# Patient Record
Sex: Male | Born: 1945 | Race: White | Hispanic: No | Smoking: Never smoker
Health system: Southern US, Community
[De-identification: ages and names within clinical notes are randomized; demographics above are authoritative.]

## PROBLEM LIST (undated history)

## (undated) DIAGNOSIS — J9621 Acute and chronic respiratory failure with hypoxia: Secondary | ICD-10-CM

## (undated) DIAGNOSIS — G909 Disorder of the autonomic nervous system, unspecified: Secondary | ICD-10-CM

## (undated) DIAGNOSIS — A419 Sepsis, unspecified organism: Secondary | ICD-10-CM

## (undated) DIAGNOSIS — G610 Guillain-Barre syndrome: Secondary | ICD-10-CM

## (undated) DIAGNOSIS — I480 Paroxysmal atrial fibrillation: Secondary | ICD-10-CM

## (undated) DIAGNOSIS — C61 Malignant neoplasm of prostate: Secondary | ICD-10-CM

## (undated) DIAGNOSIS — R6520 Severe sepsis without septic shock: Secondary | ICD-10-CM

## (undated) DIAGNOSIS — I509 Heart failure, unspecified: Secondary | ICD-10-CM

## (undated) DIAGNOSIS — I5022 Chronic systolic (congestive) heart failure: Secondary | ICD-10-CM

---

## 2020-07-17 ENCOUNTER — Other Ambulatory Visit (HOSPITAL_COMMUNITY): Payer: No Typology Code available for payment source

## 2020-07-17 ENCOUNTER — Inpatient Hospital Stay
Admission: RE | Admit: 2020-07-17 | Discharge: 2020-07-23 | Discharge status: Against Medical Advice | Payer: No Typology Code available for payment source | Attending: Internal Medicine | Admitting: Internal Medicine

## 2020-07-17 DIAGNOSIS — A419 Sepsis, unspecified organism: Secondary | ICD-10-CM | POA: Diagnosis present

## 2020-07-17 DIAGNOSIS — G909 Disorder of the autonomic nervous system, unspecified: Secondary | ICD-10-CM | POA: Diagnosis present

## 2020-07-17 DIAGNOSIS — G610 Guillain-Barre syndrome: Secondary | ICD-10-CM | POA: Diagnosis present

## 2020-07-17 DIAGNOSIS — Z931 Gastrostomy status: Secondary | ICD-10-CM

## 2020-07-17 DIAGNOSIS — R6520 Severe sepsis without septic shock: Secondary | ICD-10-CM | POA: Diagnosis present

## 2020-07-17 DIAGNOSIS — J90 Pleural effusion, not elsewhere classified: Secondary | ICD-10-CM

## 2020-07-17 DIAGNOSIS — J9690 Respiratory failure, unspecified, unspecified whether with hypoxia or hypercapnia: Secondary | ICD-10-CM

## 2020-07-17 DIAGNOSIS — J9621 Acute and chronic respiratory failure with hypoxia: Secondary | ICD-10-CM | POA: Diagnosis present

## 2020-07-17 DIAGNOSIS — I480 Paroxysmal atrial fibrillation: Secondary | ICD-10-CM | POA: Diagnosis present

## 2020-07-17 DIAGNOSIS — R6889 Other general symptoms and signs: Secondary | ICD-10-CM

## 2020-07-17 DIAGNOSIS — J9601 Acute respiratory failure with hypoxia: Secondary | ICD-10-CM | POA: Diagnosis present

## 2020-07-17 HISTORY — DX: Sepsis, unspecified organism: R65.20

## 2020-07-17 HISTORY — DX: Disorder of the autonomic nervous system, unspecified: G90.9

## 2020-07-17 HISTORY — DX: Guillain-Barre syndrome: G61.0

## 2020-07-17 HISTORY — DX: Paroxysmal atrial fibrillation: I48.0

## 2020-07-17 HISTORY — DX: Acute and chronic respiratory failure with hypoxia: J96.21

## 2020-07-17 HISTORY — DX: Sepsis, unspecified organism: A41.9

## 2020-07-18 DIAGNOSIS — G909 Disorder of the autonomic nervous system, unspecified: Secondary | ICD-10-CM | POA: Diagnosis not present

## 2020-07-18 DIAGNOSIS — I480 Paroxysmal atrial fibrillation: Secondary | ICD-10-CM | POA: Diagnosis not present

## 2020-07-18 DIAGNOSIS — J9621 Acute and chronic respiratory failure with hypoxia: Secondary | ICD-10-CM | POA: Diagnosis not present

## 2020-07-18 DIAGNOSIS — G610 Guillain-Barre syndrome: Secondary | ICD-10-CM | POA: Diagnosis not present

## 2020-07-18 LAB — CBC WITH DIFFERENTIAL/PLATELET
Abs Immature Granulocytes: 0.04 10*3/uL (ref 0.00–0.07)
Basophils Absolute: 0 10*3/uL (ref 0.0–0.1)
Basophils Relative: 1 %
Eosinophils Absolute: 0.3 10*3/uL (ref 0.0–0.5)
Eosinophils Relative: 5 %
HCT: 23.7 % — ABNORMAL LOW (ref 39.0–52.0)
Hemoglobin: 7.4 g/dL — ABNORMAL LOW (ref 13.0–17.0)
Immature Granulocytes: 1 %
Lymphocytes Relative: 25 %
Lymphs Abs: 1.4 10*3/uL (ref 0.7–4.0)
MCH: 29.1 pg (ref 26.0–34.0)
MCHC: 31.2 g/dL (ref 30.0–36.0)
MCV: 93.3 fL (ref 80.0–100.0)
Monocytes Absolute: 0.4 10*3/uL (ref 0.1–1.0)
Monocytes Relative: 7 %
Neutro Abs: 3.4 10*3/uL (ref 1.7–7.7)
Neutrophils Relative %: 61 %
Platelets: 189 10*3/uL (ref 150–400)
RBC: 2.54 MIL/uL — ABNORMAL LOW (ref 4.22–5.81)
RDW: 16.1 % — ABNORMAL HIGH (ref 11.5–15.5)
WBC: 5.5 10*3/uL (ref 4.0–10.5)
nRBC: 0 % (ref 0.0–0.2)

## 2020-07-18 LAB — TSH: TSH: 5.335 u[IU]/mL — ABNORMAL HIGH (ref 0.350–4.500)

## 2020-07-18 LAB — COMPREHENSIVE METABOLIC PANEL
ALT: 48 U/L — ABNORMAL HIGH (ref 0–44)
AST: 57 U/L — ABNORMAL HIGH (ref 15–41)
Albumin: 2.1 g/dL — ABNORMAL LOW (ref 3.5–5.0)
Alkaline Phosphatase: 54 U/L (ref 38–126)
Anion gap: 9 (ref 5–15)
BUN: 30 mg/dL — ABNORMAL HIGH (ref 8–23)
CO2: 24 mmol/L (ref 22–32)
Calcium: 7.9 mg/dL — ABNORMAL LOW (ref 8.9–10.3)
Chloride: 111 mmol/L (ref 98–111)
Creatinine, Ser: 0.8 mg/dL (ref 0.61–1.24)
GFR, Estimated: >60 mL/min (ref >60)
Glucose, Bld: 89 mg/dL (ref 70–99)
Potassium: 3.6 mmol/L (ref 3.5–5.1)
Sodium: 144 mmol/L (ref 135–145)
Total Bilirubin: 0.8 mg/dL (ref 0.3–1.2)
Total Protein: 5.1 g/dL — ABNORMAL LOW (ref 6.5–8.1)

## 2020-07-18 LAB — PROTIME-INR
INR: 1.2 (ref 0.8–1.2)
Prothrombin Time: 14.8 seconds (ref 11.4–15.2)

## 2020-07-18 LAB — HEMOGLOBIN A1C
Hgb A1c MFr Bld: 5.3 % (ref 4.8–5.6)
Mean Plasma Glucose: 105.41 mg/dL

## 2020-07-18 LAB — VANCOMYCIN, TROUGH
Vancomycin Tr: 29 ug/mL (ref 15–20)
Vancomycin Tr: 22 ug/mL (ref 15–20)

## 2020-07-18 LAB — PHOSPHORUS: Phosphorus: 2.9 mg/dL (ref 2.5–4.6)

## 2020-07-18 LAB — MAGNESIUM: Magnesium: 2.2 mg/dL (ref 1.7–2.4)

## 2020-07-18 NOTE — Consult Note (Signed)
Pulmonary Critical Care Medicine [facility] [place]  PULMONARY SERVICE  Date of Service: 07/18/2020  PULMONARY CRITICAL CARE CONSULT   Joseph Mayo  INO:[id number]  DOB: [birth year]   DOA: 07/17/2020  Referring Physician: Carron Curie, MD  HPI: Joseph Mayo is a 74 y.o. male seen for follow up of Acute on Chronic Respiratory Failure.  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  Patient at that time had been tested positive for COVID-19 on October 1 and had received MAB for treatment.  Patient's condition worsened with development of quadriparesis loss of DTRs and ended up having to be intubated.  Patient was transferred to [facility] neuro ICU on October 27.  The evaluation was done there by neurology.  Patient was given IVIG and also treated with Plex x7 treatments.  Patient did have some slight improvement noted based on the notes.  Had an echocardiogram done which the only report that I can find shows normal ejection fraction of 60 to 65%.  Patient did however have new onset of paroxysmal atrial fibrillation for which the patient was started on Coreg.  PEG tube was placed because of ongoing dysphagia.  The patient also became septic with MSSA and Serratia initially treated with vancomycin and Zosyn also was felt to have aspiration.  Subsequently it appears patient received meropenem as well as vancomycin follow-up.  Transferred to our facility for further weaning.  On initial evaluation he is off the ventilator on T collar has been on 35% FiO2.  He has a Bivona trach in place and looking through the charts from [place] it appears that the tracheostomy was done required division of the thyroid isthmus.  Currently he is doing fine the Bivona is sticking out quite a bit may need to change to a regular  Shiley we will discuss with the team during rounds and with ENT.  Review of Systems:  ROS performed and is unremarkable other than noted above.  Past Medical History:  Diagnosis Date  . A-fib (*) 06/25/2020  . Acute respiratory failure with hypoxia (*) 06/25/2020  . AKI (acute kidney injury) (*) 06/25/2020  . Bradycardia  . Elevated blood pressure reading without diagnosis of hypertension  . Essential hypertension 06/25/2020  . Hematuria  . Thrombocytopenia (*)   History reviewed. No pertinent surgical history.  No Known Allergies  Socioeconomic History  . Marital status: Married  Spouse name: Not on file  . Number of children: Not on file  . Years of education: Not on file  . Highest education level: Not on file  Occupational History  . Not on file  Tobacco Use  . Smoking status: Never Smoker  . Smokeless tobacco: Never Used  Vaping Use  . Vaping Use: Never used  Substance and Sexual Activity  . Alcohol use: Never  . Drug use: Never  . Sexual activity: Not on file  Other Topics Concern  . Not on file  Social History Narrative  . Not on file     Family History: Non-Contributory to the present illness    Medications: Reviewed on Rounds  Physical Exam:  Vitals: Temperature is 97.8 pulse 72 respiratory 14 blood pressure is 142/78 saturations 97%  Ventilator Settings off the ventilator on T collar with an FiO2 of 28%  . General: Comfortable at this time . Eyes: Grossly normal lids, irises & conjunctiva . ENT:  grossly tongue is normal . Neck: no obvious mass . Cardiovascular: S1-S2 normal no gallop or rub . Respiratory: No rhonchi no rales noted . Abdomen: Soft nontender . Skin: no rash seen on limited exam . Musculoskeletal: not rigid . Psychiatric:unable to assess . Neurologic: no seizure no involuntary movements         Labs on Admission:  Basic Metabolic Panel: Recent Labs  Lab 07/18/20 0555  MG 2.2  PHOS 2.9    No results for input(s): PHART,  PCO2ART, PO2ART, HCO3, O2SAT in the last 168 hours.  Liver Function Tests: No results for input(s): AST, ALT, ALKPHOS, BILITOT, PROT, ALBUMIN in the last 168 hours. No results for input(s): LIPASE, AMYLASE in the last 168 hours. No results for input(s): AMMONIA in the last 168 hours.  CBC: No results for input(s): WBC, NEUTROABS, HGB, HCT, MCV, PLT in the last 168 hours.  Cardiac Enzymes: No results for input(s): CKTOTAL, CKMB, CKMBINDEX, TROPONINI in the last 168 hours.  BNP (last 3 results) No results for input(s): BNP in the last 8760 hours.  ProBNP (last 3 results) No results for input(s): PROBNP in the last 8760 hours.   Radiological Exams on Admission: DG CHEST PORT 1 VIEW  Result Date: 07/17/2020 CLINICAL DATA:  74 year old male with respiratory failure. EXAM: PORTABLE CHEST 1 VIEW COMPARISON:  None. FINDINGS: Tracheostomy approximately 7 cm above the carina. There is cardiomegaly with vascular congestion and edema. Small bilateral pleural effusions with bibasilar atelectasis or infiltrate. No pneumothorax. No acute osseous pathology. IMPRESSION: 1. Cardiomegaly with CHF and small bilateral pleural effusions. 2. Tracheostomy above the carina. Electronically Signed   By: Elgie Collard M.D.   On: 07/17/2020 18:54   DG Abd Portable 1V  Result Date: 07/17/2020 CLINICAL DATA:  Percutaneous gastrostomy tube placement. EXAM: PORTABLE ABDOMEN - 1 VIEW COMPARISON:  None. FINDINGS: A percutaneous gastrostomy tube is seen with its distal end and insufflator bulb overlying the body of the stomach. The bowel gas pattern is normal. Radiopaque contrast is seen within the gastric lumen and proximal duodenum following injection into the previously noted gastrostomy tube. There is no evidence of contrast extravasation. No radio-opaque calculi or other significant radiographic abnormality are seen. IMPRESSION: Appropriate percutaneous gastrostomy tube positioning, as described above.  Electronically Signed   By: Aram Candela M.D.   On: 07/17/2020 21:37    Assessment/Plan Active Problems:   Acute on chronic respiratory failure with hypoxia (HCC)   Guillain Barr syndrome (HCC)   Paroxysmal atrial fibrillation (HCC)   Autonomic dysfunction   Severe sepsis (HCC)   1. Acute on chronic respiratory failure hypoxia patient remains off the ventilator on T collar currently on 28% FiO2 copious secretions are noted. 2. Atrial fibrillation with RVR rate is controlled we will continue to monitor. 3. 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  Completed 7 plasma exchanges 4. Autonomic dysfunction slow improvement we will continue to monitor patient's pressures closely 5. Severe sepsis recently treated for Serratia UTI and aspiration pneumonia we will continue to monitor  I have personally seen and evaluated the patient, evaluated laboratory and imaging results, formulated the assessment and plan and placed orders. The Patient requires high complexity decision making with multiple systems involvement.  Case was discussed on Rounds with the Respiratory Therapy Director and the Respiratory staff Time Spent  Yevonne Pax, MD [facility] Pulmonary Critical Care Medicine Sleep Medicine

## 2020-07-19 LAB — URINALYSIS, ROUTINE W REFLEX MICROSCOPIC
Bilirubin Urine: NEGATIVE
Glucose, UA: NEGATIVE mg/dL
Ketones, ur: NEGATIVE mg/dL
Leukocytes,Ua: NEGATIVE
Nitrite: NEGATIVE
Protein, ur: 100 mg/dL — AB
Specific Gravity, Urine: 1.016 (ref 1.005–1.030)
pH: 5 (ref 5.0–8.0)

## 2020-07-19 LAB — RENAL FUNCTION PANEL
Albumin: 2.1 g/dL — ABNORMAL LOW (ref 3.5–5.0)
Anion gap: 10 (ref 5–15)
BUN: 29 mg/dL — ABNORMAL HIGH (ref 8–23)
CO2: 26 mmol/L (ref 22–32)
Calcium: 8.1 mg/dL — ABNORMAL LOW (ref 8.9–10.3)
Chloride: 109 mmol/L (ref 98–111)
Creatinine, Ser: 0.74 mg/dL (ref 0.61–1.24)
GFR, Estimated: >60 mL/min (ref >60)
Glucose, Bld: 123 mg/dL — ABNORMAL HIGH (ref 70–99)
Phosphorus: 2.8 mg/dL (ref 2.5–4.6)
Potassium: 3.5 mmol/L (ref 3.5–5.1)
Sodium: 145 mmol/L (ref 135–145)

## 2020-07-19 LAB — VANCOMYCIN, TROUGH: Vancomycin Tr: 16 ug/mL (ref 15–20)

## 2020-07-19 LAB — CULTURE, RESPIRATORY W GRAM STAIN: Culture: NORMAL

## 2020-07-19 LAB — CBC
HCT: 24 % — ABNORMAL LOW (ref 39.0–52.0)
Hemoglobin: 7.5 g/dL — ABNORMAL LOW (ref 13.0–17.0)
MCH: 29.3 pg (ref 26.0–34.0)
MCHC: 31.3 g/dL (ref 30.0–36.0)
MCV: 93.8 fL (ref 80.0–100.0)
Platelets: 206 10*3/uL (ref 150–400)
RBC: 2.56 MIL/uL — ABNORMAL LOW (ref 4.22–5.81)
RDW: 15.9 % — ABNORMAL HIGH (ref 11.5–15.5)
WBC: 5.6 10*3/uL (ref 4.0–10.5)
nRBC: 0 % (ref 0.0–0.2)

## 2020-07-19 LAB — BLOOD GAS, ARTERIAL
Acid-Base Excess: 3.7 mmol/L — ABNORMAL HIGH (ref 0.0–2.0)
Bicarbonate: 27.1 mmol/L (ref 20.0–28.0)
FIO2: 60
O2 Saturation: 99.8 %
Patient temperature: 37.6
pCO2 arterial: 37.5 mmHg (ref 32.0–48.0)
pH, Arterial: 7.475 — ABNORMAL HIGH (ref 7.350–7.450)
pO2, Arterial: 207 mmHg — ABNORMAL HIGH (ref 83.0–108.0)

## 2020-07-19 LAB — MAGNESIUM: Magnesium: 2.1 mg/dL (ref 1.7–2.4)

## 2020-07-20 ENCOUNTER — Encounter: Payer: Self-pay | Admitting: Diagnostic Radiology

## 2020-07-20 DIAGNOSIS — J9601 Acute respiratory failure with hypoxia: Secondary | ICD-10-CM | POA: Diagnosis present

## 2020-07-20 DIAGNOSIS — R6520 Severe sepsis without septic shock: Secondary | ICD-10-CM

## 2020-07-20 DIAGNOSIS — G909 Disorder of the autonomic nervous system, unspecified: Secondary | ICD-10-CM | POA: Diagnosis present

## 2020-07-20 DIAGNOSIS — I480 Paroxysmal atrial fibrillation: Secondary | ICD-10-CM | POA: Diagnosis not present

## 2020-07-20 DIAGNOSIS — J9621 Acute and chronic respiratory failure with hypoxia: Secondary | ICD-10-CM | POA: Diagnosis present

## 2020-07-20 DIAGNOSIS — G610 Guillain-Barre syndrome: Secondary | ICD-10-CM | POA: Diagnosis present

## 2020-07-20 DIAGNOSIS — A419 Sepsis, unspecified organism: Secondary | ICD-10-CM

## 2020-07-20 LAB — BASIC METABOLIC PANEL
Anion gap: 7 (ref 5–15)
BUN: 26 mg/dL — ABNORMAL HIGH (ref 8–23)
CO2: 28 mmol/L (ref 22–32)
Calcium: 8.2 mg/dL — ABNORMAL LOW (ref 8.9–10.3)
Chloride: 111 mmol/L (ref 98–111)
Creatinine, Ser: 0.78 mg/dL (ref 0.61–1.24)
GFR, Estimated: >60 mL/min (ref >60)
Glucose, Bld: 118 mg/dL — ABNORMAL HIGH (ref 70–99)
Potassium: 4 mmol/L (ref 3.5–5.1)
Sodium: 146 mmol/L — ABNORMAL HIGH (ref 135–145)

## 2020-07-20 LAB — URINE CULTURE: Culture: NO GROWTH

## 2020-07-20 LAB — PHOSPHORUS: Phosphorus: 3.1 mg/dL (ref 2.5–4.6)

## 2020-07-20 LAB — CBC
HCT: 25 % — ABNORMAL LOW (ref 39.0–52.0)
Hemoglobin: 7.7 g/dL — ABNORMAL LOW (ref 13.0–17.0)
MCH: 28.9 pg (ref 26.0–34.0)
MCHC: 30.8 g/dL (ref 30.0–36.0)
MCV: 94 fL (ref 80.0–100.0)
Platelets: 211 10*3/uL (ref 150–400)
RBC: 2.66 MIL/uL — ABNORMAL LOW (ref 4.22–5.81)
RDW: 16 % — ABNORMAL HIGH (ref 11.5–15.5)
WBC: 6.3 10*3/uL (ref 4.0–10.5)
nRBC: 0 % (ref 0.0–0.2)

## 2020-07-20 LAB — MAGNESIUM: Magnesium: 2 mg/dL (ref 1.7–2.4)

## 2020-07-20 NOTE — Progress Notes (Signed)
Pulmonary Critical Care Medicine [facility] [place]   PULMONARY CRITICAL CARE SERVICE  PROGRESS NOTE  Date of Service: 07/20/2020  Joseph Mayo  [id number]:[id number]  DOB: 07-27-1946   DOA: 07/17/2020  Referring Physician: Carron Curie, MD  HPI: Joseph Mayo is a 74 y.o. male seen for follow up of Acute on Chronic Respiratory Failure.  Patient is off the ventilator right now on T-piece has been on 55% FiO2 secretions are still quite copious  Medications: Reviewed on Rounds  Physical Exam:  Vitals: Temperature 98.4 pulse 92 respiratory rate is 16 blood pressure is 136/79 saturations 94%  Ventilator Settings on T-piece with an FiO2 of 55%  . General: Comfortable at this time . Eyes: Grossly normal lids, irises & conjunctiva . ENT: grossly tongue is normal . Neck: no obvious mass . Cardiovascular: S1 S2 normal no gallop . Respiratory: Scattered rhonchi coarse breath sounds . Abdomen: soft . Skin: no rash seen on limited exam . Musculoskeletal: not rigid . Psychiatric:unable to assess . Neurologic: no seizure no involuntary movements         Lab Data:   Basic Metabolic Panel: Recent Labs  Lab 07/18/20 0555 07/18/20 0905 07/19/20 0310 07/20/20 0010  NA  --  144 145 146*  K  --  3.6 3.5 4.0  CL  --  111 109 111  CO2  --  24 26 28   GLUCOSE  --  89 123* 118*  BUN  --  30* 29* 26*  CREATININE  --  0.80 0.74 0.78  CALCIUM  --  7.9* 8.1* 8.2*  MG 2.2  --  2.1 2.0  PHOS 2.9  --  2.8 3.1    ABG: Recent Labs  Lab 07/19/20 0325  PHART 7.475*  PCO2ART 37.5  PO2ART 207*  HCO3 27.1  O2SAT 99.8    Liver Function Tests: Recent Labs  Lab 07/18/20 0905 07/19/20 0310  AST 57*  --   ALT 48*  --   ALKPHOS 54  --   BILITOT 0.8  --   PROT 5.1*  --   ALBUMIN 2.1* 2.1*   No results for input(s): LIPASE, AMYLASE in the last 168 hours. No results for input(s): AMMONIA in the last 168 hours.  CBC: Recent Labs  Lab 07/18/20 0905 07/19/20 0310  07/20/20 0010  WBC 5.5 5.6 6.3  NEUTROABS 3.4  --   --   HGB 7.4* 7.5* 7.7*  HCT 23.7* 24.0* 25.0*  MCV 93.3 93.8 94.0  PLT 189 206 211    Cardiac Enzymes: No results for input(s): CKTOTAL, CKMB, CKMBINDEX, TROPONINI in the last 168 hours.  BNP (last 3 results) No results for input(s): BNP in the last 8760 hours.  ProBNP (last 3 results) No results for input(s): PROBNP in the last 8760 hours.  Radiological Exams: No results found.  Assessment/Plan Active Problems:   Acute on chronic respiratory failure with hypoxia (HCC)   Guillain Barr syndrome (HCC)   Paroxysmal atrial fibrillation (HCC)   Autonomic dysfunction   Severe sepsis (HCC)   1. Acute on chronic respiratory failure with hypoxia at this time patient is on T-piece has been doing relatively well plan is going to be to continue to wean the oxygen down as tolerated. 2. Atrial fibrillation rate is controlled at this time we will continue to monitor 3. Guillain-Barr syndrome supportive care therapy as tolerated. 4. Autonomic dysfunction has been more stable this is secondary to the Guillain-Barr syndrome with slow improvement 5. Severe sepsis treated resolved  I have personally seen and evaluated the patient, evaluated laboratory and imaging results, formulated the assessment and plan and placed orders. The Patient requires high complexity decision making with multiple systems involvement.  Rounds were done with the Respiratory Therapy Director and Staff therapists and discussed with nursing staff also.  Allyne Gee, MD [facility] Pulmonary Critical Care Medicine Sleep Medicine

## 2020-07-21 ENCOUNTER — Other Ambulatory Visit (HOSPITAL_COMMUNITY): Payer: No Typology Code available for payment source

## 2020-07-21 DIAGNOSIS — J9621 Acute and chronic respiratory failure with hypoxia: Secondary | ICD-10-CM | POA: Diagnosis not present

## 2020-07-21 DIAGNOSIS — G610 Guillain-Barre syndrome: Secondary | ICD-10-CM | POA: Diagnosis not present

## 2020-07-21 DIAGNOSIS — G909 Disorder of the autonomic nervous system, unspecified: Secondary | ICD-10-CM | POA: Diagnosis not present

## 2020-07-21 DIAGNOSIS — I480 Paroxysmal atrial fibrillation: Secondary | ICD-10-CM | POA: Diagnosis not present

## 2020-07-21 LAB — SARS CORONAVIRUS 2 (TAT 6-24 HRS): SARS Coronavirus 2: NEGATIVE

## 2020-07-21 LAB — VITAMIN B12: Vitamin B-12: 335 pg/mL (ref 180–914)

## 2020-07-21 LAB — ECHOCARDIOGRAM COMPLETE
Area-P 1/2: 3.21 cm2
S' Lateral: 3.51 cm

## 2020-07-21 LAB — SODIUM: Sodium: 137 mmol/L (ref 135–145)

## 2020-07-21 LAB — BRAIN NATRIURETIC PEPTIDE: B Natriuretic Peptide: 310.8 pg/mL — ABNORMAL HIGH (ref 0.0–100.0)

## 2020-07-21 NOTE — Progress Notes (Signed)
Pulmonary Critical Care Medicine [facility] [place]   PULMONARY CRITICAL CARE SERVICE  PROGRESS NOTE  Date of Service: 07/21/2020  Joseph Mayo  LFY:[id number]  DOB: 22-Sep-1945   DOA: 07/17/2020  Referring Physician: Carron Curie, MD  HPI: Joseph Mayo is a 74 y.o. male seen for follow up of Acute on Chronic Respiratory Failure.  Comfortable right now on T collar has been on 35% FiO2 good saturations are noted.  Heart rate has been under control and FiO2 has been decreased from 55 to 35%  Medications: Reviewed on Rounds  Physical Exam:  Vitals: Temperature is 98.2 pulse 76 respiratory rate is 26 blood pressure is 152/83 saturations 100%  Ventilator Settings off the ventilator on T collar  . General: Comfortable at this time . Eyes: Grossly normal lids, irises & conjunctiva . ENT: grossly tongue is normal . Neck: no obvious mass . Cardiovascular: S1 S2 normal no gallop . Respiratory: No rhonchi coarse breath sounds . Abdomen: soft . Skin: no rash seen on limited exam . Musculoskeletal: not rigid . Psychiatric:unable to assess . Neurologic: no seizure no involuntary movements         Lab Data:   Basic Metabolic Panel: Recent Labs  Lab 07/18/20 0555 07/18/20 0905 07/19/20 0310 07/20/20 0010 07/21/20 0543  NA  --  144 145 146* 137  K  --  3.6 3.5 4.0  --   CL  --  111 109 111  --   CO2  --  24 26 28   --   GLUCOSE  --  89 123* 118*  --   BUN  --  30* 29* 26*  --   CREATININE  --  0.80 0.74 0.78  --   CALCIUM  --  7.9* 8.1* 8.2*  --   MG 2.2  --  2.1 2.0  --   PHOS 2.9  --  2.8 3.1  --     ABG: Recent Labs  Lab 07/19/20 0325  PHART 7.475*  PCO2ART 37.5  PO2ART 207*  HCO3 27.1  O2SAT 99.8    Liver Function Tests: Recent Labs  Lab 07/18/20 0905 07/19/20 0310  AST 57*  --   ALT 48*  --   ALKPHOS 54  --   BILITOT 0.8  --   PROT 5.1*  --   ALBUMIN 2.1* 2.1*   No results for input(s): LIPASE, AMYLASE in the last 168  hours. No results for input(s): AMMONIA in the last 168 hours.  CBC: Recent Labs  Lab 07/18/20 0905 07/19/20 0310 07/20/20 0010  WBC 5.5 5.6 6.3  NEUTROABS 3.4  --   --   HGB 7.4* 7.5* 7.7*  HCT 23.7* 24.0* 25.0*  MCV 93.3 93.8 94.0  PLT 189 206 211    Cardiac Enzymes: No results for input(s): CKTOTAL, CKMB, CKMBINDEX, TROPONINI in the last 168 hours.  BNP (last 3 results) Recent Labs    07/21/20 0543  BNP 310.8*    ProBNP (last 3 results) No results for input(s): PROBNP in the last 8760 hours.  Radiological Exams: DG CHEST PORT 1 VIEW  Result Date: 07/21/2020 CLINICAL DATA:  Pleural effusion.  Increased oxygen demand EXAM: PORTABLE CHEST 1 VIEW COMPARISON:  Four days ago FINDINGS: Tracheostomy tube in place. Symmetric hazy opacification of the bilateral lower chest. No visible air leak. Normal heart size. IMPRESSION: Unchanged hazy opacification of the bilateral lower chest, usually atelectasis and pleural effusions. Electronically Signed   By: 07/23/2020 M.D.   On:  07/21/2020 06:37    Assessment/Plan Active Problems:   Acute on chronic respiratory failure with hypoxia (HCC)   Guillain Barr syndrome (HCC)   Paroxysmal atrial fibrillation (HCC)   Autonomic dysfunction   Severe sepsis (HCC)   1. Acute on chronic respiratory failure hypoxia plan is to continue with the weaning process patient is doing quite well FiO2 has been decreased to 35%.  The patient tracheostomy will be changed we will monitor closely. 2. Guillain-Barr syndrome supportive care therapy as tolerated 3. Paroxysmal atrial fibrillation rate is controlled we will continue to monitor. 4. Autonomic dysfunction patient's hemodynamics are better controlled 5. Severe sepsis resolved   I have personally seen and evaluated the patient, evaluated laboratory and imaging results, formulated the assessment and plan and placed orders. The Patient requires high complexity decision making with multiple  systems involvement.  Rounds were done with the Respiratory Therapy Director and Staff therapists and discussed with nursing staff also.  Yevonne Pax, MD [facility] Pulmonary Critical Care Medicine Sleep Medicine

## 2020-07-21 NOTE — Progress Notes (Signed)
  Echocardiogram 2D Echocardiogram has been performed.  Joseph Mayo G Devonne Lalani 07/21/2020, 2:51 PM

## 2020-07-22 ENCOUNTER — Encounter (HOSPITAL_BASED_OUTPATIENT_CLINIC_OR_DEPARTMENT_OTHER): Payer: No Typology Code available for payment source

## 2020-07-22 DIAGNOSIS — M7989 Other specified soft tissue disorders: Secondary | ICD-10-CM

## 2020-07-22 DIAGNOSIS — G610 Guillain-Barre syndrome: Secondary | ICD-10-CM | POA: Diagnosis not present

## 2020-07-22 DIAGNOSIS — I480 Paroxysmal atrial fibrillation: Secondary | ICD-10-CM | POA: Diagnosis not present

## 2020-07-22 DIAGNOSIS — J9621 Acute and chronic respiratory failure with hypoxia: Secondary | ICD-10-CM | POA: Diagnosis not present

## 2020-07-22 DIAGNOSIS — G909 Disorder of the autonomic nervous system, unspecified: Secondary | ICD-10-CM | POA: Diagnosis not present

## 2020-07-22 DIAGNOSIS — J9620 Acute and chronic respiratory failure, unspecified whether with hypoxia or hypercapnia: Secondary | ICD-10-CM

## 2020-07-22 LAB — CBC
HCT: 28.5 % — ABNORMAL LOW (ref 39.0–52.0)
Hemoglobin: 8.8 g/dL — ABNORMAL LOW (ref 13.0–17.0)
MCH: 28.9 pg (ref 26.0–34.0)
MCHC: 30.9 g/dL (ref 30.0–36.0)
MCV: 93.4 fL (ref 80.0–100.0)
Platelets: 236 10*3/uL (ref 150–400)
RBC: 3.05 MIL/uL — ABNORMAL LOW (ref 4.22–5.81)
RDW: 15.7 % — ABNORMAL HIGH (ref 11.5–15.5)
WBC: 7.4 10*3/uL (ref 4.0–10.5)
nRBC: 0 % (ref 0.0–0.2)

## 2020-07-22 LAB — BASIC METABOLIC PANEL
Anion gap: 9 (ref 5–15)
BUN: 22 mg/dL (ref 8–23)
CO2: 27 mmol/L (ref 22–32)
Calcium: 8.5 mg/dL — ABNORMAL LOW (ref 8.9–10.3)
Chloride: 104 mmol/L (ref 98–111)
Creatinine, Ser: 0.64 mg/dL (ref 0.61–1.24)
GFR, Estimated: >60 mL/min (ref >60)
Glucose, Bld: 113 mg/dL — ABNORMAL HIGH (ref 70–99)
Potassium: 4.1 mmol/L (ref 3.5–5.1)
Sodium: 140 mmol/L (ref 135–145)

## 2020-07-22 LAB — MAGNESIUM: Magnesium: 2 mg/dL (ref 1.7–2.4)

## 2020-07-22 NOTE — Progress Notes (Signed)
IR received request for thoracentesis. The patient's daughter Mc Bloodworth was contacted to obtain consent. Rhoda stated she does NOT give consent and she is requesting a second opinion about the necessity of this procedure. The ordering provider was notified of this request.  No IR procedure planned and the order will be deleted.  Please call IR with any questions/concerns.  Alwyn Ren, [place] [phone] 07/22/2020, 1:01 PM

## 2020-07-22 NOTE — Progress Notes (Signed)
Pulmonary Critical Care Medicine [facility] [place]   PULMONARY CRITICAL CARE SERVICE  PROGRESS NOTE  Date of Service: 07/22/2020  Joseph Mayo  WYO:[id number]  DOB: Aug 19, 1946   DOA: 07/17/2020  Referring Physician: Carron Curie, MD  HPI: Joseph Mayo is a 74 y.o. male seen for follow up of Acute on Chronic Respiratory Failure.  Patient remains on long T collar has been on 30% FiO2 FiO2 has been gradually decreased  Medications: Reviewed on Rounds  Physical Exam:  Vitals: Temperature 98.2 pulse 73 respiratory rate 22 blood pressure is 172/90 saturations 99%  Ventilator Settings off the ventilator on T collar with an FiO2 of 30%  . General: Comfortable at this time . Eyes: Grossly normal lids, irises & conjunctiva . ENT: grossly tongue is normal . Neck: no obvious mass . Cardiovascular: S1 S2 normal no gallop . Respiratory: No rhonchi very coarse breath sounds . Abdomen: soft . Skin: no rash seen on limited exam . Musculoskeletal: not rigid . Psychiatric:unable to assess . Neurologic: no seizure no involuntary movements         Lab Data:   Basic Metabolic Panel: Recent Labs  Lab 07/18/20 0555 07/18/20 0905 07/19/20 0310 07/20/20 0010 07/21/20 0543 07/22/20 0359  NA  --  144 145 146* 137 140  K  --  3.6 3.5 4.0  --  4.1  CL  --  111 109 111  --  104  CO2  --  24 26 28   --  27  GLUCOSE  --  89 123* 118*  --  113*  BUN  --  30* 29* 26*  --  22  CREATININE  --  0.80 0.74 0.78  --  0.64  CALCIUM  --  7.9* 8.1* 8.2*  --  8.5*  MG 2.2  --  2.1 2.0  --  2.0  PHOS 2.9  --  2.8 3.1  --   --     ABG: Recent Labs  Lab 07/19/20 0325  PHART 7.475*  PCO2ART 37.5  PO2ART 207*  HCO3 27.1  O2SAT 99.8    Liver Function Tests: Recent Labs  Lab 07/18/20 0905 07/19/20 0310  AST 57*  --   ALT 48*  --   ALKPHOS 54  --   BILITOT 0.8  --   PROT 5.1*  --   ALBUMIN 2.1* 2.1*   No results for input(s): LIPASE, AMYLASE in the last 168  hours. No results for input(s): AMMONIA in the last 168 hours.  CBC: Recent Labs  Lab 07/18/20 0905 07/19/20 0310 07/20/20 0010 07/22/20 0359  WBC 5.5 5.6 6.3 7.4  NEUTROABS 3.4  --   --   --   HGB 7.4* 7.5* 7.7* 8.8*  HCT 23.7* 24.0* 25.0* 28.5*  MCV 93.3 93.8 94.0 93.4  PLT 189 206 211 236    Cardiac Enzymes: No results for input(s): CKTOTAL, CKMB, CKMBINDEX, TROPONINI in the last 168 hours.  BNP (last 3 results) Recent Labs    07/21/20 0543  BNP 310.8*    ProBNP (last 3 results) No results for input(s): PROBNP in the last 8760 hours.  Radiological Exams: DG CHEST PORT 1 VIEW  Result Date: 07/21/2020 CLINICAL DATA:  Pleural effusion.  Increased oxygen demand EXAM: PORTABLE CHEST 1 VIEW COMPARISON:  Four days ago FINDINGS: Tracheostomy tube in place. Symmetric hazy opacification of the bilateral lower chest. No visible air leak. Normal heart size. IMPRESSION: Unchanged hazy opacification of the bilateral lower chest, usually atelectasis and pleural  effusions. Electronically Signed   By: Marnee Spring M.D.   On: 07/21/2020 06:37   ECHOCARDIOGRAM COMPLETE  Result Date: 07/21/2020    ECHOCARDIOGRAM REPORT   Patient Name:   Joseph Mayo Date of Exam: 07/21/2020 Medical Rec #:  [id number]          Height: Accession #:    [id number]         Weight: Date of Birth:  04-28-1946           BSA: Patient Age:    74 years           BP:           143/98 mmHg Patient Gender: M                  HR:           73 bpm. 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                  Arrythmias:Atrial Fibrillation. Sepsis. Guillain Barre                  Syndrome.  Sonographer:     Elmarie Shiley Dance Referring Phys:  [id number] PRIYA VARGHESE Diagnosing Phys: Orpah Cobb MD  Sonographer Comments: Echo performed with  patient supine and on artificial respirator and no parasternal window. IMPRESSIONS  1. Left ventricular ejection fraction, by estimation, is 45 to 50%. The left ventricle has mildly decreased function. The left ventricle demonstrates regional wall motion abnormalities (see scoring diagram/findings for description). Left ventricular diastolic parameters are consistent with Grade I diastolic dysfunction (impaired relaxation).  2. Right ventricular systolic function is normal. The right ventricular size is normal.  3. Left atrial size was moderately dilated.  4. The mitral valve is degenerative. Mild mitral valve regurgitation.  5. The aortic valve is tricuspid. Aortic valve regurgitation is not visualized. Mild aortic valve sclerosis is present, with no evidence of aortic valve stenosis.  6. The inferior vena cava is normal in size with <50% respiratory variability, suggesting right atrial pressure of 8 mmHg. FINDINGS  Left Ventricle: Left ventricular ejection fraction, by estimation, is 45 to 50%. The left ventricle has mildly decreased function. The left ventricle demonstrates regional wall motion abnormalities. Mild hypokinesis of the left ventricular, mid-apical anterior wall. The left ventricular internal cavity size was small. There is no left ventricular hypertrophy. Left ventricular diastolic parameters are consistent with Grade I diastolic dysfunction (impaired relaxation). Right Ventricle: The right ventricular size is normal. No increase in right ventricular wall thickness. Right ventricular systolic function is normal. Left Atrium: Left atrial size was moderately dilated. Right Atrium: Right atrial size was normal in size. Pericardium: There is no evidence of pericardial effusion. Mitral Valve: The mitral valve is degenerative in appearance. Mild mitral valve regurgitation. Tricuspid Valve: The tricuspid valve is normal in structure. Tricuspid valve regurgitation is trivial. Aortic Valve: The aortic valve  is tricuspid. Aortic valve regurgitation is not visualized. Mild aortic valve sclerosis is present, with no evidence of aortic valve stenosis. Pulmonic Valve: The pulmonic valve was normal in structure. Pulmonic valve regurgitation is not visualized. Aorta: The aortic root is normal in size and structure. There is minimal (Grade I) atheroma plaque involving the aortic root. Venous: The inferior vena  cava is normal in size with less than 50% respiratory variability, suggesting right atrial pressure of 8 mmHg. IAS/Shunts: The interatrial septum was not assessed. Additional Comments: There is a small pleural effusion in the right lateral region.  LEFT VENTRICLE PLAX 2D LVIDd:         4.64 cm  Diastology LVIDs:         3.51 cm  LV e' medial:    9.03 cm/s LV PW:         0.95 cm  LV E/e' medial:  9.0 LV IVS:        1.08 cm  LV e' lateral:   9.90 cm/s LVOT diam:     1.80 cm  LV E/e' lateral: 8.2 LV SV:         46 LVOT Area:     2.54 cm  RIGHT VENTRICLE             IVC RV Basal diam:  1.80 cm     IVC diam: 1.97 cm RV S prime:     11.40 cm/s TAPSE (M-mode): 2.2 cm LEFT ATRIUM             RIGHT ATRIUM LA diam:        3.50 cm RA Area:     9.61 cm LA Vol (A2C):   59.9 ml RA Volume:   19.50 ml LA Vol (A4C):   69.1 ml LA Biplane Vol: 66.0 ml  AORTIC VALVE LVOT Vmax:   77.60 cm/s LVOT Vmean:  50.500 cm/s LVOT VTI:    0.182 m  AORTA Ao Root diam: 3.70 cm MITRAL VALVE MV Area (PHT): 3.21 cm    SHUNTS MV Decel Time: 236 msec    Systemic VTI:  0.18 m MV E velocity: 81.00 cm/s  Systemic Diam: 1.80 cm MV A velocity: 72.80 cm/s MV E/A ratio:  1.11 Orpah Cobb MD Electronically signed by Orpah Cobb MD Signature Date/Time: 07/21/2020/3:15:59 PM    Final     Assessment/Plan Active Problems:   Acute on chronic respiratory failure with hypoxia (HCC)   Guillain Barr syndrome (HCC)   Paroxysmal atrial fibrillation (HCC)   Autonomic dysfunction   Severe sepsis (HCC)   1. Acute on chronic respiratory failure hypoxia we will  continue with T collar trials patient is on 30% FiO2. 2. Guillain-Barr syndrome patient still with significant weakness continue with therapy as tolerated. 3. Paroxysmal atrial fibrillation rate controlled right now on the echocardiogram that was done did show reduction in ejection fraction below 50% awaiting final report 4. Autonomic dysfunction right now appears to be stable from a blood pressure perspective 5. Severe sepsis this is resolved   I have personally seen and evaluated the patient, evaluated laboratory and imaging results, formulated the assessment and plan and placed orders. The Patient requires high complexity decision making with multiple systems involvement.  Rounds were done with the Respiratory Therapy Director and Staff therapists and discussed with nursing staff also.  Yevonne Pax, MD [facility] Pulmonary Critical Care Medicine Sleep Medicine

## 2020-07-23 ENCOUNTER — Other Ambulatory Visit (HOSPITAL_COMMUNITY): Payer: No Typology Code available for payment source

## 2020-07-23 DIAGNOSIS — G909 Disorder of the autonomic nervous system, unspecified: Secondary | ICD-10-CM | POA: Diagnosis not present

## 2020-07-23 DIAGNOSIS — J9621 Acute and chronic respiratory failure with hypoxia: Secondary | ICD-10-CM | POA: Diagnosis not present

## 2020-07-23 DIAGNOSIS — G610 Guillain-Barre syndrome: Secondary | ICD-10-CM | POA: Diagnosis not present

## 2020-07-23 DIAGNOSIS — I480 Paroxysmal atrial fibrillation: Secondary | ICD-10-CM | POA: Diagnosis not present

## 2020-07-23 NOTE — Progress Notes (Signed)
Pulmonary Critical Care Medicine [facility]   PULMONARY CRITICAL CARE SERVICE  PROGRESS NOTE  Date of Service: 07/23/2020  [name]  ZOX:[id number]  DOB: [birth year]   DOA: 07/17/2020  Referring Physician: Carron Curie, MD  HPI: Joseph Mayo is a 74 y.o. male seen for follow up of Acute on Chronic Respiratory Failure.  This morning patient looks much better had the tracheostomy changed out and is doing better he is on T collar and is on 28% FiO2.  Has been able to verbalize but is going to start using the PMV hopefully today.  Yesterday the patient was supposed to have a thoracentesis done for right-sided pleural effusion which was felt could improve his chances of weaning successfully and also for diagnostic purposes.  Patient's daughter [name] was contacted by IR for consent and the daughter did not give consent to the requesting practitioner so therefore the procedure was not done.  The daughter wanted a second opinion.  The primary care provider who has been taking care of the patient called several facilities about possibly transferring the patient for another opinion however there are basically no beds available in the [place] area and also the receiving facility's stated that the patient is not a candidate for transfer to short-term acute care at this stage.  Medications: Reviewed on Rounds  Physical Exam:  Vitals: Temperature is 98.0 pulse 88 respiratory rate 26 blood pressure is 166/92 saturations are 96%  Ventilator Settings off the ventilator on T collar FiO2 of 28%  . General: Comfortable at this time . Eyes: Grossly normal lids, irises & conjunctiva . ENT: grossly tongue is normal . Neck: no obvious mass . Cardiovascular: S1 S2 normal no gallop . Respiratory: Scattered rhonchi no rales diminished right base . Abdomen: soft . Skin: no rash seen on limited exam . Musculoskeletal: not rigid . Psychiatric:unable to assess . Neurologic: no  seizure no involuntary movements         Lab Data:   Basic Metabolic Panel: Recent Labs  Lab 07/18/20 0555 07/18/20 0905 07/19/20 0310 07/20/20 0010 07/21/20 0543 07/22/20 0359  NA  --  144 145 146* 137 140  K  --  3.6 3.5 4.0  --  4.1  CL  --  111 109 111  --  104  CO2  --  --  27  GLUCOSE  --  89 123* 118*  --  113*  BUN  --  30* 29* 26*  --  22  CREATININE  --  0.80 0.74 0.78  --  0.64  CALCIUM  --  7.9* 8.1* 8.2*  --  8.5*  MG 2.2  --  2.1 2.0  --  2.0  PHOS 2.9  --  2.8 3.1  --   --     ABG: Recent Labs  Lab 07/19/20 0325  PHART 7.475*  PCO2ART 37.5  PO2ART 207*  HCO3 27.1  O2SAT 99.8    Liver Function Tests: Recent Labs  Lab 07/18/20 0905 07/19/20 0310  AST 57*  --   ALT 48*  --   ALKPHOS 54  --   BILITOT 0.8  --   PROT 5.1*  --   ALBUMIN 2.1* 2.1*   No results for input(s): LIPASE, AMYLASE in the last 168 hours. No results for input(s): AMMONIA in the last 168 hours.  CBC: Recent Labs  Lab 07/18/20 0905 07/19/20 0310 07/20/20 0010 07/22/20 0359  WBC 5.5 5.6 6.3 7.4  NEUTROABS  3.4  --   --   --   HGB 7.4* 7.5* 7.7* 8.8*  HCT 23.7* 24.0* 25.0* 28.5*  MCV 93.3 93.8 94.0 93.4  PLT 189 206 211 236    Cardiac Enzymes: No results for input(s): CKTOTAL, CKMB, CKMBINDEX, TROPONINI in the last 168 hours.  BNP (last 3 results) Recent Labs    07/21/20 0543  BNP 310.8*    ProBNP (last 3 results) No results for input(s): PROBNP in the last 8760 hours.  Radiological Exams: ECHOCARDIOGRAM COMPLETE  Result Date: 07/21/2020    ECHOCARDIOGRAM REPORT   Patient Name:   [name] Date of Exam: 07/21/2020 Medical Rec #:  [id number]          Height: Accession #:    [id number]         Weight: Date of Birth:  April 13, 1946           BSA: Patient Age:    74 years           BP:           143/98 mmHg Patient Gender: M                  HR:           73 bpm. 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                  Arrythmias:Atrial Fibrillation. Sepsis. Guillain Barre                  Syndrome.  Sonographer:     Elmarie Shiley Dance Referring Phys:  [id number] PRIYA VARGHESE Diagnosing Phys: Orpah Cobb MD  Sonographer Comments: Echo performed with patient supine and on artificial respirator and no parasternal window. IMPRESSIONS  1. Left ventricular ejection fraction, by estimation, is 45 to 50%. The left ventricle has mildly decreased function. The left ventricle demonstrates regional wall motion abnormalities (see scoring diagram/findings for description). Left ventricular diastolic parameters are consistent with Grade I diastolic dysfunction (impaired relaxation).  2. Right ventricular systolic function is normal. The right ventricular size is normal.  3. Left atrial size was moderately dilated.  4. The mitral valve is degenerative. Mild mitral valve regurgitation.  5. The aortic valve is tricuspid. Aortic valve regurgitation is not visualized. Mild aortic valve sclerosis is present, with no evidence of aortic valve stenosis.  6. The inferior vena cava is normal in size with <50% respiratory variability, suggesting right atrial pressure of 8 mmHg. FINDINGS  Left Ventricle: Left ventricular ejection fraction, by estimation, is 45 to 50%. The left ventricle has mildly decreased function. The left ventricle demonstrates regional wall motion abnormalities. Mild hypokinesis of the left ventricular, mid-apical anterior wall. The left ventricular internal cavity size was small. There is no left ventricular hypertrophy. Left ventricular diastolic parameters are consistent with Grade I diastolic dysfunction (impaired relaxation). Right Ventricle: The right ventricular size is normal. No increase in right ventricular wall thickness. Right ventricular systolic function is  normal. Left Atrium: Left atrial size was moderately dilated. Right Atrium: Right atrial size was normal in size. Pericardium: There is no evidence of pericardial effusion. Mitral Valve: The mitral valve is degenerative in appearance. Mild mitral valve regurgitation. Tricuspid Valve: The tricuspid valve is normal in structure. Tricuspid  valve regurgitation is trivial. Aortic Valve: The aortic valve is tricuspid. Aortic valve regurgitation is not visualized. Mild aortic valve sclerosis is present, with no evidence of aortic valve stenosis. Pulmonic Valve: The pulmonic valve was normal in structure. Pulmonic valve regurgitation is not visualized. Aorta: The aortic root is normal in size and structure. There is minimal (Grade I) atheroma plaque involving the aortic root. Venous: The inferior vena cava is normal in size with less than 50% respiratory variability, suggesting right atrial pressure of 8 mmHg. IAS/Shunts: The interatrial septum was not assessed. Additional Comments: There is a small pleural effusion in the right lateral region.  LEFT VENTRICLE PLAX 2D LVIDd:         4.64 cm  Diastology LVIDs:         3.51 cm  LV e' medial:    9.03 cm/s LV PW:         0.95 cm  LV E/e' medial:  9.0 LV IVS:        1.08 cm  LV e' lateral:   9.90 cm/s LVOT diam:     1.80 cm  LV E/e' lateral: 8.2 LV SV:         46 LVOT Area:     2.54 cm  RIGHT VENTRICLE             IVC RV Basal diam:  1.80 cm     IVC diam: 1.97 cm RV S prime:     11.40 cm/s TAPSE (M-mode): 2.2 cm LEFT ATRIUM             RIGHT ATRIUM LA diam:        3.50 cm RA Area:     9.61 cm LA Vol (A2C):   59.9 ml RA Volume:   19.50 ml LA Vol (A4C):   69.1 ml LA Biplane Vol: 66.0 ml  AORTIC VALVE LVOT Vmax:   77.60 cm/s LVOT Vmean:  50.500 cm/s LVOT VTI:    0.182 m  AORTA Ao Root diam: 3.70 cm MITRAL VALVE MV Area (PHT): 3.21 cm    SHUNTS MV Decel Time: 236 msec    Systemic VTI:  0.18 m MV E velocity: 81.00 cm/s  Systemic Diam: 1.80 cm MV A velocity: 72.80 cm/s MV E/A  ratio:  1.11 Orpah Cobb MD Electronically signed by Orpah Cobb MD Signature Date/Time: 07/21/2020/3:15:59 PM    Final    VAS [place] LOWER EXTREMITY VENOUS (DVT)  Result Date: 07/22/2020  Lower Venous DVT Study Indications: Swelling.  Comparison Study: No prior studies. Performing Technologist: Jean Rosenthal, RDMS  Examination Guidelines: A complete evaluation includes B-mode imaging, spectral Doppler, color Doppler, and power Doppler as needed of all accessible portions of each vessel. Bilateral testing is considered an integral part of a complete examination. Limited examinations for reoccurring indications may be performed as noted. The reflux portion of the exam is performed with the patient in reverse Trendelenburg.  +-----+---------------+---------+-----------+----------+--------------+ RIGHTCompressibilityPhasicitySpontaneityPropertiesThrombus Aging +-----+---------------+---------+-----------+----------+--------------+ CFV  Full           Yes      Yes                                 +-----+---------------+---------+-----------+----------+--------------+   +---------+---------------+---------+-----------+----------+--------------+ LEFT     CompressibilityPhasicitySpontaneityPropertiesThrombus Aging +---------+---------------+---------+-----------+----------+--------------+ CFV      Full           Yes      Yes                                 +---------+---------------+---------+-----------+----------+--------------+  SFJ      Full                                                        +---------+---------------+---------+-----------+----------+--------------+ FV Prox  Full                                                        +---------+---------------+---------+-----------+----------+--------------+ FV Mid   Full                                                        +---------+---------------+---------+-----------+----------+--------------+ FV DistalFull                                                         +---------+---------------+---------+-----------+----------+--------------+ PFV      Full                                                        +---------+---------------+---------+-----------+----------+--------------+ POP      Full           Yes      Yes                                 +---------+---------------+---------+-----------+----------+--------------+ PTV      Full                                                        +---------+---------------+---------+-----------+----------+--------------+ PERO     Full                                                        +---------+---------------+---------+-----------+----------+--------------+     Summary: RIGHT: - No evidence of common femoral vein obstruction.  LEFT: - There is no evidence of deep vein thrombosis in the lower extremity.  - No cystic structure found in the popliteal fossa.  *See table(s) above for measurements and observations. Electronically signed by Coral Else MD on 07/22/2020 at 8:56:13 PM.    Final    VAS [place] UPPER EXTREMITY VENOUS DUPLEX  Result Date: 07/22/2020 UPPER VENOUS STUDY  Indications: Swelling Comparison Study: No prior. Performing Technologist: Jean Rosenthal, RDMS  Examination Guidelines: A complete evaluation includes B-mode imaging, spectral Doppler, color Doppler, and power Doppler as needed of all accessible portions  of each vessel. Bilateral testing is considered an integral part of a complete examination. Limited examinations for reoccurring indications may be performed as noted.  Right Findings: +----------+------------+---------+-----------+----------+-------+ RIGHT     CompressiblePhasicitySpontaneousPropertiesSummary +----------+------------+---------+-----------+----------+-------+ Subclavian               Yes       Yes                      +----------+------------+---------+-----------+----------+-------+  Left  Findings: +----------+------------+---------+-----------+----------+-------+ LEFT      CompressiblePhasicitySpontaneousPropertiesSummary +----------+------------+---------+-----------+----------+-------+ IJV           Full       Yes       Yes                      +----------+------------+---------+-----------+----------+-------+ Subclavian    Full       Yes       Yes                      +----------+------------+---------+-----------+----------+-------+ Axillary      Full       Yes       Yes                      +----------+------------+---------+-----------+----------+-------+ Brachial      Full                                          +----------+------------+---------+-----------+----------+-------+ Radial        Full                                          +----------+------------+---------+-----------+----------+-------+ Ulnar         Full                                          +----------+------------+---------+-----------+----------+-------+ Cephalic    Partial                                         +----------+------------+---------+-----------+----------+-------+ Basilic     Partial                                         +----------+------------+---------+-----------+----------+-------+  Summary:  Right: No evidence of thrombosis in the subclavian.  Left: Findings consistent with age indeterminate superficial vein thrombosis involving the left cephalic vein and left basilic vein.  *See table(s) above for measurements and observations.  Diagnosing physician: Coral ElseVance Brabham MD Electronically signed by Coral ElseVance Brabham MD on 07/22/2020 at 8:52:50 PM.    Final     Assessment/Plan Active Problems:   Acute on chronic respiratory failure with hypoxia (HCC)   Guillain Barr syndrome (HCC)   Paroxysmal atrial fibrillation (HCC)   Autonomic dysfunction   Severe sepsis (HCC)   1. Acute on chronic respiratory failure with hypoxia patient is  doing well after the trach change.  He is able to verbalize around the trach but I explained to the  daughter that it will be better to place the PMV so he can speak normally.  Hopefully he will continue to gain strength and we can continue with the weaning process. 2. Guillain-Barr syndrome slow improvement therapy as tolerated. 3. Paroxysmal atrial fibrillation good control right now with heart rate in the 80s 4. Autonomic dysfunction stable we will continue to monitor 5. Severe sepsis has resolved 6. Pleural effusion as noted above his daughter did not give consent for thoracentesis wanting another opinion and we have not been able to obtain another opinion at another facility spoke with primary care team will get a follow-up chest x-ray ordered   I have personally seen and evaluated the patient, evaluated laboratory and imaging results, formulated the assessment and plan and placed orders. The Patient requires high complexity decision making with multiple systems involvement.  Rounds were done with the Respiratory Therapy Director and Staff therapists and discussed with nursing staff also.  Yevonne Pax, MD [facility] Pulmonary Critical Care Medicine Sleep Medicine

## 2020-11-04 ENCOUNTER — Other Ambulatory Visit: Payer: Self-pay

## 2020-11-04 ENCOUNTER — Encounter: Payer: Self-pay | Admitting: Physical Therapy

## 2020-11-04 ENCOUNTER — Ambulatory Visit: Payer: No Typology Code available for payment source | Attending: Family Medicine | Admitting: Physical Therapy

## 2020-11-04 DIAGNOSIS — R209 Unspecified disturbances of skin sensation: Secondary | ICD-10-CM | POA: Diagnosis present

## 2020-11-04 DIAGNOSIS — R29818 Other symptoms and signs involving the nervous system: Secondary | ICD-10-CM | POA: Diagnosis present

## 2020-11-04 DIAGNOSIS — M6281 Muscle weakness (generalized): Secondary | ICD-10-CM | POA: Insufficient documentation

## 2020-11-04 DIAGNOSIS — R262 Difficulty in walking, not elsewhere classified: Secondary | ICD-10-CM | POA: Insufficient documentation

## 2020-11-04 DIAGNOSIS — R293 Abnormal posture: Secondary | ICD-10-CM | POA: Diagnosis present

## 2020-11-04 NOTE — Therapy (Addendum)
[facility] [address] [place], [place], [address] Phone: [phone]   Fax:  [phone]  Physical Therapy Evaluation  Patient Details  Name: Joseph Mayo MRN: [id number] Date of Birth: 04-Apr-1946 Referring Provider (PT): Joseph Mayo   Encounter Date: 11/04/2020   PT End of Session - 11/04/20 1711    Visit Number 1    Number of Visits 16    Authorization Type VA    Authorization - Number of Visits 15    PT Start Time 1400    PT Stop Time 1448    PT Time Calculation (min) 48 min    Equipment Utilized During Treatment Gait belt    Activity Tolerance Patient tolerated treatment well    Behavior During Therapy [facility] for tasks assessed/performed           Past Medical History:  Diagnosis Date  . Acute on chronic respiratory failure with hypoxia (HCC)   . Autonomic dysfunction   . Guillain Barr syndrome (HCC)   . Paroxysmal atrial fibrillation (HCC)   . Severe sepsis (HCC)     History reviewed. No pertinent surgical history.  There were no vitals filed for this visit.    Subjective Assessment - 11/04/20 1403    Subjective Joseph Mayo is Mayo 75 y.o. male that was diagnosed with [name] on 06/17/20. Required trach/PEG following COVID infection and discharged on 07/15/20 to [facility]. Pt newly diagnosed with atrial fibrillation during that admission.Prior to that pt was fully independent, very active, no daily prescription meds.   Pt admitted from [facility] on 12/2 with acute on chronic resp failure 2/2 B pleural effusions and + PE now therapeutic on Heparin and s/p B thoracentesis. Discharged home december 18th with HH through the [place]. Received OT/PT and ended about Mayo month ago, got it for Mayo couple months. 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 Can now do Mayo slideboard transfer. Pt's daughter reports he stands at the sink with assistance -  about 1-2 minutes at Mayo time (does this occassionally). Follows up with the neurologist at the [place]. Going to see his PCP next Friday.    Patient is accompained by: Family member   daughter Joseph Mayo, wife Joseph Mayo   Pertinent History GBS, Mayo fib, hx of covid    Limitations Standing;Walking;Sitting    How long can you stand comfortably? about 1-2 minutes at the countertop with daughter assisting.    Patient Stated Goals wants to get up and walk.    Currently in Pain? Yes   feels uncomfortable, legs feel heavy, feet send up tremors             [facility] PT Assessment - 11/04/20 1413      Assessment   Medical Diagnosis GBS    Referring Provider (PT) Mayo, Joseph Mayo    Onset Date/Surgical Date 06/17/20    Hand Dominance Right    Prior Therapy acute care, HHPT and OT for Mayo couple months      Precautions   Precautions Fall      Balance Screen   Has the patient fallen in the past 6 months No    Has the patient had Mayo decrease in activity level because of Mayo fear of falling?  No    Is the patient reluctant to leave their home because of Mayo fear of falling?  No      Home Environment   Living  Environment Private residence    Living Arrangements Spouse/significant other;Children   pt's wife and pts daughter (other siblings take turns)   Available Help at Discharge Family    Type of Home House    Home Access Ramped entrance    Home Layout One level    Home Equipment Wheelchair - manual;Hospital bed;Shower seat;Bedside commode    Additional Comments has to be hoyer lifted into shower chair. has Mayo BSC at home, unable to use it at home due to feeling uncomfortable      Prior Function   Level of Independence Independent    Leisure gardening, used to enjoy cycling      Sensation   Light Touch Impaired Detail   reports more tingling in B feet   Proprioception Impaired Detail    Proprioception Impaired Details Impaired LLE   could detect 3/5 ankle DF/PF at L ankle, could detect on R ankle   Additional  Comments tingling in B feet      Coordination   Gross Motor Movements are Fluid and Coordinated No    Heel Shin Test unable to perform due to weakness      Posture/Postural Control   Posture/Postural Control Postural limitations    Postural Limitations Posterior pelvic tilt;Rounded Shoulders      ROM / Strength   AROM / PROM / Strength Strength;AROM      AROM   Overall AROM Comments decr 2/2 to weakness      Strength   Overall Strength Comments pt able to perform seated hip ABD in sitting (unable to hold against resistance), can perform seated ADD against min resistance b    Strength Assessment Site Hip;Knee;Ankle    Right/Left Hip Left;Right    Right Hip Flexion 2/5    Right Hip ABduction --    Right Hip ADduction --    Left Hip Flexion 2/5    Left Hip ABduction --    Left Hip ADduction --    Right/Left Knee Right;Left    Right Knee Flexion 3-/5    Right Knee Extension 2+/5    Left Knee Flexion 3-/5    Left Knee Extension 2/5    Right/Left Ankle Left;Right    Right Ankle Dorsiflexion 1/5    Right Ankle Plantar Flexion 2-/5    Left Ankle Dorsiflexion 1/5    Left Ankle Plantar Flexion 2-/5      Bed Mobility   Bed Mobility Supine to Sit;Sit to Supine    Supine to Sit Minimal Assistance - Patient > 75%   for BLE, used chair to mimic bed rail   Sit to Supine Minimal Assistance - Patient > 75%   for help with BLE onto mat table     Transfers   Transfers Sit to Stand;Stand to Sit;Lateral/Scoot Transfers    Sit to Stand 3: Mod assist   +2   Sit to Stand Details (indicate cue type and reason) at countertop from w/c - pulling up with BUE on sink, therapist assisting pt first with proper BLE position, mod Mayo x2 (PT and pt's daughter also performing), pt with incr forward flexed posture in standing, needing assist from therapist to make sure pt did not bump head on cabinets    Stand to Sit 3: Mod assist    Stand to Sit Details uncontrolled descent    Lateral/Scoot Transfers With  slide board;5: Supervision    Lateral/Scoot Transfer Details (indicate cue type and reason) set up assistance with placing sliding board  Comments pt able to stand at countertop x15 seconds with mOD Mayo      Ambulation/Gait   Gait Comments not safe at this time.      Balance   Balance Assessed Yes      Static Sitting Balance   Static Sitting - Balance Support No upper extremity supported;Feet supported    Static Sitting - Level of Assistance 6: Modified independent (Device/Increase time)      Dynamic Sitting Balance   Dynamic Sitting - Balance Support No upper extremity supported    Dynamic Sitting - Level of Assistance 5: Stand by assistance    Dynamic Sitting balance - Comments seated at edge of mat, reaching outside of BOS x2 reps to R and x2 reps to L (towards target)                      Objective measurements completed on examination: See above findings.               PT Education - 11/04/20 1710    Education Details clinical findings, POC, potential OT eval for UE weakness/ADLs (will think about it at this time), [facility] giving 15 visits at  Mayo time    Person(s) Educated Patient;Spouse;Child(ren)    Methods Explanation    Comprehension Verbalized understanding            PT Short Term Goals - 11/05/20 0940      PT SHORT TERM GOAL #1   Title Pt and pt's caregiver will be independent with initial HEP in order to build upon functional gains made in therapy. ALL STGS DUE 12/03/20    Time 4    Period Weeks    Status New    Target Date 12/03/20      PT SHORT TERM GOAL #2   Title Pt will be able to perform Mayo sliding board transfer with mod I in order to demo improved independence.    Baseline supervision with set up assist of sliding board.    Time 4    Period Weeks    Status New      PT SHORT TERM GOAL #3   Title Pt will tolerate standing in Stedy/ at the sink for Mayo least 2 minutes with min Mayo in order to demo improved standing tolerance.     Baseline 15 seconds at sink with mod Mayo    Time 4    Period Weeks    Status New      PT SHORT TERM GOAL #4   Title Pt will perform sit <> stand at countertop/Stedy with min Mayo x1 in order to demo improved functional BLE strength.    Time 4    Period Weeks    Status New      PT SHORT TERM GOAL #5   Title Pt will perform bed mobility with CGA in order to decr caregiver burden.    Baseline min Mayo for supine <> sit    Time 4    Period Weeks    Status New             PT Long Term Goals - 11/05/20 0944      PT LONG TERM GOAL #1   Title Pt and pt's caregiver will be independent with final HEP in order to build upon functional gains made in therapy. ALL LTGS DUE 12/31/20    Time 8    Period Weeks    Status New    Target Date  12/31/20      PT LONG TERM GOAL #2   Title Pt will be able to perform squat pivot transfer with CGA in order to demo improved transfers for decr caregiver burden.    Time 8    Period Weeks    Status New      PT LONG TERM GOAL #3   Title Pt will stand at [facility] for at least 3 minutes with min Mayo in order to demo improved functional BLE strength and standing tolerance.    Time 8    Period Weeks    Status New      PT LONG TERM GOAL #4   Title Pt will perform sit <> stand with RW with min Mayo in order to demo improved strength for transfers.    Time 8    Period Weeks    Status New      PT LONG TERM GOAL #5   Title Pt will ambulate at least 5' in // bars with min Mayo in order to progress towards functional household mobility.    Baseline unable.    Time 8    Period Weeks    Status New                  Plan - 11/05/20 0948    Clinical Impression Statement Patient is Mayo 75 year old male referred to Neuro OPPT for GBS. Diagnosed with [name] on 06/17/20 after COVID infection. Pt discharged to [facility] on 07/15/20. Pt admitted from [facility] on 12/2 with acute on chronic resp failure. Pt then discharged home on December 18th with HH through the [place]. Received  HH for approx. 2 months with PT/OT. Pt now able to do sliding board transfers with supervision and set up assist, able to stand at sink with mod Mayo x2 and able to stand for 15 seconds. Pt requires min Mayo for BLE advancement with bed mobility. Prior to diagnosis pt was very active and into cycling.  The following deficits were present during the exam:  decreased strength, postural impairments,  impaired sensation, decr functional ability for transfers, decr standing tolerance, decr balance, decr ROM, impaired proprioception. Pt would benefit from skilled PT to address these impairments and functional limitations to maximize functional mobility independence    Personal Factors and Comorbidities Comorbidity 3+;Past/Current Experience   pt very active prior to diagnosis   Comorbidities GBS, Mayo fib, hx of covid.    Examination-Activity Limitations Bathing;Bed Mobility;Caring for [employer];Locomotion Level;Dressing;Hygiene/Grooming;Transfers;Stand;Toileting    Examination-Participation Restrictions Cleaning;Community Activity;Yard Work;Meal Prep;Shop;Laundry;Driving;Occupation   exercise, cycling   Stability/Clinical Decision Making Evolving/Moderate complexity    Clinical Decision Making Moderate    Rehab Potential Good    PT Frequency 2x / week   1-2x Mayo week   PT Duration --   7-8 weeks   PT Treatment/Interventions Manual techniques;Therapeutic exercise;Gait training;Neuromuscular re-education;Aquatic Therapy;Orthotic Fit/Training    PT Next Visit Plan initial HEP for supine strengthening with caregiver support. dynamic sitting balance. standing in standing frame/in Stedy.    Consulted and Agree with Plan of Care Patient;Family member/caregiver    Family Member Consulted pt's wife and daughter.           Patient will benefit from skilled therapeutic intervention in order to improve the following deficits and impairments:  Decreased activity tolerance,Decreased coordination,Decreased  balance,Decreased endurance,Decreased range of motion,Decreased strength,Difficulty walking,Impaired sensation,Postural dysfunction  Visit Diagnosis: Difficulty in walking, not elsewhere classified  Muscle weakness (generalized)  Unspecified disturbances of skin sensation  Other  symptoms and signs involving the nervous system  Abnormal posture     Problem List Patient Active Problem List   Diagnosis Date Noted  . Acute on chronic respiratory failure with hypoxia (HCC)   . Guillain Barr syndrome (HCC)   . Paroxysmal atrial fibrillation (HCC)   . Autonomic dysfunction   . Severe sepsis (HCC)     Joseph Mayo, PT, DPT  11/05/2020, 9:57 AM  [facility] [address] [place], [place], [address] Phone: [phone]   Fax:  [phone]  Name: Joseph Mayo MRN: [id number] Date of Birth: 10/30/1945

## 2020-11-11 ENCOUNTER — Ambulatory Visit: Payer: No Typology Code available for payment source | Admitting: Physical Therapy

## 2020-11-11 ENCOUNTER — Encounter: Payer: Self-pay | Admitting: Physical Therapy

## 2020-11-11 ENCOUNTER — Other Ambulatory Visit: Payer: Self-pay

## 2020-11-11 DIAGNOSIS — R29818 Other symptoms and signs involving the nervous system: Secondary | ICD-10-CM

## 2020-11-11 DIAGNOSIS — R262 Difficulty in walking, not elsewhere classified: Secondary | ICD-10-CM

## 2020-11-11 DIAGNOSIS — M6281 Muscle weakness (generalized): Secondary | ICD-10-CM

## 2020-11-11 DIAGNOSIS — R293 Abnormal posture: Secondary | ICD-10-CM

## 2020-11-11 NOTE — Patient Instructions (Signed)
Access Code: [email or web address] URL: [email or web address] Date: 11/11/2020 Prepared by: Sherlie Ban  Exercises Supine Quadricep Sets - 1-2 x daily - 5 x weekly - 2 sets - 10 reps Supine Bridge - 1-2 x daily - 5 x weekly - 2 sets - 10 reps Supine Hip Adduction Isometric with Ball - 1 x daily - 5 x weekly - 1-2 sets - 10 reps Seated Heel Slide - 1-2 x daily - 5 x weekly - 1 sets - 10 reps Seated Hip Abduction with Resistance - 2 x daily - 5 x weekly - 1 sets - 10 reps

## 2020-11-11 NOTE — Therapy (Signed)
[facility] [address] [place], [place], [address] Phone: [phone]   Fax:  [phone]  Physical Therapy Treatment  Patient Details  Name: Joseph Mayo MRN: [id number] Date of Birth: 03-27-1946 Referring Provider (PT): Jacky Kindle A   Encounter Date: 11/11/2020   PT End of Session - 11/12/20 1006    Visit Number 2    Number of Visits 16    Authorization Type VA    Authorization - Visit Number 1    Authorization - Number of Visits 15    PT Start Time 1532    PT Stop Time 1618    PT Time Calculation (min) 46 min    Equipment Utilized During Treatment Gait belt    Activity Tolerance Patient tolerated treatment well    Behavior During Therapy WFL for tasks assessed/performed           Past Medical History:  Diagnosis Date  . Acute on chronic respiratory failure with hypoxia (HCC)   . Autonomic dysfunction   . Guillain Barr syndrome (HCC)   . Paroxysmal atrial fibrillation (HCC)   . Severe sepsis (HCC)     History reviewed. No pertinent surgical history.  There were no vitals filed for this visit.   Subjective Assessment - 11/11/20 1535    Subjective No changes since he was last here.    Patient is accompained by: Family member   daughter Bjorn Loser, wife Lupita Leash   Pertinent History GBS, a fib, hx of covid    Limitations Standing;Walking;Sitting    How long can you stand comfortably? about 1-2 minutes at the countertop with daughter assisting.    Patient Stated Goals wants to get up and walk.    Currently in Pain? No/denies                           Access Code: [email or web address] URL: [email or web address] Date: 11/11/2020 Prepared by: Sherlie Ban  Initiated HEP for strengthening/ROM. See MedBridge for more details.   Exercises Supine Quadricep Sets - 1-2 x daily - 5 x weekly - 2 sets - 10 reps - verbal/tactile cues for proper technique  Supine Bridge - 1-2 x  daily - 5 x weekly - 2 sets - 10 reps - cues to hold in position for 2 seconds (limited range) Supine Hip Adduction Isometric with Ball - 1 x daily - 5 x weekly - 1-2 sets - 10 reps Seated Heel Slide - 1-2 x daily - 5 x weekly - 1 sets - 10 reps - with use of pillowcase/towel to decr friction, incr difficulty performing with RLE  Seated Hip Abduction with Resistance - 2 x daily - 5 x weekly - 1 sets - 10 reps - with use of yellow tband. Keeping one leg still and bringing one leg out and in.   [facility] Adult PT Treatment/Exercise - 11/12/20 1011      Transfers   Sit to Stand 3: Mod assist    Sit to Stand Details (indicate cue type and reason) to [facility] from elevated mat table x1 rep, then standing for 15 seconds with incr forward flexed posture and difficulty pressing up onto the bars    Stand to Sit 3: Mod assist    Stand to Sit Details from Stedy to elevated mat table.    Comments performed one partial sit > stand from [place] with min A, pt then able to stand for approx. 10-15 seconds, pt with leaning over //  bars when standing      Exercises   Exercises Other Exercises    Other Exercises  supine heel slides on mat table (pt's daughter reports they have been performing at home) - educated on potential use of sliding board under foot to make it slide more easily (they report they are using carfboard), supine bent knee fall outs with yellow tband x10 reps B, SAQs with BLE over bolster x5 reps B with assist from therapist (pt lacking ROM and strength, esp with LLE)                 PT Education - 11/12/20 1006    Education Details initial HEP for strengthening    Person(s) Educated Patient;Spouse;Child(ren)    Methods Explanation;Demonstration;Handout    Comprehension Verbalized understanding;Returned demonstration            PT Short Term Goals - 11/05/20 0940      PT SHORT TERM GOAL #1   Title Pt and pt's caregiver will be independent with initial HEP in order to build upon  functional gains made in therapy. ALL STGS DUE 12/03/20    Time 4    Period Weeks    Status New    Target Date 12/03/20      PT SHORT TERM GOAL #2   Title Pt will be able to perform a sliding board transfer with mod I in order to demo improved independence.    Baseline supervision with set up assist of sliding board.    Time 4    Period Weeks    Status New      PT SHORT TERM GOAL #3   Title Pt will tolerate standing in Stedy/ at the sink for a least 2 minutes with min A in order to demo improved standing tolerance.    Baseline 15 seconds at sink with mod A    Time 4    Period Weeks    Status New      PT SHORT TERM GOAL #4   Title Pt will perform sit <> stand at countertop/Stedy with min A x1 in order to demo improved functional BLE strength.    Time 4    Period Weeks    Status New      PT SHORT TERM GOAL #5   Title Pt will perform bed mobility with CGA in order to decr caregiver burden.    Baseline min A for supine <> sit    Time 4    Period Weeks    Status New             PT Long Term Goals - 11/05/20 0944      PT LONG TERM GOAL #1   Title Pt and pt's caregiver will be independent with final HEP in order to build upon functional gains made in therapy. ALL LTGS DUE 12/31/20    Time 8    Period Weeks    Status New    Target Date 12/31/20      PT LONG TERM GOAL #2   Title Pt will be able to perform squat pivot transfer with CGA in order to demo improved transfers for decr caregiver burden.    Time 8    Period Weeks    Status New      PT LONG TERM GOAL #3   Title Pt will stand at [facility] for at least 3 minutes with min A in order to demo improved functional BLE strength and standing tolerance.  Time 8    Period Weeks    Status New      PT LONG TERM GOAL #4   Title Pt will perform sit <> stand with RW with min A in order to demo improved strength for transfers.    Time 8    Period Weeks    Status New      PT LONG TERM GOAL #5   Title Pt will ambulate at least  5' in // bars with min A in order to progress towards functional household mobility.    Baseline unable.    Time 8    Period Weeks    Status New                 Plan - 11/12/20 1204    Clinical Impression Statement Today's skilled session focused on initiating HEP for BLE strengthening in supine and in sitting position. Pt tolerated session well. Needed AAROM from therapist for SAQs due to weakness (did not add to HEP at this time). Performed standing balance in Stedy at end of session (pt able to stand for approx. 15 seconds and with incr forward flexed posture). Per pt's daughter, reports that pt had been standing at countertop for 2-5 minutes at a time previously. Pt reporting incr calf tightness during standing. Will continue to progress towards LTGs.    Personal Factors and Comorbidities Comorbidity 3+;Past/Current Experience   pt very active prior to diagnosis   Comorbidities GBS, a fib, hx of covid.    Examination-Activity Limitations Bathing;Bed Mobility;Caring for [employer];Locomotion Level;Dressing;Hygiene/Grooming;Transfers;Stand;Toileting    Examination-Participation Restrictions Cleaning;Community Activity;Yard Work;Meal Prep;Shop;Laundry;Driving;Occupation   exercise, cycling   Stability/Clinical Decision Making Evolving/Moderate complexity    Rehab Potential Good    PT Frequency 2x / week   1-2x a week   PT Duration --   7-8 weeks   PT Treatment/Interventions Manual techniques;Therapeutic exercise;Gait training;Neuromuscular re-education;Aquatic Therapy;Orthotic Fit/Training    PT Next Visit Plan strengthening for BLE. add ankle DF stretch. dynamic sitting balance. standing in standing frame/in Stedy.    Consulted and Agree with Plan of Care Patient;Family member/caregiver    Family Member Consulted pt's wife and daughter.           Patient will benefit from skilled therapeutic intervention in order to improve the following deficits and impairments:   Decreased activity tolerance,Decreased coordination,Decreased balance,Decreased endurance,Decreased range of motion,Decreased strength,Difficulty walking,Impaired sensation,Postural dysfunction  Visit Diagnosis: Difficulty in walking, not elsewhere classified  Muscle weakness (generalized)  Other symptoms and signs involving the nervous system  Abnormal posture     Problem List Patient Active Problem List   Diagnosis Date Noted  . Acute on chronic respiratory failure with hypoxia (HCC)   . Guillain Barr syndrome (HCC)   . Paroxysmal atrial fibrillation (HCC)   . Autonomic dysfunction   . Severe sepsis [facility])     Catalina Gravel, DPT  11/12/2020, 12:07 PM  [facility] [address] [place], [place], [address] Phone: [phone]   Fax:  [phone]  Name: CYREE CHUONG MRN: [id number] Date of Birth: 09-20-1945

## 2020-11-12 NOTE — Addendum Note (Signed)
Addended by: Drake Leach on: 11/12/2020 10:09 AM   Modules accepted: Orders

## 2020-11-16 ENCOUNTER — Other Ambulatory Visit: Payer: Self-pay

## 2020-11-16 ENCOUNTER — Encounter: Payer: Self-pay | Admitting: Physical Therapy

## 2020-11-16 ENCOUNTER — Ambulatory Visit: Payer: No Typology Code available for payment source | Admitting: Physical Therapy

## 2020-11-16 DIAGNOSIS — R29818 Other symptoms and signs involving the nervous system: Secondary | ICD-10-CM

## 2020-11-16 DIAGNOSIS — R262 Difficulty in walking, not elsewhere classified: Secondary | ICD-10-CM | POA: Diagnosis not present

## 2020-11-16 DIAGNOSIS — R293 Abnormal posture: Secondary | ICD-10-CM

## 2020-11-16 DIAGNOSIS — M6281 Muscle weakness (generalized): Secondary | ICD-10-CM

## 2020-11-16 NOTE — Patient Instructions (Signed)
Access Code: [email or web address] URL: [email or web address] Date: 11/16/2020 Prepared by: Sherlie Ban  Exercises Supine Quadricep Sets - 1-2 x daily - 5 x weekly - 2 sets - 10 reps Supine Bridge - 1-2 x daily - 5 x weekly - 2 sets - 10 reps Supine Hip Adduction Isometric with Ball - 1 x daily - 5 x weekly - 1-2 sets - 10 reps Seated Heel Slide - 1-2 x daily - 5 x weekly - 1 sets - 10 reps Seated Hip Abduction with Resistance - 2 x daily - 5 x weekly - 1 sets - 10 reps Clamshell - 1 x daily - 5 x weekly - 1-2 sets - 10 reps Seated Calf Stretch with Strap - 1 x daily - 5 x weekly - 3 sets - 20-30 hold

## 2020-11-16 NOTE — Therapy (Signed)
[facility] [address] [place], [place], [address] Phone: [phone]   Fax:  [phone]  Physical Therapy Treatment  Patient Details  Name: Joseph Mayo MRN: [id number] Date of Birth: 1946/02/18 Referring Provider (PT): Jacky Kindle A   Encounter Date: 11/16/2020   PT End of Session - 11/16/20 1500    Visit Number 3    Number of Visits 16    Authorization Type [facility]    Authorization - Visit Number 2    Authorization - Number of Visits 15    PT Start Time 1403    PT Stop Time 1445    PT Time Calculation (min) 42 min    Equipment Utilized During Treatment Gait belt    Activity Tolerance Patient tolerated treatment well    Behavior During Therapy WFL for tasks assessed/performed           Past Medical History:  Diagnosis Date  . Acute on chronic respiratory failure with hypoxia (HCC)   . Autonomic dysfunction   . Guillain Barr syndrome (HCC)   . Paroxysmal atrial fibrillation (HCC)   . Severe sepsis (HCC)     History reviewed. No pertinent surgical history.  There were no vitals filed for this visit.   Subjective Assessment - 11/16/20 1405    Subjective No changes since he was last here. Tried the exercises at home and they went well. Saw the doctor last week and got really dizzy - reports his vitals were fine. Otherwise, MD appointment went well, states that the doctor is pleased with his progress.    Patient is accompained by: Family member   Joseph Mayo, Joseph Mayo   Pertinent History GBS, a fib, hx of covid    Limitations Standing;Walking;Sitting    How long can you stand comfortably? about 1-2 minutes at the countertop with Joseph assisting.    Patient Stated Goals wants to get up and walk.    Currently in Pain? No/denies                             [facility] Adult PT Treatment/Exercise - 11/16/20 0001      Transfers   Transfers Lateral/Scoot Transfers     Lateral/Scoot Transfers 5: Supervision    Lateral/Scoot Transfer Details (indicate cue type and reason) without use of sliding board from w/c <> mat table      Exercises   Exercises Knee/Hip    Other Exercises  x10 reps B ankle pumps in supine      Knee/Hip Exercises: Stretches   Gastroc Stretch Both;2 reps;30 seconds    Gastroc Stretch Limitations seated at edge of mat table, with use of belt, therapist assisting foot in belt      Knee/Hip Exercises: Aerobic   Other Aerobic Pt seated in w/c with anti tipper blocks behind wheels performed at gear 1.2 for 5 minutes for ROM, strengthening, and activity tolerance with BLE and BUE - last 45 seconds with BLE only      Knee/Hip Exercises: Supine   Short Arc [employer] Strengthening;AAROM;Both;10 reps    Short Arc [employer] Limitations with bolster under legs, therapist assisting for full ROM    Bridges Strengthening;AROM;2 sets;5 reps    Bridges Limitations manual and verbal cues for ROM    Other Supine Knee/Hip Exercises knee and hip flexion with therapist providing manual resistance and pt kicking out into knee/hip extension x10 reps each leg  Knee/Hip Exercises: Sidelying   Clams 2 x 10 reps B with cues for proper positioning of legs, first set with no resitance 2nd set with yellow tband                  PT Education - 11/16/20 1500    Education Details calf stretch and clamshells to HEP    Person(s) Educated Patient;Spouse;Child(ren)    Methods Explanation;Demonstration;Handout    Comprehension Verbalized understanding;Returned demonstration            PT Short Term Goals - 11/05/20 0940      PT SHORT TERM GOAL #1   Title Pt and pt's caregiver will be independent with initial HEP in order to build upon functional gains made in therapy. ALL STGS DUE 12/03/20    Time 4    Period Weeks    Status New    Target Date 12/03/20      PT SHORT TERM GOAL #2   Title Pt will be able to perform a sliding board transfer with  mod I in order to demo improved independence.    Baseline supervision with set up assist of sliding board.    Time 4    Period Weeks    Status New      PT SHORT TERM GOAL #3   Title Pt will tolerate standing in Stedy/ at the sink for a least 2 minutes with min A in order to demo improved standing tolerance.    Baseline 15 seconds at sink with mod A    Time 4    Period Weeks    Status New      PT SHORT TERM GOAL #4   Title Pt will perform sit <> stand at countertop/Stedy with min A x1 in order to demo improved functional BLE strength.    Time 4    Period Weeks    Status New      PT SHORT TERM GOAL #5   Title Pt will perform bed mobility with CGA in order to decr caregiver burden.    Baseline min A for supine <> sit    Time 4    Period Weeks    Status New             PT Long Term Goals - 11/05/20 0944      PT LONG TERM GOAL #1   Title Pt and pt's caregiver will be independent with final HEP in order to build upon functional gains made in therapy. ALL LTGS DUE 12/31/20    Time 8    Period Weeks    Status New    Target Date 12/31/20      PT LONG TERM GOAL #2   Title Pt will be able to perform squat pivot transfer with CGA in order to demo improved transfers for decr caregiver burden.    Time 8    Period Weeks    Status New      PT LONG TERM GOAL #3   Title Pt will stand at [facility] for at least 3 minutes with min A in order to demo improved functional BLE strength and standing tolerance.    Time 8    Period Weeks    Status New      PT LONG TERM GOAL #4   Title Pt will perform sit <> stand with RW with min A in order to demo improved strength for transfers.    Time 8    Period Weeks  Status New      PT LONG TERM GOAL #5   Title Pt will ambulate at least 5' in // bars with min A in order to progress towards functional household mobility.    Baseline unable.    Time 8    Period Weeks    Status New                 Plan - 11/16/20 1502    Clinical  Impression Statement Today's skilled session continued to focus on BLE strengthening - on mat table and on SciFit. Pt tolerated session well, pt with incr weakness in distal musculature. Pt able to perform lateral scoot transfer without use of sliding board with supervision. Will continue to progress towards LTGs.    Personal Factors and Comorbidities Comorbidity 3+;Past/Current Experience   pt very active prior to diagnosis   Comorbidities GBS, a fib, hx of covid.    Examination-Activity Limitations Bathing;Bed Mobility;Caring for [employer];Locomotion Level;Dressing;Hygiene/Grooming;Transfers;Stand;Toileting    Examination-Participation Restrictions Cleaning;Community Activity;Yard Work;Meal Prep;Shop;Laundry;Driving;Occupation   exercise, cycling   Stability/Clinical Decision Making Evolving/Moderate complexity    Rehab Potential Good    PT Frequency 2x / week   1-2x a week   PT Duration --   7-8 weeks   PT Treatment/Interventions Manual techniques;Therapeutic exercise;Gait training;Neuromuscular re-education;Aquatic Therapy;Orthotic Fit/Training    PT Next Visit Plan strengthening for BLE - supine/seated. seated SciFit (with pt staying in w/c). dynamic sitting balance. standing in standing frame/in Stedy.    Consulted and Agree with Plan of Care Patient;Family member/caregiver    Family Member Consulted pt's Joseph and Joseph.           Patient will benefit from skilled therapeutic intervention in order to improve the following deficits and impairments:  Decreased activity tolerance,Decreased coordination,Decreased balance,Decreased endurance,Decreased range of motion,Decreased strength,Difficulty walking,Impaired sensation,Postural dysfunction  Visit Diagnosis: Difficulty in walking, not elsewhere classified  Muscle weakness (generalized)  Other symptoms and signs involving the nervous system  Abnormal posture     Problem List Patient Active Problem List   Diagnosis  Date Noted  . Acute on chronic respiratory failure with hypoxia (HCC)   . Guillain Barr syndrome (HCC)   . Paroxysmal atrial fibrillation (HCC)   . Autonomic dysfunction   . Severe sepsis (HCC)     Drake Leach, PT, DPT  11/16/2020, 3:03 PM  [facility] [address] [place], [place], [address] Phone: [phone]   Fax:  [phone]  Name: Joseph Mayo MRN: [id number] Date of Birth: [birth year]

## 2020-11-18 ENCOUNTER — Encounter: Payer: Self-pay | Admitting: Physical Therapy

## 2020-11-18 ENCOUNTER — Ambulatory Visit: Payer: No Typology Code available for payment source | Admitting: Physical Therapy

## 2020-11-18 ENCOUNTER — Other Ambulatory Visit: Payer: Self-pay

## 2020-11-18 DIAGNOSIS — R293 Abnormal posture: Secondary | ICD-10-CM

## 2020-11-18 DIAGNOSIS — R29818 Other symptoms and signs involving the nervous system: Secondary | ICD-10-CM

## 2020-11-18 DIAGNOSIS — R262 Difficulty in walking, not elsewhere classified: Secondary | ICD-10-CM | POA: Diagnosis not present

## 2020-11-18 DIAGNOSIS — M6281 Muscle weakness (generalized): Secondary | ICD-10-CM

## 2020-11-19 NOTE — Therapy (Signed)
[facility] [address] [place], [place], [address] Phone: [phone]   Fax:  [phone]  Physical Therapy Treatment  Patient Details  Name: Joseph Mayo MRN: [id number] Date of Birth: 10/17/1945 Referring Provider (PT): Jacky Kindle A   Encounter Date: 11/18/2020   PT End of Session - 11/18/20 1454    Visit Number 4    Number of Visits 16    Authorization Type [facility]    Authorization - Visit Number 3    Authorization - Number of Visits 15    PT Start Time 1448    PT Stop Time 1530    PT Time Calculation (min) 42 min    Equipment Utilized During Treatment Gait belt    Activity Tolerance Patient tolerated treatment well    Behavior During Therapy [facility] for tasks assessed/performed           Past Medical History:  Diagnosis Date  . Acute on chronic respiratory failure with hypoxia (HCC)   . Autonomic dysfunction   . Guillain Barr syndrome (HCC)   . Paroxysmal atrial fibrillation (HCC)   . Severe sepsis (HCC)     History reviewed. No pertinent surgical history.  There were no vitals filed for this visit.   Subjective Assessment - 11/18/20 1452    Subjective No new complaints. No falls or pain to report. No more episodes of dizziness.    Patient is accompained by: Family member   daughter and spouse   Pertinent History GBS, a fib, hx of covid    Limitations Standing;Walking;Sitting    How long can you stand comfortably? about 1-2 minutes at the countertop with daughter assisting.    Patient Stated Goals wants to get up and walk.    Currently in Pain? No/denies                [facility] Adult PT Treatment/Exercise - 11/18/20 1454      Transfers   Transfers Lateral/Scoot Transfers    Sit to Stand 3: Mod assist;4: Min assist;With upper extremity assist;From bed;Other (comment)   lift pads on Stedy lift assist   Stand to Sit 4: Min assist;3: Mod assist;With upper extremity assist;To bed;Other  (comment)   lift pads of Stedy lift assist   Lateral/Scoot Transfers 5: Supervision    Lateral/Scoot Transfer Details (indicate cue type and reason) no slideboard used for wheelchair <>mat table.    Comments standing in the [facility] lift assist for 3 reps working on tall posture with bil knee/hip extension. pt with forward lean when knees are in extension, then when trunk is brought into tall posture bil knees buckle. pt with heavy UE reliance at this time. min guard to min assist for balance along with facilitation for posture. max standing time ~45 sec's.      Neuro Re-ed    Neuro Re-ed Details  for strengthening/balance: seated on green air disc- alternating UE raises for ~5 reps each side. Then with 2# weighted ball UE raises, then upper trunk rotation left<>right for 10 reps each. then with red band resistance for rows, then shoulder horizontal abduction for 10 reps each. cues with all ex's performed for posture and midline position.      Knee/Hip Exercises: Aerobic   Other Aerobic Pt seated in w/c with anti tipper blocks behind wheels performed at gear 1.5 for 6 minutes for ROM, strengthening, and activity tolerance with BLE and BUE - last minute with BLE only.  PT Short Term Goals - 11/05/20 0940      PT SHORT TERM GOAL #1   Title Pt and pt's caregiver will be independent with initial HEP in order to build upon functional gains made in therapy. ALL STGS DUE 12/03/20    Time 4    Period Weeks    Status New    Target Date 12/03/20      PT SHORT TERM GOAL #2   Title Pt will be able to perform a sliding board transfer with mod I in order to demo improved independence.    Baseline supervision with set up assist of sliding board.    Time 4    Period Weeks    Status New      PT SHORT TERM GOAL #3   Title Pt will tolerate standing in Stedy/ at the sink for a least 2 minutes with min A in order to demo improved standing tolerance.    Baseline 15 seconds at sink  with mod A    Time 4    Period Weeks    Status New      PT SHORT TERM GOAL #4   Title Pt will perform sit <> stand at countertop/Stedy with min A x1 in order to demo improved functional BLE strength.    Time 4    Period Weeks    Status New      PT SHORT TERM GOAL #5   Title Pt will perform bed mobility with CGA in order to decr caregiver burden.    Baseline min A for supine <> sit    Time 4    Period Weeks    Status New             PT Long Term Goals - 11/05/20 0944      PT LONG TERM GOAL #1   Title Pt and pt's caregiver will be independent with final HEP in order to build upon functional gains made in therapy. ALL LTGS DUE 12/31/20    Time 8    Period Weeks    Status New    Target Date 12/31/20      PT LONG TERM GOAL #2   Title Pt will be able to perform squat pivot transfer with CGA in order to demo improved transfers for decr caregiver burden.    Time 8    Period Weeks    Status New      PT LONG TERM GOAL #3   Title Pt will stand at [facility] for at least 3 minutes with min A in order to demo improved functional BLE strength and standing tolerance.    Time 8    Period Weeks    Status New      PT LONG TERM GOAL #4   Title Pt will perform sit <> stand with RW with min A in order to demo improved strength for transfers.    Time 8    Period Weeks    Status New      PT LONG TERM GOAL #5   Title Pt will ambulate at least 5' in // bars with min A in order to progress towards functional household mobility.    Baseline unable.    Time 8    Period Weeks    Status New                 Plan - 11/18/20 1454    Clinical Impression Statement Today's skilled session focused on strengthening and standing posture/tolerance  with rest breaks taken as needed due to fatigue. Pt continues to need heavy UE reliance and min/mod assist with standing. No issues noted with postural ex's, or reported. The pt is progressing and should benefit from continued PT to progress toward unmet  goals.    Personal Factors and Comorbidities Comorbidity 3+;Past/Current Experience   pt very active prior to diagnosis   Comorbidities GBS, a fib, hx of covid.    Examination-Activity Limitations Bathing;Bed Mobility;Caring for [employer];Locomotion Level;Dressing;Hygiene/Grooming;Transfers;Stand;Toileting    Examination-Participation Restrictions Cleaning;Community Activity;Yard Work;Meal Prep;Shop;Laundry;Driving;Occupation   exercise, cycling   Stability/Clinical Decision Making Evolving/Moderate complexity    Rehab Potential Good    PT Frequency 2x / week   1-2x a week   PT Duration --   7-8 weeks   PT Treatment/Interventions Manual techniques;Therapeutic exercise;Gait training;Neuromuscular re-education;Aquatic Therapy;Orthotic Fit/Training    PT Next Visit Plan strengthening for BLE - supine/seated. seated SciFit (with pt staying in w/c). dynamic sitting balance. standing in standing frame/in Stedy.    Consulted and Agree with Plan of Care Patient;Family member/caregiver    Family Member Consulted pt's wife and daughter.           Patient will benefit from skilled therapeutic intervention in order to improve the following deficits and impairments:  Decreased activity tolerance,Decreased coordination,Decreased balance,Decreased endurance,Decreased range of motion,Decreased strength,Difficulty walking,Impaired sensation,Postural dysfunction  Visit Diagnosis: Difficulty in walking, not elsewhere classified  Muscle weakness (generalized)  Other symptoms and signs involving the nervous system  Abnormal posture     Problem List Patient Active Problem List   Diagnosis Date Noted  . Acute on chronic respiratory failure with hypoxia (HCC)   . Guillain Barr syndrome (HCC)   . Paroxysmal atrial fibrillation (HCC)   . Autonomic dysfunction   . Severe sepsis (HCC)     Sallyanne Kuster, PTA, [facility] [address], [address] [place], [place]  [address] [phone] 11/19/20, 3:20 PM   Name: Joseph Mayo MRN: [id number] Date of Birth: 1945-10-21

## 2020-11-23 ENCOUNTER — Other Ambulatory Visit: Payer: Self-pay

## 2020-11-23 ENCOUNTER — Ambulatory Visit: Payer: No Typology Code available for payment source | Attending: Family Medicine | Admitting: Physical Therapy

## 2020-11-23 ENCOUNTER — Encounter: Payer: Self-pay | Admitting: Physical Therapy

## 2020-11-23 DIAGNOSIS — M6281 Muscle weakness (generalized): Secondary | ICD-10-CM | POA: Insufficient documentation

## 2020-11-23 DIAGNOSIS — R29818 Other symptoms and signs involving the nervous system: Secondary | ICD-10-CM | POA: Insufficient documentation

## 2020-11-23 DIAGNOSIS — R293 Abnormal posture: Secondary | ICD-10-CM | POA: Insufficient documentation

## 2020-11-23 DIAGNOSIS — R262 Difficulty in walking, not elsewhere classified: Secondary | ICD-10-CM | POA: Insufficient documentation

## 2020-11-23 NOTE — Therapy (Signed)
[facility] [address] [place], [place], [address] Phone: [phone]   Fax:  [phone]  Physical Therapy Treatment  Patient Details  Name: Joseph Mayo MRN: [id number] Date of Birth: [birth year] Referring Provider (PT): Jacky Kindle A   Encounter Date: 11/23/2020   PT End of Session - 11/23/20 1547    Visit Number 5    Number of Visits 16    Authorization Type [facility]    Authorization - Visit Number 4    Authorization - Number of Visits 15    PT Start Time 1404    PT Stop Time 1446    PT Time Calculation (min) 42 min    Equipment Utilized During Treatment Gait belt    Activity Tolerance Patient tolerated treatment well    Behavior During Therapy WFL for tasks assessed/performed           Past Medical History:  Diagnosis Date  . Acute on chronic respiratory failure with hypoxia (HCC)   . Autonomic dysfunction   . Guillain Barr syndrome (HCC)   . Paroxysmal atrial fibrillation (HCC)   . Severe sepsis (HCC)     History reviewed. No pertinent surgical history.  There were no vitals filed for this visit.   Subjective Assessment - 11/23/20 1407    Subjective No new complaints. Doing good. Exercises going well at home. Stood yesterday at the deck yesterday about 4 times.    Patient is accompained by: Family member   daughter and spouse   Pertinent History GBS, a fib, hx of covid    Limitations Standing;Walking;Sitting    How long can you stand comfortably? about 1-2 minutes at the countertop with daughter assisting.    Patient Stated Goals wants to get up and walk.    Currently in Pain? No/denies                             [facility] Adult PT Treatment/Exercise - 11/23/20 0001      Transfers   Sit to Stand 3: Mod assist;4: Min assist;With upper extremity assist;From bed;Other (comment)    Sit to Stand Details (indicate cue type and reason) from mat table and lift pads on Stedy - cues  for incr forward lean when coming to stand, performed x4 reps througout session    Stand to Sit 4: Min assist;3: Mod assist;With upper extremity assist;To bed;Other (comment)    Stand to Sit Details back to mat table and from lift pads of Stedy, needing assist for controlled descent    Lateral/Scoot Transfers 5: Supervision    Lateral/Scoot Transfer Details (indicate cue type and reason) without use of sliding board from w/c <> mat table    Comments standing in the Stedy: pt heavily reliant on BUE to stand and when in standing, when cued for B knee extension pt with incr forward trunk flexion and unable to stand tall, performed one rep standing for 45 seconds and 2 additional reps for 20 seconds - pt reporting incr discomfort in hamstrings when standing. performed standing in Standing frame with BUE supported on tray table: x2 minutes total, x10 reps glute squeezes, x10 reps B knee extension quad sets with tactile cues for technique      Knee/Hip Exercises: Stretches   Passive Hamstring Stretch Both;2 reps;30 seconds    Passive Hamstring Stretch Limitations with pt supine on mat table      Knee/Hip Exercises: Aerobic   Other Aerobic  Pt seated in w/c with anti tipper blocks behind wheels performed at gear 1.5 for 6 minutes for ROM, strengthening, and activity tolerance with BLE and BUE - last minute with BLE only.                    PT Short Term Goals - 11/05/20 0940      PT SHORT TERM GOAL #1   Title Pt and pt's caregiver will be independent with initial HEP in order to build upon functional gains made in therapy. ALL STGS DUE 12/03/20    Time 4    Period Weeks    Status New    Target Date 12/03/20      PT SHORT TERM GOAL #2   Title Pt will be able to perform a sliding board transfer with mod I in order to demo improved independence.    Baseline supervision with set up assist of sliding board.    Time 4    Period Weeks    Status New      PT SHORT TERM GOAL #3   Title Pt  will tolerate standing in Stedy/ at the sink for a least 2 minutes with min A in order to demo improved standing tolerance.    Baseline 15 seconds at sink with mod A    Time 4    Period Weeks    Status New      PT SHORT TERM GOAL #4   Title Pt will perform sit <> stand at countertop/Stedy with min A x1 in order to demo improved functional BLE strength.    Time 4    Period Weeks    Status New      PT SHORT TERM GOAL #5   Title Pt will perform bed mobility with CGA in order to decr caregiver burden.    Baseline min A for supine <> sit    Time 4    Period Weeks    Status New             PT Long Term Goals - 11/05/20 0944      PT LONG TERM GOAL #1   Title Pt and pt's caregiver will be independent with final HEP in order to build upon functional gains made in therapy. ALL LTGS DUE 12/31/20    Time 8    Period Weeks    Status New    Target Date 12/31/20      PT LONG TERM GOAL #2   Title Pt will be able to perform squat pivot transfer with CGA in order to demo improved transfers for decr caregiver burden.    Time 8    Period Weeks    Status New      PT LONG TERM GOAL #3   Title Pt will stand at [facility] for at least 3 minutes with min A in order to demo improved functional BLE strength and standing tolerance.    Time 8    Period Weeks    Status New      PT LONG TERM GOAL #4   Title Pt will perform sit <> stand with RW with min A in order to demo improved strength for transfers.    Time 8    Period Weeks    Status New      PT LONG TERM GOAL #5   Title Pt will ambulate at least 5' in // bars with min A in order to progress towards functional household mobility.  Baseline unable.    Time 8    Period Weeks    Status New                 Plan - 11/23/20 1552    Clinical Impression Statement today's skilled session focused on strengthening, hamstring ROM, and standing tolerance. When performed with the [facility], pt needing heavy UE reliance and has incr forward flexed  posture when trying to perform B knee extension in standing. Performed the standing frame for the first time today, with pt able to stand for 2 minutes. Will continue to progress towards LTGs.    Personal Factors and Comorbidities Comorbidity 3+;Past/Current Experience   pt very active prior to diagnosis   Comorbidities GBS, a fib, hx of covid.    Examination-Activity Limitations Bathing;Bed Mobility;Caring for [employer];Locomotion Level;Dressing;Hygiene/Grooming;Transfers;Stand;Toileting    Examination-Participation Restrictions Cleaning;Community Activity;Yard Work;Meal Prep;Shop;Laundry;Driving;Occupation   exercise, cycling   Stability/Clinical Decision Making Evolving/Moderate complexity    Rehab Potential Good    PT Frequency 2x / week   1-2x a week   PT Duration --   7-8 weeks   PT Treatment/Interventions Manual techniques;Therapeutic exercise;Gait training;Neuromuscular re-education;Aquatic Therapy;Orthotic Fit/Training    PT Next Visit Plan strengthening for BLE - supine/seated. seated SciFit (with pt staying in w/c). dynamic sitting balance. standing in standing frame/in Stedy.    Consulted and Agree with Plan of Care Patient;Family member/caregiver    Family Member Consulted pt's wife and daughter.           Patient will benefit from skilled therapeutic intervention in order to improve the following deficits and impairments:  Decreased activity tolerance,Decreased coordination,Decreased balance,Decreased endurance,Decreased range of motion,Decreased strength,Difficulty walking,Impaired sensation,Postural dysfunction  Visit Diagnosis: Difficulty in walking, not elsewhere classified  Muscle weakness (generalized)  Other symptoms and signs involving the nervous system  Abnormal posture     Problem List Patient Active Problem List   Diagnosis Date Noted  . Acute on chronic respiratory failure with hypoxia (HCC)   . Guillain Barr syndrome (HCC)   . Paroxysmal  atrial fibrillation (HCC)   . Autonomic dysfunction   . Severe sepsis (HCC)     Drake Leach, PT, DPT  11/23/2020, 3:54 PM  [facility] [address] [place], [place], [address] Phone: [phone]   Fax:  [phone]  Name: Joseph Mayo MRN: [id number] Date of Birth: 11/27/1945

## 2020-11-25 ENCOUNTER — Encounter: Payer: Self-pay | Admitting: Physical Therapy

## 2020-11-25 ENCOUNTER — Ambulatory Visit: Payer: No Typology Code available for payment source | Admitting: Physical Therapy

## 2020-11-25 ENCOUNTER — Other Ambulatory Visit: Payer: Self-pay

## 2020-11-25 DIAGNOSIS — M6281 Muscle weakness (generalized): Secondary | ICD-10-CM

## 2020-11-25 DIAGNOSIS — R262 Difficulty in walking, not elsewhere classified: Secondary | ICD-10-CM | POA: Diagnosis not present

## 2020-11-25 DIAGNOSIS — R29818 Other symptoms and signs involving the nervous system: Secondary | ICD-10-CM

## 2020-11-25 DIAGNOSIS — R293 Abnormal posture: Secondary | ICD-10-CM

## 2020-11-26 NOTE — Therapy (Signed)
[facility] [address] [place], [place], [address] Phone: [phone]   Fax:  [phone]  Physical Therapy Treatment  Patient Details  Name: Joseph Mayo MRN: [id number] Date of Birth: 1946-01-03 Referring Provider (PT): Jacky Kindle A   Encounter Date: 11/25/2020   PT End of Session - 11/25/20 1406    Visit Number 6    Number of Visits 16    Authorization Type VA    Authorization - Visit Number 5    Authorization - Number of Visits 15    PT Start Time 1402    PT Stop Time 1445    PT Time Calculation (min) 43 min    Equipment Utilized During Treatment Gait belt    Activity Tolerance Patient tolerated treatment well    Behavior During Therapy WFL for tasks assessed/performed           Past Medical History:  Diagnosis Date  . Acute on chronic respiratory failure with hypoxia (HCC)   . Autonomic dysfunction   . Guillain Barr syndrome (HCC)   . Paroxysmal atrial fibrillation (HCC)   . Severe sepsis (HCC)     History reviewed. No pertinent surgical history.  There were no vitals filed for this visit.   Subjective Assessment - 11/25/20 1405    Subjective No new complatins. No falls or pain to report.    Patient is accompained by: Family member   daughter and spouse   Pertinent History GBS, a fib, hx of covid    Limitations Standing;Walking;Sitting    How long can you stand comfortably? about 1-2 minutes at the countertop with daughter assisting.    Patient Stated Goals wants to get up and walk.    Currently in Pain? No/denies               [facility] Adult PT Treatment/Exercise - 11/25/20 1407      Transfers   Transfers Lateral/Scoot Transfers    Lateral/Scoot Transfers 5: Supervision    Lateral/Scoot Transfer Details (indicate cue type and reason) wheelchair<>mat table      Knee/Hip Exercises: Aerobic   Other Aerobic Pt seated in w/c with anti tipper blocks behind wheels performed at level  2.0 for 6 minutes for ROM, strengthening, and activity tolerance with BLE and BUE - last minute with BLE only.      Knee/Hip Exercises: Seated   Long Arc Quad AAROM;Strengthening;Both;1 set;10 reps;Limitations    Long [employer] Limitations assistance for slow and controlled movements in full range    Marching AAROM;Strengthening;Both;1 set;10 reps;Limitations    Marching Limitations assist needed for controlled lowering to floor with each rep.      Knee/Hip Exercises: Supine   Short Arc Quad Sets AAROM;Strengthening;Both;1 set;10 reps;Limitations    Short Arc Quad Sets Limitations with bolster under legs assist needed for full range and controlled movements. added this ex to HEP today.    Bridges AROM;Strengthening;Both;1 set;10 reps;Limitations    Bridges Limitations 1st set with arms across chest with a 5 second hold with each rep; 2cd set with yoga block between knees for pt to squeeze with each rep. cues for controlled lowering.    Bridges with [name] AROM;Strengthening;Both;1 set;10 reps;Limitations    Straight Leg Raises AROM;Strengthening;Both;1 set;10 reps;Limitations    Straight Leg Raises Limitations assist to decrease extension lag, to assist with lifitng and for slow/controlled movements               PT Short Term Goals - 11/05/20  0940      PT SHORT TERM GOAL #1   Title Pt and pt's caregiver will be independent with initial HEP in order to build upon functional gains made in therapy. ALL STGS DUE 12/03/20    Time 4    Period Weeks    Status New    Target Date 12/03/20      PT SHORT TERM GOAL #2   Title Pt will be able to perform a sliding board transfer with mod I in order to demo improved independence.    Baseline supervision with set up assist of sliding board.    Time 4    Period Weeks    Status New      PT SHORT TERM GOAL #3   Title Pt will tolerate standing in Stedy/ at the sink for a least 2 minutes with min A in order to demo improved standing  tolerance.    Baseline 15 seconds at sink with mod A    Time 4    Period Weeks    Status New      PT SHORT TERM GOAL #4   Title Pt will perform sit <> stand at countertop/Stedy with min A x1 in order to demo improved functional BLE strength.    Time 4    Period Weeks    Status New      PT SHORT TERM GOAL #5   Title Pt will perform bed mobility with CGA in order to decr caregiver burden.    Baseline min A for supine <> sit    Time 4    Period Weeks    Status New             PT Long Term Goals - 11/05/20 0944      PT LONG TERM GOAL #1   Title Pt and pt's caregiver will be independent with final HEP in order to build upon functional gains made in therapy. ALL LTGS DUE 12/31/20    Time 8    Period Weeks    Status New    Target Date 12/31/20      PT LONG TERM GOAL #2   Title Pt will be able to perform squat pivot transfer with CGA in order to demo improved transfers for decr caregiver burden.    Time 8    Period Weeks    Status New      PT LONG TERM GOAL #3   Title Pt will stand at [facility] for at least 3 minutes with min A in order to demo improved functional BLE strength and standing tolerance.    Time 8    Period Weeks    Status New      PT LONG TERM GOAL #4   Title Pt will perform sit <> stand with RW with min A in order to demo improved strength for transfers.    Time 8    Period Weeks    Status New      PT LONG TERM GOAL #5   Title Pt will ambulate at least 5' in // bars with min A in order to progress towards functional household mobility.    Baseline unable.    Time 8    Period Weeks    Status New                 Plan - 11/25/20 1406    Clinical Impression Statement Today's skilled session continued to focus on strengthening with pt reporting LE fatigue at end  of session. Added supine SAQ's to HEP this session. The pt is progressing toward goals and should benefit from continue PT to progress toward unmet goals.    Personal Factors and Comorbidities  Comorbidity 3+;Past/Current Experience   pt very active prior to diagnosis   Comorbidities GBS, a fib, hx of covid.    Examination-Activity Limitations Bathing;Bed Mobility;Caring for [employer];Locomotion Level;Dressing;Hygiene/Grooming;Transfers;Stand;Toileting    Examination-Participation Restrictions Cleaning;Community Activity;Yard Work;Meal Prep;Shop;Laundry;Driving;Occupation   exercise, cycling   Stability/Clinical Decision Making Evolving/Moderate complexity    Rehab Potential Good    PT Frequency 2x / week   1-2x a week   PT Duration --   7-8 weeks   PT Treatment/Interventions Manual techniques;Therapeutic exercise;Gait training;Neuromuscular re-education;Aquatic Therapy;Orthotic Fit/Training    PT Next Visit Plan strengthening for BLE - supine/seated. seated SciFit (with pt staying in w/c). dynamic sitting balance. standing in standing frame/in Stedy.    Consulted and Agree with Plan of Care Patient;Family member/caregiver    Family Member Consulted pt's wife and daughter.           Patient will benefit from skilled therapeutic intervention in order to improve the following deficits and impairments:  Decreased activity tolerance,Decreased coordination,Decreased balance,Decreased endurance,Decreased range of motion,Decreased strength,Difficulty walking,Impaired sensation,Postural dysfunction  Visit Diagnosis: Difficulty in walking, not elsewhere classified  Muscle weakness (generalized)  Other symptoms and signs involving the nervous system  Abnormal posture     Problem List Patient Active Problem List   Diagnosis Date Noted  . Acute on chronic respiratory failure with hypoxia (HCC)   . Guillain Barr syndrome (HCC)   . Paroxysmal atrial fibrillation (HCC)   . Autonomic dysfunction   . Severe sepsis (HCC)     Sallyanne Kuster, PTA, [facility] [address], [address] [place], [place] [address] [phone] 11/26/20, 3:54 PM   Name:  Joseph Mayo MRN: [id number] Date of Birth: [birth year]

## 2020-11-30 ENCOUNTER — Ambulatory Visit: Payer: No Typology Code available for payment source | Admitting: Physical Therapy

## 2020-11-30 ENCOUNTER — Other Ambulatory Visit: Payer: Self-pay

## 2020-11-30 ENCOUNTER — Encounter: Payer: Self-pay | Admitting: Physical Therapy

## 2020-11-30 DIAGNOSIS — R262 Difficulty in walking, not elsewhere classified: Secondary | ICD-10-CM

## 2020-11-30 DIAGNOSIS — M6281 Muscle weakness (generalized): Secondary | ICD-10-CM

## 2020-11-30 DIAGNOSIS — R293 Abnormal posture: Secondary | ICD-10-CM

## 2020-11-30 DIAGNOSIS — R29818 Other symptoms and signs involving the nervous system: Secondary | ICD-10-CM

## 2020-11-30 NOTE — Therapy (Signed)
[facility] [address] [place], [place], [address] Phone: [phone]   Fax:  [phone]  Physical Therapy Treatment  Patient Details  Name: Joseph Mayo MRN: [id number] Date of Birth: February 08, 1946 Referring Provider (PT): Jacky Kindle A   Encounter Date: 11/30/2020   PT End of Session - 11/30/20 1632    Visit Number 7    Number of Visits 16    Authorization Type [facility]    Authorization - Visit Number 6    Authorization - Number of Visits 15    PT Start Time 1402    PT Stop Time 1445    PT Time Calculation (min) 43 min    Equipment Utilized During Treatment Gait belt    Activity Tolerance Patient tolerated treatment well    Behavior During Therapy [facility] for tasks assessed/performed           Past Medical History:  Diagnosis Date  . Acute on chronic respiratory failure with hypoxia (HCC)   . Autonomic dysfunction   . Guillain Barr syndrome (HCC)   . Paroxysmal atrial fibrillation (HCC)   . Severe sepsis (HCC)     History reviewed. No pertinent surgical history.  There were no vitals filed for this visit.   Subjective Assessment - 11/30/20 1404    Subjective Feels like he is getting stronger.    Patient is accompained by: Family member   daughter and spouse   Pertinent History GBS, a fib, hx of covid    Limitations Standing;Walking;Sitting    How long can you stand comfortably? about 1-2 minutes at the countertop with daughter assisting.    Patient Stated Goals wants to get up and walk.    Currently in Pain? No/denies                             [facility] Adult PT Treatment/Exercise - 11/30/20 1429      Transfers   Lateral/Scoot Transfers 5: Supervision    Lateral/Scoot Transfer Details (indicate cue type and reason) wheelchair <> mat table    Comments standing in standing frame: x10 reps scapular retraction, x5 reps B alternating arm lifts, x10 reps quad sets, standing for a total  of 4 minutes      Knee/Hip Exercises: Stretches   Gastroc Stretch Both;2 reps;30 seconds    Gastroc Stretch Limitations pt supine on mat table      Knee/Hip Exercises: Supine   Straight Leg Raises AROM;Strengthening;Both;Limitations;2 sets;5 reps    Straight Leg Raises Limitations assist for lifting and for slow/controlled movements, cued for quad sets first    Other Supine Knee/Hip Exercises active ankle DF/PF x10 reps with pt in supine, on red physioball: digging heel into ball and knee flexion/hip flexion and knee extension, therapist helping for controlled movements at times x10 reps B    Other Supine Knee/Hip Exercises supine marching: x5 reps B, then with light therapist resistance 2 x 5 reps B                    PT Short Term Goals - 11/05/20 0940      PT SHORT TERM GOAL #1   Title Pt and pt's caregiver will be independent with initial HEP in order to build upon functional gains made in therapy. ALL STGS DUE 12/03/20    Time 4    Period Weeks    Status New    Target Date 12/03/20  PT SHORT TERM GOAL #2   Title Pt will be able to perform a sliding board transfer with mod I in order to demo improved independence.    Baseline supervision with set up assist of sliding board.    Time 4    Period Weeks    Status New      PT SHORT TERM GOAL #3   Title Pt will tolerate standing in Stedy/ at the sink for a least 2 minutes with min A in order to demo improved standing tolerance.    Baseline 15 seconds at sink with mod A    Time 4    Period Weeks    Status New      PT SHORT TERM GOAL #4   Title Pt will perform sit <> stand at countertop/Stedy with min A x1 in order to demo improved functional BLE strength.    Time 4    Period Weeks    Status New      PT SHORT TERM GOAL #5   Title Pt will perform bed mobility with CGA in order to decr caregiver burden.    Baseline min A for supine <> sit    Time 4    Period Weeks    Status New             PT Long Term  Goals - 11/05/20 0944      PT LONG TERM GOAL #1   Title Pt and pt's caregiver will be independent with final HEP in order to build upon functional gains made in therapy. ALL LTGS DUE 12/31/20    Time 8    Period Weeks    Status New    Target Date 12/31/20      PT LONG TERM GOAL #2   Title Pt will be able to perform squat pivot transfer with CGA in order to demo improved transfers for decr caregiver burden.    Time 8    Period Weeks    Status New      PT LONG TERM GOAL #3   Title Pt will stand at [facility] for at least 3 minutes with min A in order to demo improved functional BLE strength and standing tolerance.    Time 8    Period Weeks    Status New      PT LONG TERM GOAL #4   Title Pt will perform sit <> stand with RW with min A in order to demo improved strength for transfers.    Time 8    Period Weeks    Status New      PT LONG TERM GOAL #5   Title Pt will ambulate at least 5' in // bars with min A in order to progress towards functional household mobility.    Baseline unable.    Time 8    Period Weeks    Status New                 Plan - 11/30/20 1641    Clinical Impression Statement Today's skilled session focused on BLE strengthening and standing tolerance in standing frame. pt able to stand for a total of 4 minutes today in standing frame. Pt also able to perform active ankle DF in gravity eliminated position with minimal range. Will continue to progress towards LTGs.    Personal Factors and Comorbidities Comorbidity 3+;Past/Current Experience   pt very active prior to diagnosis   Comorbidities GBS, a fib, hx of covid.    Examination-Activity  Limitations Bathing;Bed Mobility;Caring for [employer];Locomotion Level;Dressing;Hygiene/Grooming;Transfers;Stand;Toileting    Examination-Participation Restrictions Cleaning;Community Activity;Yard Work;Meal Prep;Shop;Laundry;Driving;Occupation   exercise, cycling   Stability/Clinical Decision Making Evolving/Moderate  complexity    Rehab Potential Good    PT Frequency 2x / week   1-2x a week   PT Duration --   7-8 weeks   PT Treatment/Interventions Manual techniques;Therapeutic exercise;Gait training;Neuromuscular re-education;Aquatic Therapy;Orthotic Fit/Training    PT Next Visit Plan check STGs. strengthening for BLE - supine/seated. seated SciFit (with pt staying in w/c). dynamic sitting balance. standing in standing frame/in Stedy.    Consulted and Agree with Plan of Care Patient;Family member/caregiver    Family Member Consulted pt's wife and daughter.           Patient will benefit from skilled therapeutic intervention in order to improve the following deficits and impairments:  Decreased activity tolerance,Decreased coordination,Decreased balance,Decreased endurance,Decreased range of motion,Decreased strength,Difficulty walking,Impaired sensation,Postural dysfunction  Visit Diagnosis: Difficulty in walking, not elsewhere classified  Muscle weakness (generalized)  Other symptoms and signs involving the nervous system  Abnormal posture     Problem List Patient Active Problem List   Diagnosis Date Noted  . Acute on chronic respiratory failure with hypoxia (HCC)   . Guillain Barr syndrome (HCC)   . Paroxysmal atrial fibrillation (HCC)   . Autonomic dysfunction   . Severe sepsis (HCC)     Drake Leach, PT, DPT  11/30/2020, 4:42 PM  [facility] [address] [place], [place], [address] Phone: [phone]   Fax:  [phone]  Name: Joseph Mayo MRN: [id number] Date of Birth: 1945-09-13

## 2020-12-02 ENCOUNTER — Other Ambulatory Visit: Payer: Self-pay

## 2020-12-02 ENCOUNTER — Ambulatory Visit: Payer: No Typology Code available for payment source | Admitting: Physical Therapy

## 2020-12-02 ENCOUNTER — Encounter: Payer: Self-pay | Admitting: Physical Therapy

## 2020-12-02 DIAGNOSIS — R29818 Other symptoms and signs involving the nervous system: Secondary | ICD-10-CM

## 2020-12-02 DIAGNOSIS — R262 Difficulty in walking, not elsewhere classified: Secondary | ICD-10-CM | POA: Diagnosis not present

## 2020-12-02 DIAGNOSIS — M6281 Muscle weakness (generalized): Secondary | ICD-10-CM

## 2020-12-02 DIAGNOSIS — R293 Abnormal posture: Secondary | ICD-10-CM

## 2020-12-02 NOTE — Patient Instructions (Signed)
Access Code: [email or web address] URL: [email or web address] Date: 12/02/2020 Prepared by: Sallyanne Kuster  Exercises Supine Short Arc Quad - 1 x daily - 5 x weekly - 1 sets - 10 reps Supine Bridge - 1-2 x daily - 5 x weekly - 2 sets - 10 reps Seated Calf Stretch with Strap - 1 x daily - 5 x weekly - 3 sets - 20-30 hold Seated Ankle Plantarflexion with Resistance - 1 x daily - 5 x weekly - 1 sets - 10 reps Seated Hamstring Curl with Anchored Resistance - 1 x daily - 5 x weekly - 1 sets - 10 reps Seated Single Leg Hip Abduction with Resistance - 1 x daily - 5 x weekly - 1 sets - 10 reps

## 2020-12-03 NOTE — Therapy (Signed)
[facility] [address] [place], [place], [address] Phone: [phone]   Fax:  [phone]  Physical Therapy Treatment  Patient Details  Name: Joseph Mayo MRN: [id number] Date of Birth: [birth year] Referring Provider (PT): Greer Pickerel A   Encounter Date: 12/02/2020     12/02/20 1452  PT Visits / Re-Eval  Visit Number 8  Number of Visits [place] - Visit Number 7  Authorization - Number of Visits 15  PT Time Calculation  PT Start Time 1450  PT Stop Time 1530  PT Time Calculation (min) 40 min  PT - End of Session  Equipment Utilized During Treatment Gait belt  Activity Tolerance Patient tolerated treatment well  Behavior During Therapy [facility] for tasks assessed/performed    Past Medical History:  Diagnosis Date  . Acute on chronic respiratory failure with hypoxia ([place])   . Autonomic dysfunction   . Guillain Barr syndrome ([place])   . Paroxysmal atrial fibrillation (HCC)   . Severe sepsis ([place])     History reviewed. No pertinent surgical history.  There were no vitals filed for this visit.     12/02/20 1452  Symptoms/Limitations  Subjective No new complaints.  No falls or pain to report.  Patient is accompained by: Family member (daughter and spouse)  Pertinent History GBS, a fib, hx of covid  Limitations Standing;Walking;Sitting  How long can you stand comfortably? about 1-2 minutes at the countertop with daughter assisting.  Patient Stated Goals wants to get up and walk.  Pain Assessment  Currently in Pain? No/denies      12/02/20 1452  Symptoms/Limitations  Subjective No new complaints.  No falls or pain to report.  Patient is accompained by: Family member (daughter and spouse)  Pertinent History GBS, a fib, hx of covid  Limitations Standing;Walking;Sitting  How long can you stand comfortably? about 1-2 minutes at the countertop with  daughter assisting.  Patient Stated Goals wants to get up and walk.  Pain Assessment  Currently in Pain? No/denies        12/02/20 1455  Transfers  Transfers Lateral/Scoot Transfers;Sit to Stand;Stand to Sit  Sit to Stand 4: Min assist;With upper extremity assist;From bed  Sit to Stand Details (indicate cue type and reason) from mat table to stedy lift assist with cues for weight shifting.  Stand to Sit 4: Min assist;With upper extremity assist;To bed  Stand to Sit Details from stedy lift to wheelchair with cues on weight shifitng with UE assist on stedy lift.  Comments standing in stedy x 2 reps- 2 minutes on  1st rep with min guard assist with pt able to lock out knees (did not need knee pads on stedy) and work on improved upright posture. continues with heavy UE reliance. on 2cd reps- pt worked on  stepping in place x 5 alternating steps on each leg. min guard to min assist for balance with this. No knee buckling noted with stepping. cues continued to be needed for posture and weight shifting.  Exercises  Exercises Other Exercises  Other Exercises  updated HEP. refer to [facility] for full details. cues for form/technique with new ex's.    Issued the following to HEP today: Access Code: [email or web address] URL: [email or web address] Date: 12/02/2020 Prepared by: Willow Ora  Exercises Supine Short Arc Quad - 1 x daily - 5 x weekly - 1 sets - 10 reps Supine Bridge - 1-2 x daily - 5 x weekly - 2  sets - 10 reps Seated Calf Stretch with Strap - 1 x daily - 5 x weekly - 3 sets - 20-30 hold Seated Ankle Plantarflexion with Resistance - 1 x daily - 5 x weekly - 1 sets - 10 reps Seated Hamstring Curl with Anchored Resistance - 1 x daily - 5 x weekly - 1 sets - 10 reps Seated Single Leg Hip Abduction with Resistance - 1 x daily - 5 x weekly - 1 sets - 10 reps      12/02/20 1659  PT Education  Education Details progress toward goals and updated HEP  Person(s) Educated  Patient;Spouse;Child(ren)  Methods Explanation;Demonstration;Verbal cues;Handout  Comprehension Verbalized understanding;Returned demonstration;Verbal cues required;Need further instruction        PT Short Term Goals - 12/02/20 1454      PT SHORT TERM GOAL #1   Title Pt and pt's caregiver will be independent with initial HEP in order to build upon functional gains made in therapy. ALL STGS DUE 12/03/20    Time 4    Period Weeks    Status New    Target Date 12/03/20      PT SHORT TERM GOAL #2   Title Pt will be able to perform a sliding board transfer with mod I in order to demo improved independence.    Baseline supervision with set up assist of sliding board.    Time 4    Period Weeks    Status New      PT SHORT TERM GOAL #3   Title Pt will tolerate standing in [place]/ at the sink for a least 2 minutes with min A in order to demo improved standing tolerance.    Baseline 15 seconds at sink with mod A    Time 4    Period Weeks    Status New      PT SHORT TERM GOAL #4   Title Pt will perform sit <> stand at countertop/Stedy with min A x1 in order to demo improved functional BLE strength.    Time 4    Period Weeks    Status New      PT SHORT TERM GOAL #5   Title Pt will perform bed mobility with CGA in order to decr caregiver burden.    Baseline min A for supine <> sit    Time 4    Period Weeks    Status New             PT Long Term Goals - 11/05/20 0944      PT LONG TERM GOAL #1   Title Pt and pt's caregiver will be independent with final HEP in order to build upon functional gains made in therapy. ALL LTGS DUE 12/31/20    Time 8    Period Weeks    Status New    Target Date 12/31/20      PT LONG TERM GOAL #2   Title Pt will be able to perform squat pivot transfer with CGA in order to demo improved transfers for decr caregiver burden.    Time 8    Period Weeks    Status New      PT LONG TERM GOAL #3   Title Pt will stand at [facility] for at least 3 minutes with min  A in order to demo improved functional BLE strength and standing tolerance.    Time 8    Period Weeks    Status New      PT LONG TERM  GOAL #4   Title Pt will perform sit <> stand with RW with min A in order to demo improved strength for transfers.    Time 8    Period Weeks    Status New      PT LONG TERM GOAL #5   Title Pt will ambulate at least 5' in // bars with min A in order to progress towards functional household mobility.    Baseline unable.    Time 8    Period Weeks    Status New             12/02/20 1453  Plan  Clinical Impression Statement Today's skilled session focused on progress toward STGs with all goals met. Pt showed significant improvement with standing in the stedy today, even progressed to pre-gait stepping in place. The pt is progressing toward goals and should benefit from continued PT to progress toward unmet goals.  Personal Factors and Comorbidities Comorbidity 3+;Past/Current Experience (pt very active prior to diagnosis)  Comorbidities GBS, a fib, hx of covid.  Examination-Activity Limitations Bathing;Bed Mobility;Caring for [employer];Locomotion Level;Dressing;Hygiene/Grooming;Transfers;Stand;Toileting  Examination-Participation Restrictions Cleaning;Community Activity;Yard Work;Meal Prep;Shop;Laundry;Driving;Occupation (exercise, cycling)  Pt will benefit from skilled therapeutic intervention in order to improve on the following deficits Decreased activity tolerance;Decreased coordination;Decreased balance;Decreased endurance;Decreased range of motion;Decreased strength;Difficulty walking;Impaired sensation;Postural dysfunction  Stability/Clinical Decision Making Evolving/Moderate complexity  Rehab Potential Good  PT Frequency 2x / week (1-2x a week)  PT Duration  (7-8 weeks)  PT Treatment/Interventions Manual techniques;Therapeutic exercise;Gait training;Neuromuscular re-education;Aquatic Therapy;Orthotic Fit/Training  PT Next Visit Plan  standing/pre-gait in parallel bars with second person assistance. continue with LE strengthening. Scifit- from wheelchair or lateral transfer into scifit seat. continue to work on standing.  Consulted and Agree with Plan of Care Patient;Family member/caregiver  Family Member Consulted pt's wife and daughter.          Patient will benefit from skilled therapeutic intervention in order to improve the following deficits and impairments:  Decreased activity tolerance,Decreased coordination,Decreased balance,Decreased endurance,Decreased range of motion,Decreased strength,Difficulty walking,Impaired sensation,Postural dysfunction  Visit Diagnosis: Difficulty in walking, not elsewhere classified  Muscle weakness (generalized)  Other symptoms and signs involving the nervous system  Abnormal posture     Problem List Patient Active Problem List   Diagnosis Date Noted  . Acute on chronic respiratory failure with hypoxia ([place])   . Guillain Barr syndrome ([place])   . Paroxysmal atrial fibrillation (HCC)   . Autonomic dysfunction   . Severe sepsis ([place])     Willow Ora, PTA, [facility] [address] [place], [place] [address] [phone] 12/03/20, 11:14 PM   Name: Joseph Mayo MRN: [id number] Date of Birth: [birth year]

## 2020-12-07 ENCOUNTER — Ambulatory Visit: Payer: No Typology Code available for payment source | Admitting: Physical Therapy

## 2020-12-09 ENCOUNTER — Ambulatory Visit: Payer: No Typology Code available for payment source | Admitting: Physical Therapy

## 2020-12-09 ENCOUNTER — Other Ambulatory Visit: Payer: Self-pay

## 2020-12-09 ENCOUNTER — Encounter: Payer: Self-pay | Admitting: Physical Therapy

## 2020-12-09 DIAGNOSIS — R262 Difficulty in walking, not elsewhere classified: Secondary | ICD-10-CM

## 2020-12-09 DIAGNOSIS — M6281 Muscle weakness (generalized): Secondary | ICD-10-CM

## 2020-12-09 DIAGNOSIS — R293 Abnormal posture: Secondary | ICD-10-CM

## 2020-12-09 DIAGNOSIS — R29818 Other symptoms and signs involving the nervous system: Secondary | ICD-10-CM

## 2020-12-10 NOTE — Therapy (Signed)
[facility] [address] [place], [place], [address] Phone: [phone]   Fax:  [phone]  Physical Therapy Treatment  Patient Details  Name: ISAIC SYLER MRN: [id number] Date of Birth: 08-Sep-1945 Referring Provider (PT): Jacky Kindle A   Encounter Date: 12/09/2020   PT End of Session - 12/10/20 1607    Visit Number 9    Number of Visits 16    Authorization Type [facility]    Authorization - Visit Number 8    Authorization - Number of Visits 15    PT Start Time 1400    PT Stop Time 1444    PT Time Calculation (min) 44 min    Equipment Utilized During Treatment Gait belt    Activity Tolerance Patient tolerated treatment well    Behavior During Therapy [facility] for tasks assessed/performed           Past Medical History:  Diagnosis Date  . Acute on chronic respiratory failure with hypoxia (HCC)   . Autonomic dysfunction   . Guillain Barr syndrome (HCC)   . Paroxysmal atrial fibrillation (HCC)   . Severe sepsis (HCC)     History reviewed. No pertinent surgical history.  There were no vitals filed for this visit.   Subjective Assessment - 12/09/20 1402    Subjective Got new hearing aids. Saw the neurologist and asked for a referral for OT.    Patient is accompained by: Family member   daughter and spouse   Pertinent History GBS, a fib, hx of covid    Limitations Standing;Walking;Sitting    How long can you stand comfortably? about 1-2 minutes at the countertop with daughter assisting.    Patient Stated Goals wants to get up and walk.    Currently in Pain? No/denies                             [facility] Adult PT Treatment/Exercise - 12/09/20 1404      Transfers   Sit to Stand 4: Min assist;With upper extremity assist;From bed    Sit to Stand Details (indicate cue type and reason) from mat table to [place], and from w/c in // bars x3 reps performed throughout session, cues for nose over toes  and incr forward weight shift    Stand to Sit 4: Min assist;With upper extremity assist;To bed    Stand to Sit Details from [facility] lift back down to mat table and from // bars back to w/c    Comments standing in [place]: x2 reps ~45 seconds-1 minute each, first rep working on tall posture and B knee/hip extension (with pt still heavily reliant on BUE). 2nd rep performed x5 reps alternating steps each leg - no knee buckling noted, cues for proper weight shift and B quad activation. performed standing in // bars for approx. 15 seconds prior to gait with BUE support, no knee buckling noted in standing      Ambulation/Gait   Ambulation/Gait Yes    Ambulation/Gait Assistance 4: Min assist;3: Mod assist    Ambulation/Gait Assistance Details PT in front of pt for balance and knee stability (no knee buckling noted with gait), 2nd PT student on outside of bars to assist with balance/upright posture with close w/c follow. cues for proper weight shift and quad activation during stance and posture, pt able to advance legs without assist from therapist    Ambulation Distance (Feet) 6 Feet   x2  Assistive device Parallel bars    Gait Pattern Step-through pattern;Step-to pattern;Decreased step length - right;Decreased step length - left;Decreased dorsiflexion - right;Decreased dorsiflexion - left;Decreased weight shift to right;Decreased weight shift to left      Knee/Hip Exercises: Aerobic   Other Aerobic Pt seated in w/c with anti tipper blocks behind wheels performed at level 2.0 for 6 minutes for ROM, strengthening, and activity tolerance with BLE and BUE - (bouts of 1 minute each with just BLE only) (P)                  PT Education - 12/10/20 1606    Education Details discussed aquatic therapy with pt and pt's daughter/spouse (pt was asking how to get started for it) - PT explained process and more about aquatic PT and will put in referral to aquatic PT    Person(s) Educated Patient;Spouse;Child(ren)     Methods Explanation    Comprehension Verbalized understanding            PT Short Term Goals - 12/02/20 1454      PT SHORT TERM GOAL #1   Title Pt and pt's caregiver will be independent with initial HEP in order to build upon functional gains made in therapy. ALL STGS DUE 12/03/20    Baseline 12/02/20: met with current HEP, updated HEP this session    Time --    Period --    Status Achieved    Target Date --      PT SHORT TERM GOAL #2   Title Pt will be able to perform a sliding board transfer with mod I in order to demo improved independence.    Baseline 12/02/20: pt performs lateral transfers supervision to Mod I with no board needed    Time --    Period --    Status Achieved      PT SHORT TERM GOAL #3   Title Pt will tolerate standing in Stedy/ at the sink for a least 2 minutes with min A in order to demo improved standing tolerance.    Baseline 12/02/20: met in session today    Time --    Period --    Status Achieved      PT SHORT TERM GOAL #4   Title Pt will perform sit <> stand at countertop/Stedy with min A x1 in order to demo improved functional BLE strength.    Baseline 12/02/20: met in session today    Time --    Period --    Status Achieved      PT SHORT TERM GOAL #5   Title Pt will perform bed mobility with CGA in order to decr caregiver burden.    Baseline 12/02/20: met in session today with HEP review    Time --    Period --    Status Achieved             PT Long Term Goals - 11/05/20 0944      PT LONG TERM GOAL #1   Title Pt and pt's caregiver will be independent with final HEP in order to build upon functional gains made in therapy. ALL LTGS DUE 12/31/20    Time 8    Period Weeks    Status New    Target Date 12/31/20      PT LONG TERM GOAL #2   Title Pt will be able to perform squat pivot transfer with CGA in order to demo improved transfers for decr caregiver burden.  Time 8    Period Weeks    Status New      PT LONG TERM GOAL #3    Title Pt will stand at [facility] for at least 3 minutes with min A in order to demo improved functional BLE strength and standing tolerance.    Time 8    Period Weeks    Status New      PT LONG TERM GOAL #4   Title Pt will perform sit <> stand with RW with min A in order to demo improved strength for transfers.    Time 8    Period Weeks    Status New      PT LONG TERM GOAL #5   Title Pt will ambulate at least 5' in // bars with min A in order to progress towards functional household mobility.    Baseline unable.    Time 8    Period Weeks    Status New                 Plan - 12/10/20 1610    Clinical Impression Statement Trialed gait in the // bars today with pt able to ambulate approx 6-8' with min-mod A +2 therapists and close w/c follow. Pt able to progress his BLE on his own when stepping. Pt is making excellent progress, will continue to progress towards LTGs.    Personal Factors and Comorbidities Comorbidity 3+;Past/Current Experience   pt very active prior to diagnosis   Comorbidities GBS, a fib, hx of covid.    Examination-Activity Limitations Bathing;Bed Mobility;Caring for [employer];Locomotion Level;Dressing;Hygiene/Grooming;Transfers;Stand;Toileting    Examination-Participation Restrictions Cleaning;Community Activity;Yard Work;Meal Prep;Shop;Laundry;Driving;Occupation   exercise, cycling   Stability/Clinical Decision Making Evolving/Moderate complexity    Rehab Potential Good    PT Frequency 2x / week   1-2x a week   PT Duration --   7-8 weeks   PT Treatment/Interventions Manual techniques;Therapeutic exercise;Gait training;Neuromuscular re-education;Aquatic Therapy;Orthotic Fit/Training    PT Next Visit Plan any word from aquatics? standing/pre-gait and gait in parallel bars with second person assistance. continue with LE strengthening. Scifit- from wheelchair or lateral transfer into scifit seat. continue to work on standing.    Consulted and Agree with Plan  of Care Patient;Family member/caregiver    Family Member Consulted pt's wife and daughter.           Patient will benefit from skilled therapeutic intervention in order to improve the following deficits and impairments:  Decreased activity tolerance,Decreased coordination,Decreased balance,Decreased endurance,Decreased range of motion,Decreased strength,Difficulty walking,Impaired sensation,Postural dysfunction  Visit Diagnosis: Difficulty in walking, not elsewhere classified  Muscle weakness (generalized)  Other symptoms and signs involving the nervous system  Abnormal posture     Problem List Patient Active Problem List   Diagnosis Date Noted  . Acute on chronic respiratory failure with hypoxia ([place])   . Guillain Barr syndrome ([place])   . Paroxysmal atrial fibrillation (HCC)   . Autonomic dysfunction   . Severe sepsis [facility])     Lillia Pauls, DPT 12/10/2020, 4:17 PM  [facility] [address] [place], [place], [address] Phone: [phone]   Fax:  [phone]  Name: HOOVER GREWE MRN: [id number] Date of Birth: November 28, 1945

## 2020-12-14 ENCOUNTER — Ambulatory Visit: Payer: No Typology Code available for payment source | Admitting: Physical Therapy

## 2020-12-14 ENCOUNTER — Encounter: Payer: Self-pay | Admitting: Physical Therapy

## 2020-12-14 ENCOUNTER — Other Ambulatory Visit: Payer: Self-pay

## 2020-12-14 DIAGNOSIS — R29818 Other symptoms and signs involving the nervous system: Secondary | ICD-10-CM

## 2020-12-14 DIAGNOSIS — R262 Difficulty in walking, not elsewhere classified: Secondary | ICD-10-CM | POA: Diagnosis not present

## 2020-12-14 DIAGNOSIS — M6281 Muscle weakness (generalized): Secondary | ICD-10-CM

## 2020-12-14 NOTE — Therapy (Signed)
[facility] [address] [place], [place], [address] Phone: [phone]   Fax:  [phone]  Physical Therapy Treatment/10th Visit Progress Note  Patient Details  Name: Joseph Mayo MRN: [id number] Date of Birth: March 13, 1946 Referring Provider (PT): Greer Pickerel A  10th Visit Physical Therapy Progress Note  Dates of Reporting Period: 11/04/20  to 12/14/20    Encounter Date: 12/14/2020   PT End of Session - 12/14/20 1623    Visit Number 10    Number of Visits 16    Authorization Type [facility]    Authorization - Visit Number 9    Authorization - Number of Visits 15    PT Start Time 6962    PT Stop Time 1445    PT Time Calculation (min) 42 min    Equipment Utilized During Treatment Gait belt    Activity Tolerance Patient tolerated treatment well    Behavior During Therapy WFL for tasks assessed/performed           Past Medical History:  Diagnosis Date  . Acute on chronic respiratory failure with hypoxia ([place])   . Autonomic dysfunction   . Guillain Barr syndrome ([place])   . Paroxysmal atrial fibrillation (HCC)   . Severe sepsis ([place])     History reviewed. No pertinent surgical history.  There were no vitals filed for this visit.   Subjective Assessment - 12/14/20 1406    Subjective Was tired after last time. Legs weren't sore after walking. They just get sore when he is home.    Patient is accompained by: Family member   daughter and spouse   Pertinent History GBS, a fib, hx of covid    Limitations Standing;Walking;Sitting    How long can you stand comfortably? about 1-2 minutes at the countertop with daughter assisting.    Patient Stated Goals wants to get up and walk.    Currently in Pain? No/denies                             [facility] Adult PT Treatment/Exercise - 12/14/20 1411      Transfers   Sit to Stand 4: Min assist;With upper extremity assist;From bed    Sit to Stand Details  (indicate cue type and reason) from w/c in // bars with BUE support, cues for nose forward over toes, performed x3 reps throughout session    Stand to Sit 4: Min assist;With upper extremity assist;To bed;3: Mod assist    Stand to Sit Details from // bars back to w/c, x3 reps performed in session, at end of 1st bout of gait, pt lost his balance/LLE gave way and needing mod A to slowly descend back into chair      Ambulation/Gait   Ambulation/Gait Yes    Ambulation/Gait Assistance 3: Mod assist    Ambulation/Gait Assistance Details PT in front of pt for balance/knee stability during stance and PT tech following closely with w/c - therapist providing verbal/tactile cues for weight shifting and knee extension during stance phase, pt able to advance RLE on his own today but needing assist to advance LLE    Ambulation Distance (Feet) 5 Feet   x2   Assistive device Parallel bars    Gait Pattern Step-to pattern;Decreased step length - right;Decreased step length - left;Decreased dorsiflexion - right;Decreased dorsiflexion - left;Decreased weight shift to right;Decreased weight shift to left    Pre-Gait Activities standing in // bars: working on [employer]  standing with cues for erect posture and B knee extension, x5 reps B taking step forward and step back to midline with verbal/tactile cues for weight shift and quad activation. pt with no episodes of knee buckling during pre gait.      Knee/Hip Exercises: Seated   Ball Squeeze x10 reps with use of yoga block      Knee/Hip Exercises: Supine   Bridges AROM;Strengthening;Both;1 set;10 reps;Limitations    Bridges Limitations cues for full ROM    Straight Leg Raises AROM;Strengthening;Both;Limitations;2 sets;5 reps    Straight Leg Raises Limitations assist for lifting and for slow/controlled movements, cued for quad sets first    Other Supine Knee/Hip Exercises supine marching: x10 reps B with light therapist resistance                    PT Short  Term Goals - 12/02/20 1454      PT SHORT TERM GOAL #1   Title Pt and pt's caregiver will be independent with initial HEP in order to build upon functional gains made in therapy. ALL STGS DUE 12/03/20    Baseline 12/02/20: met with current HEP, updated HEP this session    Time --    Period --    Status Achieved    Target Date --      PT SHORT TERM GOAL #2   Title Pt will be able to perform a sliding board transfer with mod I in order to demo improved independence.    Baseline 12/02/20: pt performs lateral transfers supervision to Mod I with no board needed    Time --    Period --    Status Achieved      PT SHORT TERM GOAL #3   Title Pt will tolerate standing in [place]/ at the sink for a least 2 minutes with min A in order to demo improved standing tolerance.    Baseline 12/02/20: met in session today    Time --    Period --    Status Achieved      PT SHORT TERM GOAL #4   Title Pt will perform sit <> stand at countertop/[place] with min A x1 in order to demo improved functional BLE strength.    Baseline 12/02/20: met in session today    Time --    Period --    Status Achieved      PT SHORT TERM GOAL #5   Title Pt will perform bed mobility with CGA in order to decr caregiver burden.    Baseline 12/02/20: met in session today with HEP review    Time --    Period --    Status Achieved             PT Long Term Goals - 11/05/20 0944      PT LONG TERM GOAL #1   Title Pt and pt's caregiver will be independent with final HEP in order to build upon functional gains made in therapy. ALL LTGS DUE 12/31/20    Time 8    Period Weeks    Status New    Target Date 12/31/20      PT LONG TERM GOAL #2   Title Pt will be able to perform squat pivot transfer with CGA in order to demo improved transfers for decr caregiver burden.    Time 8    Period Weeks    Status New      PT LONG TERM GOAL #3   Title Pt will  stand at [facility] for at least 3 minutes with min A in order to demo improved functional  BLE strength and standing tolerance.    Time 8    Period Weeks    Status New      PT LONG TERM GOAL #4   Title Pt will perform sit <> stand with RW with min A in order to demo improved strength for transfers.    Time 8    Period Weeks    Status New      PT LONG TERM GOAL #5   Title Pt will ambulate at least 5' in // bars with min A in order to progress towards functional household mobility.    Baseline unable.    Time 8    Period Weeks    Status New            10    Plan - 12/14/20 1632    Clinical Impression Statement 10th visit progess note: STGs assessed 12/02/20 with pt meeting all 5 STGs. Pt able to tolerate standing in [place] with min A for 2 minutes. Performed gait for the first time at last session with pt able to ambulate approx. 8-10' in the // bars with min/mod A of 2 people and w/c follow. Performed gait in // bars again today with pt needing mod A x1 and close w/c follow, performed in 2 bouts of 5' today due to incr fatigue. Pt needing incr assistance today for foot clearance with LLE during swing. Pt is making excellent progress,will continue to progress towards LTGs.    Personal Factors and Comorbidities Comorbidity 3+;Past/Current Experience   pt very active prior to diagnosis   Comorbidities GBS, a fib, hx of covid.    Examination-Activity Limitations Bathing;Bed Mobility;Caring for [employer];Locomotion Level;Dressing;Hygiene/Grooming;Transfers;Stand;Toileting    Examination-Participation Restrictions Cleaning;Community Activity;Yard Work;Meal Prep;Shop;Laundry;Driving;Occupation   exercise, cycling   Stability/Clinical Decision Making Evolving/Moderate complexity    Rehab Potential Good    PT Frequency 2x / week   1-2x a week   PT Duration --   7-8 weeks   PT Treatment/Interventions Manual techniques;Therapeutic exercise;Gait training;Neuromuscular re-education;Aquatic Therapy;Orthotic Fit/Training    PT Next Visit Plan any word from aquatics from  [place]? standing/pre-gait and gait in parallel bars with second person assistance. continue with LE strengthening. Scifit- from wheelchair or lateral transfer into scifit seat. continue to work on standing.    Consulted and Agree with Plan of Care Patient;Family member/caregiver    Family Member Consulted pt's wife and daughter.           Patient will benefit from skilled therapeutic intervention in order to improve the following deficits and impairments:  Decreased activity tolerance,Decreased coordination,Decreased balance,Decreased endurance,Decreased range of motion,Decreased strength,Difficulty walking,Impaired sensation,Postural dysfunction  Visit Diagnosis: Difficulty in walking, not elsewhere classified  Muscle weakness (generalized)  Other symptoms and signs involving the nervous system     Problem List Patient Active Problem List   Diagnosis Date Noted  . Acute on chronic respiratory failure with hypoxia ([place])   . Guillain Barr syndrome ([place])   . Paroxysmal atrial fibrillation (HCC)   . Autonomic dysfunction   . Severe sepsis ([place])     Arliss Journey, PT, DPT  12/14/2020, 4:33 PM  [facility] [address] [place], [place], [address] Phone: [phone]   Fax:  [phone]  Name: Joseph Mayo MRN: [id number] Date of Birth: 1946/03/07

## 2020-12-16 ENCOUNTER — Encounter: Payer: Self-pay | Admitting: Physical Therapy

## 2020-12-16 ENCOUNTER — Other Ambulatory Visit: Payer: Self-pay

## 2020-12-16 ENCOUNTER — Ambulatory Visit: Payer: No Typology Code available for payment source | Admitting: Physical Therapy

## 2020-12-16 DIAGNOSIS — R293 Abnormal posture: Secondary | ICD-10-CM

## 2020-12-16 DIAGNOSIS — R29818 Other symptoms and signs involving the nervous system: Secondary | ICD-10-CM

## 2020-12-16 DIAGNOSIS — M6281 Muscle weakness (generalized): Secondary | ICD-10-CM

## 2020-12-16 DIAGNOSIS — R262 Difficulty in walking, not elsewhere classified: Secondary | ICD-10-CM | POA: Diagnosis not present

## 2020-12-17 NOTE — Therapy (Signed)
[facility] [address] [place], [place], [address] Phone: [phone]   Fax:  [phone]  Physical Therapy Treatment  Patient Details  Name: Joseph Mayo MRN: [id number] Date of Birth: 07-Nov-1945 Referring Provider (PT): Greer Pickerel A   Encounter Date: 12/16/2020   PT End of Session - 12/16/20 1554    Visit Number 11    Number of Visits 16    Authorization Type VA    Authorization - Visit Number 10    Authorization - Number of Visits 15    PT Start Time 1700    PT Stop Time 1445    PT Time Calculation (min) 43 min    Equipment Utilized During Treatment Gait belt    Activity Tolerance Patient tolerated treatment well    Behavior During Therapy WFL for tasks assessed/performed           Past Medical History:  Diagnosis Date  . Acute on chronic respiratory failure with hypoxia ([place])   . Autonomic dysfunction   . Guillain Barr syndrome ([place])   . Paroxysmal atrial fibrillation (HCC)   . Severe sepsis ([place])     History reviewed. No pertinent surgical history.  There were no vitals filed for this visit.   Subjective Assessment - 12/16/20 1553    Subjective No new complaints. No falls or pain to report.    Patient is accompained by: Family member   daughter and spouse   Pertinent History GBS, a fib, hx of covid    Limitations Standing;Walking;Sitting    How long can you stand comfortably? about 1-2 minutes at the countertop with daughter assisting.    Patient Stated Goals wants to get up and walk.    Currently in Pain? No/denies                   [facility] Adult PT Treatment/Exercise - 12/16/20 1415      Transfers   Transfers Sit to Stand;Stand to Sit    Sit to Stand 4: Min assist;With upper extremity assist;From chair/3-in-1    Stand to Sit 4: Min assist;With upper extremity assist;To chair/3-in-1      Ambulation/Gait   Ambulation/Gait Yes    Ambulation/Gait Assistance 4: Min  assist;3: Mod assist    Ambulation/Gait Assistance Details in parallel bars- PTA seated in front of pt to block knees, assist with step placement as needed and for balance with min assist needed, close wheelchair follow from rehab tech; then with RW- 2 person assist on 1st rep with PTA min to mod assist, second therapist min guard assist, 2cd lap PTA min/mod assist with second therapist stand by assist, wheelchair follow both times. assist needed to advance RW with both gait reps. pt with bil foot drop and right knee buckling as he fatigued with gait.    Ambulation Distance (Feet) 8 Feet   x2 parallel bars; 25 x1, 30 x1 with RW   Assistive device Rolling walker;Parallel bars    Gait Pattern Step-to pattern;Step-through pattern;Decreased stride length;Decreased step length - right;Decreased step length - left;Decreased dorsiflexion - right;Decreased dorsiflexion - left;Right steppage;Left steppage;Narrow base of support   occasional right>left knee buckling needing min assist at times to correct, other times pt self corrected   Ambulation Surface Level;Indoor      Therapeutic Activites    Therapeutic Activities Other Therapeutic Activities    Other Therapeutic Activities in standing in parallel bars: with UE support lateral weight shifting with emphasis on tall posture, then alternating  stepping in place with PTA guarding knees to prevent buckling; Then working on decreased UE support- alternating UE raises for ~ 5 reps each side, then had pt clapping for ~5 reps, ending with bil UE raises for 5 reps. Bil knees blocked with these activities with min to mod assist for balance, cues on posture/abdominal bracing; with UE support mini squats for 10 reps with min guard assist.       Knee/Hip Exercises: Supine   Bridges AROM;Strengthening;Both;1 set;10 reps;Limitations    Bridges Limitations with red pball under legs and cues for full pelvi lifting with each rep.    Other Supine Knee/Hip Exercises HS curls  with red pball for 10 reps with assist to keep feet stable on ball surface.    Other Supine Knee/Hip Exercises with red band resitance- hip falls outs for 10 reps each sdie                PT Short Term Goals - 12/02/20 1454      PT SHORT TERM GOAL #1   Title Pt and pt's caregiver will be independent with initial HEP in order to build upon functional gains made in therapy. ALL STGS DUE 12/03/20    Baseline 12/02/20: met with current HEP, updated HEP this session    Time --    Period --    Status Achieved    Target Date --      PT SHORT TERM GOAL #2   Title Pt will be able to perform a sliding board transfer with mod I in order to demo improved independence.    Baseline 12/02/20: pt performs lateral transfers supervision to Mod I with no board needed    Time --    Period --    Status Achieved      PT SHORT TERM GOAL #3   Title Pt will tolerate standing in [place]/ at the sink for a least 2 minutes with min A in order to demo improved standing tolerance.    Baseline 12/02/20: met in session today    Time --    Period --    Status Achieved      PT SHORT TERM GOAL #4   Title Pt will perform sit <> stand at countertop/[place] with min A x1 in order to demo improved functional BLE strength.    Baseline 12/02/20: met in session today    Time --    Period --    Status Achieved      PT SHORT TERM GOAL #5   Title Pt will perform bed mobility with CGA in order to decr caregiver burden.    Baseline 12/02/20: met in session today with HEP review    Time --    Period --    Status Achieved             PT Long Term Goals - 11/05/20 0944      PT LONG TERM GOAL #1   Title Pt and pt's caregiver will be independent with final HEP in order to build upon functional gains made in therapy. ALL LTGS DUE 12/31/20    Time 8    Period Weeks    Status New    Target Date 12/31/20      PT LONG TERM GOAL #2   Title Pt will be able to perform squat pivot transfer with CGA in order to demo improved  transfers for decr caregiver burden.    Time 8    Period Weeks  Status New      PT LONG TERM GOAL #3   Title Pt will stand at [facility] for at least 3 minutes with min A in order to demo improved functional BLE strength and standing tolerance.    Time 8    Period Weeks    Status New      PT LONG TERM GOAL #4   Title Pt will perform sit <> stand with RW with min A in order to demo improved strength for transfers.    Time 8    Period Weeks    Status New      PT LONG TERM GOAL #5   Title Pt will ambulate at least 5' in // bars with min A in order to progress towards functional household mobility.    Baseline unable.    Time 8    Period Weeks    Status New              Plan - 12/17/20 1935    Clinical Impression Statement Today's skilled session continued to focus on strengthening, standing activities and gait. Was able to progress gait from parallel bars to RW this session. The pt is making steady progress toward goals and should benefit from continued PT to progress toward unmet goals.    Personal Factors and Comorbidities Comorbidity 3+;Past/Current Experience   pt very active prior to diagnosis   Comorbidities GBS, a fib, hx of covid.    Examination-Activity Limitations Bathing;Bed Mobility;Caring for [employer];Locomotion Level;Dressing;Hygiene/Grooming;Transfers;Stand;Toileting    Examination-Participation Restrictions Cleaning;Community Activity;Yard Work;Meal Prep;Shop;Laundry;Driving;Occupation   exercise, cycling   Stability/Clinical Decision Making Evolving/Moderate complexity    Rehab Potential Good    PT Frequency 2x / week   1-2x a week   PT Duration --   7-8 weeks   PT Treatment/Interventions Manual techniques;Therapeutic exercise;Gait training;Neuromuscular re-education;Aquatic Therapy;Orthotic Fit/Training    PT Next Visit Plan referal for aquatics has been submitted- Vinnie Level or Langley Gauss to call pt. try use of anterior [place]ane braces with gait- in parallel  bars working to [employer]- have second person with RW; continue with strengthening, standing balance with decreased UE support.    Consulted and Agree with Plan of Care Patient;Family member/caregiver    Family Member Consulted pt's wife and daughter.             Patient will benefit from skilled therapeutic intervention in order to improve the following deficits and impairments:  Decreased activity tolerance,Decreased coordination,Decreased balance,Decreased endurance,Decreased range of motion,Decreased strength,Difficulty walking,Impaired sensation,Postural dysfunction  Visit Diagnosis: Difficulty in walking, not elsewhere classified  Muscle weakness (generalized)  Other symptoms and signs involving the nervous system  Abnormal posture     Problem List Patient Active Problem List   Diagnosis Date Noted  . Acute on chronic respiratory failure with hypoxia ([place])   . Guillain Barr syndrome ([place])   . Paroxysmal atrial fibrillation (HCC)   . Autonomic dysfunction   . Severe sepsis ([place])    Willow Ora, PTA, [facility] [address], [place], [place] [address] [phone] 12/17/20, 7:35 PM   Name: Joseph Mayo MRN: [id number] Date of Birth: 04-21-1946

## 2020-12-21 ENCOUNTER — Ambulatory Visit: Payer: No Typology Code available for payment source | Attending: Family Medicine | Admitting: Physical Therapy

## 2020-12-21 ENCOUNTER — Other Ambulatory Visit: Payer: Self-pay

## 2020-12-21 ENCOUNTER — Encounter: Payer: Self-pay | Admitting: Physical Therapy

## 2020-12-21 DIAGNOSIS — R29818 Other symptoms and signs involving the nervous system: Secondary | ICD-10-CM | POA: Insufficient documentation

## 2020-12-21 DIAGNOSIS — R262 Difficulty in walking, not elsewhere classified: Secondary | ICD-10-CM | POA: Diagnosis not present

## 2020-12-21 DIAGNOSIS — M6281 Muscle weakness (generalized): Secondary | ICD-10-CM | POA: Insufficient documentation

## 2020-12-21 DIAGNOSIS — R293 Abnormal posture: Secondary | ICD-10-CM | POA: Diagnosis present

## 2020-12-22 NOTE — Therapy (Signed)
[facility] [address] [place], [place], [address] Phone: [phone]   Fax:  [phone]  Physical Therapy Treatment  Patient Details  Name: Joseph Mayo MRN: [id number] Date of Birth: 1945/12/11 Referring Provider (PT): Jacky Kindle A   Encounter Date: 12/21/2020   PT End of Session - 12/22/20 1029    Visit Number 12    Number of Visits 16    Authorization Type [facility]    Authorization - Visit Number 11    Authorization - Number of Visits 15    PT Start Time 1401    PT Stop Time 1445    PT Time Calculation (min) 44 min    Equipment Utilized During Treatment Gait belt    Activity Tolerance Patient tolerated treatment well    Behavior During Therapy WFL for tasks assessed/performed           Past Medical History:  Diagnosis Date  . Acute on chronic respiratory failure with hypoxia (HCC)   . Autonomic dysfunction   . Guillain Barr syndrome (HCC)   . Paroxysmal atrial fibrillation (HCC)   . Severe sepsis (HCC)     History reviewed. No pertinent surgical history.  There were no vitals filed for this visit.   Subjective Assessment - 12/21/20 1404    Subjective Took some steps over the weekend with 3 family members present, a w/c follow and with his RW.Had a tiny bit of soreness in his back after last visit.    Patient is accompained by: Family member   daughter and spouse   Pertinent History GBS, a fib, hx of covid    Limitations Standing;Walking;Sitting    How long can you stand comfortably? about 1-2 minutes at the countertop with daughter assisting.    Patient Stated Goals wants to get up and walk.    Currently in Pain? No/denies                             [facility] Adult PT Treatment/Exercise - 12/21/20 1439      Transfers   Transfers Sit to Stand;Stand to Sit    Sit to Stand 4: Min assist;With upper extremity assist;From chair/3-in-1;3: Mod assist    Sit to Stand Details  (indicate cue type and reason) from w/c in // bars, needing min A to stand and cues/assist for nose over toes for incr forward weight shifting when standing as pt very reliant on BUE to stand x4 reps total. from w/c > RW, needing mod A to assist to stand and therapist helping to steady RW initially in standing x2 reps    Stand to Sit 4: Min assist;With upper extremity assist;To chair/3-in-1    Stand to Sit Details for controlled descent back into w/c      Ambulation/Gait   Ambulation/Gait Yes    Ambulation/Gait Assistance 4: Min assist;3: Mod assist    Ambulation/Gait Assistance Details in // bars - PT seated on stool to help block B knees with close w/c follow from PT [facility], min A for balance and weight shifting, pt able to advance BLEs with use of AFOs - performed x2 reps of gait in bars. performed gait with B AFOs and RW with close w/c follow, and 2nd PT providing CGA throughout during 2 bouts. 1st PT providing min/mod A during gait. pt able to advance RW with gait, pt reporting he felt his L knee might buckle, but no buckling noted  Ambulation Distance (Feet) 40 Feet   x1, 20 x 1, 8'  x 2   Assistive device Rolling walker;Parallel bars    Gait Pattern Step-to pattern;Step-through pattern;Decreased stride length;Decreased step length - right;Decreased step length - left;Decreased dorsiflexion - right;Decreased dorsiflexion - left;Right steppage;Left steppage;Narrow base of support    Ambulation Surface Level;Indoor    Pre-Gait Activities standing in // bars: alternating lifting UEs x5 reps B, lateral weight shifting x10 reps, 2 x 5 reps mini squats, working on tall posture with cues for glute activation and tall posture, PT providing min A and cues for weight shifting/knee and hip extension and therapist blocking B knees    Gait Comments donned B anterior allard AFOs prior to pre gait and gait training in // bars                    PT Short Term Goals - 12/02/20 1454      PT SHORT  TERM GOAL #1   Title Pt and pt's caregiver will be independent with initial HEP in order to build upon functional gains made in therapy. ALL STGS DUE 12/03/20    Baseline 12/02/20: met with current HEP, updated HEP this session    Time --    Period --    Status Achieved    Target Date --      PT SHORT TERM GOAL #2   Title Pt will be able to perform a sliding board transfer with mod I in order to demo improved independence.    Baseline 12/02/20: pt performs lateral transfers supervision to Mod I with no board needed    Time --    Period --    Status Achieved      PT SHORT TERM GOAL #3   Title Pt will tolerate standing in [place]/ at the sink for a least 2 minutes with min A in order to demo improved standing tolerance.    Baseline 12/02/20: met in session today    Time --    Period --    Status Achieved      PT SHORT TERM GOAL #4   Title Pt will perform sit <> stand at countertop/Stedy with min A x1 in order to demo improved functional BLE strength.    Baseline 12/02/20: met in session today    Time --    Period --    Status Achieved      PT SHORT TERM GOAL #5   Title Pt will perform bed mobility with CGA in order to decr caregiver burden.    Baseline 12/02/20: met in session today with HEP review    Time --    Period --    Status Achieved             PT Long Term Goals - 11/05/20 0944      PT LONG TERM GOAL #1   Title Pt and pt's caregiver will be independent with final HEP in order to build upon functional gains made in therapy. ALL LTGS DUE 12/31/20    Time 8    Period Weeks    Status New    Target Date 12/31/20      PT LONG TERM GOAL #2   Title Pt will be able to perform squat pivot transfer with CGA in order to demo improved transfers for decr caregiver burden.    Time 8    Period Weeks    Status New      PT LONG TERM GOAL #  3   Title Pt will stand at [facility] for at least 3 minutes with min A in order to demo improved functional BLE strength and standing tolerance.     Time 8    Period Weeks    Status New      PT LONG TERM GOAL #4   Title Pt will perform sit <> stand with RW with min A in order to demo improved strength for transfers.    Time 8    Period Weeks    Status New      PT LONG TERM GOAL #5   Title Pt will ambulate at least 5' in // bars with min A in order to progress towards functional household mobility.    Baseline unable.    Time 8    Period Weeks    Status New                 Plan - 12/22/20 1039    Clinical Impression Statement Continued to focus on gait training and standing tolerance/activities. Trialed use of B anterior AFOs today during gait in // bars and RW - pt demonstrating improved foot clearance B, with pt able to advance legs without therapist assistance today. Pt is making excellent progress with PT, will continue to progress towards LTGs.    Personal Factors and Comorbidities Comorbidity 3+;Past/Current Experience   pt very active prior to diagnosis   Comorbidities GBS, a fib, hx of covid.    Examination-Activity Limitations Bathing;Bed Mobility;Caring for [employer];Locomotion Level;Dressing;Hygiene/Grooming;Transfers;Stand;Toileting    Examination-Participation Restrictions Cleaning;Community Activity;Yard Work;Meal Prep;Shop;Laundry;Driving;Occupation   exercise, cycling   Stability/Clinical Decision Making Evolving/Moderate complexity    Rehab Potential Good    PT Frequency 2x / week   1-2x a week   PT Duration --   7-8 weeks   PT Treatment/Interventions Manual techniques;Therapeutic exercise;Gait training;Neuromuscular re-education;Aquatic Therapy;Orthotic Fit/Training    PT Next Visit Plan referal for aquatics has been submitted- Vinnie Level or Langley Gauss to call pt. trial use of anterior [place] and ottobock AFOs. - in parallel bars working to [employer]- have second person with RW; continue with strengthening, standing balance with decreased UE support. any update on [place] auth??    Consulted and Agree with Plan of  Care Patient;Family member/caregiver    Family Member Consulted pt's wife and daughter.           Patient will benefit from skilled therapeutic intervention in order to improve the following deficits and impairments:  Decreased activity tolerance,Decreased coordination,Decreased balance,Decreased endurance,Decreased range of motion,Decreased strength,Difficulty walking,Impaired sensation,Postural dysfunction  Visit Diagnosis: Difficulty in walking, not elsewhere classified  Muscle weakness (generalized)  Other symptoms and signs involving the nervous system     Problem List Patient Active Problem List   Diagnosis Date Noted  . Acute on chronic respiratory failure with hypoxia ([place])   . Guillain Barr syndrome ([place])   . Paroxysmal atrial fibrillation (HCC)   . Autonomic dysfunction   . Severe sepsis ([place])     Arliss Journey, PT, DPT  12/22/2020, 10:40 AM  [facility] [address] [place], [place], [address] Phone: [phone]   Fax:  [phone]  Name: Joseph Mayo MRN: [id number] Date of Birth: April 19, 1946

## 2020-12-23 ENCOUNTER — Encounter: Payer: Self-pay | Admitting: Physical Therapy

## 2020-12-23 ENCOUNTER — Ambulatory Visit: Payer: No Typology Code available for payment source | Admitting: Physical Therapy

## 2020-12-23 ENCOUNTER — Other Ambulatory Visit: Payer: Self-pay

## 2020-12-23 DIAGNOSIS — M6281 Muscle weakness (generalized): Secondary | ICD-10-CM

## 2020-12-23 DIAGNOSIS — R29818 Other symptoms and signs involving the nervous system: Secondary | ICD-10-CM

## 2020-12-23 DIAGNOSIS — R262 Difficulty in walking, not elsewhere classified: Secondary | ICD-10-CM | POA: Diagnosis not present

## 2020-12-23 DIAGNOSIS — R293 Abnormal posture: Secondary | ICD-10-CM

## 2020-12-23 NOTE — Therapy (Signed)
[facility] [address] [place], [place], [address] Phone: [phone]   Fax:  [phone]  Physical Therapy Treatment  Patient Details  Name: Joseph Mayo MRN: [id number] Date of Birth: 1946-07-27 Referring Provider (PT): Greer Pickerel A   Encounter Date: 12/23/2020   PT End of Session - 12/23/20 1745    Visit Number 13    Number of Visits 16    Authorization Type VA    Authorization - Visit Number 12    Authorization - Number of Visits 15    PT Start Time 7989    PT Stop Time 1446    PT Time Calculation (min) 43 min    Equipment Utilized During Treatment Gait belt    Activity Tolerance Patient tolerated treatment well    Behavior During Therapy WFL for tasks assessed/performed           Past Medical History:  Diagnosis Date  . Acute on chronic respiratory failure with hypoxia ([place])   . Autonomic dysfunction   . Guillain Barr syndrome ([place])   . Paroxysmal atrial fibrillation (HCC)   . Severe sepsis ([place])     History reviewed. No pertinent surgical history.  There were no vitals filed for this visit.                      [place] Adult PT Treatment/Exercise - 12/23/20 1443      Transfers   Transfers Sit to Stand;Stand to Sit    Sit to Stand 4: Min assist;With upper extremity assist;From chair/3-in-1;3: Mod assist    Sit to Stand Details (indicate cue type and reason) from w/c in // bars, needing min A to stand and cues/assist for nose over toes for incr forward weight shifting x1 rep. from w/c > RW, needing mod A to assist to stand and therapist helping to steady RW initially in standing x2 reps    Stand to Sit 4: Min assist;With upper extremity assist;To chair/3-in-1;4: Min guard    Stand to Sit Details for controlled descent to w/c      Ambulation/Gait   Ambulation/Gait Yes    Ambulation/Gait Assistance 4: Min assist;4: Min guard    Ambulation/Gait Assistance Details in //  bars; with use of B anterior ottobock AFOs x8' with cues for weight shifting and therapist anteriorly to help block knees as needed. performed 1st bout of gait with B anterior ottobock AFOs with close w/c follow and 2nd therapist providing CGA, cued for looking ahead for more upright posture, pt with decr step length and incr difficulty clearing feet at times, performed 2nd bout with same assistance with B anterior allard toe up AFOs with pt dmeonstrating improved step length B and foot clearance - pt also reporting that these felt better during gait, no episodes of knee buckling    Ambulation Distance (Feet) 30 Feet   x1, 50 x 1   Assistive device Rolling walker;Parallel bars    Gait Pattern Step-to pattern;Step-through pattern;Decreased stride length;Decreased step length - right;Decreased step length - left;Decreased dorsiflexion - right;Decreased dorsiflexion - left;Right steppage;Left steppage;Narrow base of support    Ambulation Surface Level;Indoor    Pre-Gait Activities standing in // bars prior to gait: x6 reps B alternating marching with therapist blocking knees, cues for weight shift and wider BOS      Therapeutic Activites    Therapeutic Activities Other Therapeutic Activities    Other Therapeutic Activities discussed scheduling additional visits due to pt having 15  [place] visits, still have not heard back for additional [place] auth, therapist filling out prescription for DME and either faxing it to [place] or pt taking it with him to a [place] appt and pt needing a face to face visit with info needed on why he needs an AFO or trying to schedule a virtual visit                  PT Education - 12/23/20 1744    Education Details see TA, scheduling remainder of appts for 1st initial approved visits for TA.    Person(s) Educated Patient;Spouse;Child(ren)    Methods Explanation    Comprehension Verbalized understanding            PT Short Term Goals - 12/02/20 1454      PT SHORT TERM GOAL #1    Title Pt and pt's caregiver will be independent with initial HEP in order to build upon functional gains made in therapy. ALL STGS DUE 12/03/20    Baseline 12/02/20: met with current HEP, updated HEP this session    Time --    Period --    Status Achieved    Target Date --      PT SHORT TERM GOAL #2   Title Pt will be able to perform a sliding board transfer with mod I in order to demo improved independence.    Baseline 12/02/20: pt performs lateral transfers supervision to Mod I with no board needed    Time --    Period --    Status Achieved      PT SHORT TERM GOAL #3   Title Pt will tolerate standing in [place]/ at the sink for a least 2 minutes with min A in order to demo improved standing tolerance.    Baseline 12/02/20: met in session today    Time --    Period --    Status Achieved      PT SHORT TERM GOAL #4   Title Pt will perform sit <> stand at countertop/Stedy with min A x1 in order to demo improved functional BLE strength.    Baseline 12/02/20: met in session today    Time --    Period --    Status Achieved      PT SHORT TERM GOAL #5   Title Pt will perform bed mobility with CGA in order to decr caregiver burden.    Baseline 12/02/20: met in session today with HEP review    Time --    Period --    Status Achieved             PT Long Term Goals - 11/05/20 0944      PT LONG TERM GOAL #1   Title Pt and pt's caregiver will be independent with final HEP in order to build upon functional gains made in therapy. ALL LTGS DUE 12/31/20    Time 8    Period Weeks    Status New    Target Date 12/31/20      PT LONG TERM GOAL #2   Title Pt will be able to perform squat pivot transfer with CGA in order to demo improved transfers for decr caregiver burden.    Time 8    Period Weeks    Status New      PT LONG TERM GOAL #3   Title Pt will stand at [facility] for at least 3 minutes with min A in order to demo improved functional BLE strength and  standing tolerance.    Time 8    Period  Weeks    Status New      PT LONG TERM GOAL #4   Title Pt will perform sit <> stand with RW with min A in order to demo improved strength for transfers.    Time 8    Period Weeks    Status New      PT LONG TERM GOAL #5   Title Pt will ambulate at least 5' in // bars with min A in order to progress towards functional household mobility.    Baseline unable.    Time 8    Period Weeks    Status New                 Plan - 12/23/20 1746    Clinical Impression Statement Trialed anterior ottobock AFO today B with pt demonstrating decr step length B and incr difficulty clearing BLE. With use of allard anterior toe off braces during 2nd lap, pt able to ambulate his farthest distance of 46' with pt demonstrating improved step length and improved knee control. Pt also reporting these felt better. Pt is making excellent progress, will continue to progress towards LTGs.    Personal Factors and Comorbidities Comorbidity 3+;Past/Current Experience   pt very active prior to diagnosis   Comorbidities GBS, a fib, hx of covid.    Examination-Activity Limitations Bathing;Bed Mobility;Caring for [employer];Locomotion Level;Dressing;Hygiene/Grooming;Transfers;Stand;Toileting    Examination-Participation Restrictions Cleaning;Community Activity;Yard Work;Meal Prep;Shop;Laundry;Driving;Occupation   exercise, cycling   Stability/Clinical Decision Making Evolving/Moderate complexity    Rehab Potential Good    PT Frequency 2x / week   1-2x a week   PT Duration --   7-8 weeks   PT Treatment/Interventions Manual techniques;Therapeutic exercise;Gait training;Neuromuscular re-education;Aquatic Therapy;Orthotic Fit/Training    PT Next Visit Plan referal for aquatics has been submitted- Vinnie Level or Langley Gauss to call pt. give AFO referral request.  gait with RW and B anterior allard toe up; continue with strengthening, standing balance with decreased UE support. any update on [place] auth??    Consulted and Agree  with Plan of Care Patient;Family member/caregiver    Family Member Consulted pt's wife and daughter.           Patient will benefit from skilled therapeutic intervention in order to improve the following deficits and impairments:  Decreased activity tolerance,Decreased coordination,Decreased balance,Decreased endurance,Decreased range of motion,Decreased strength,Difficulty walking,Impaired sensation,Postural dysfunction  Visit Diagnosis: Difficulty in walking, not elsewhere classified  Muscle weakness (generalized)  Other symptoms and signs involving the nervous system  Abnormal posture     Problem List Patient Active Problem List   Diagnosis Date Noted  . Acute on chronic respiratory failure with hypoxia ([place])   . Guillain Barr syndrome ([place])   . Paroxysmal atrial fibrillation (HCC)   . Autonomic dysfunction   . Severe sepsis ([place])     Arliss Journey, PT, DPT  12/23/2020, 5:53 PM  [facility] [address] [place], [place], [address] Phone: [phone]   Fax:  [phone]  Name: Joseph Mayo MRN: [id number] Date of Birth: Jun 04, 1946

## 2020-12-28 ENCOUNTER — Ambulatory Visit: Payer: No Typology Code available for payment source | Admitting: Physical Therapy

## 2020-12-28 ENCOUNTER — Other Ambulatory Visit: Payer: Self-pay

## 2020-12-28 ENCOUNTER — Encounter: Payer: Self-pay | Admitting: Physical Therapy

## 2020-12-28 DIAGNOSIS — R262 Difficulty in walking, not elsewhere classified: Secondary | ICD-10-CM

## 2020-12-28 DIAGNOSIS — M6281 Muscle weakness (generalized): Secondary | ICD-10-CM

## 2020-12-28 DIAGNOSIS — R29818 Other symptoms and signs involving the nervous system: Secondary | ICD-10-CM

## 2020-12-28 NOTE — Therapy (Signed)
[facility] [address] [place], [place], [address] Phone: [phone]   Fax:  [phone]  Physical Therapy Treatment  Patient Details  Name: Joseph Mayo MRN: [id number] Date of Birth: 09/15/1945 Referring Provider (PT): Greer Pickerel A   Encounter Date: 12/28/2020   PT End of Session - 12/28/20 1645    Visit Number 14    Number of Visits 16    Authorization Type [facility]    Authorization - Visit Number 13    Authorization - Number of Visits 15    PT Start Time 1401    PT Stop Time 1443    PT Time Calculation (min) 42 min    Equipment Utilized During Treatment Gait belt    Activity Tolerance Patient tolerated treatment well    Behavior During Therapy WFL for tasks assessed/performed           Past Medical History:  Diagnosis Date  . Acute on chronic respiratory failure with hypoxia ([place])   . Autonomic dysfunction   . Guillain Barr syndrome ([place])   . Paroxysmal atrial fibrillation (HCC)   . Severe sepsis ([place])     History reviewed. No pertinent surgical history.  There were no vitals filed for this visit.   Subjective Assessment - 12/28/20 1404    Subjective No changes since he was last here. Sees PCP tomorrow for face to face visit for AFO. PT giving pt's daughter referral request for B AFOs.    Patient is accompained by: Family member   daughter and spouse   Pertinent History GBS, a fib, hx of covid    Limitations Standing;Walking;Sitting    How long can you stand comfortably? about 1-2 minutes at the countertop with daughter assisting.    Patient Stated Goals wants to get up and walk.    Currently in Pain? No/denies                             [facility] Adult PT Treatment/Exercise - 12/28/20 1407      Transfers   Transfers Sit to Stand;Stand to Sit    Sit to Stand 4: Min assist;With upper extremity assist;From chair/3-in-1;3: Mod assist    Sit to Stand Details (indicate cue  type and reason) from w/c to RW x2 reps, with needing assist for balance in standing and to steady RW once in standing, pt with tendency to have incr weight in his heels when standing    Stand to Sit 4: Min assist;With upper extremity assist;To chair/3-in-1;4: Min guard    Stand to Sit Details x2 reps, for controlled descent from RW> w/c      Ambulation/Gait   Ambulation/Gait Yes    Ambulation/Gait Assistance 4: Min assist;4: Min guard    Ambulation/Gait Assistance Details w/c follow for safety. 1st therapist providing min A and needing assist with RW propulsion, 2nd therapist providing CGA, manual cues at times for incr weight shift for incr step length    Ambulation Distance (Feet) 20 Feet   x1, 33 x 1   Assistive device Rolling walker    Gait Pattern Step-to pattern;Step-through pattern;Decreased stride length;Decreased step length - right;Decreased step length - left;Decreased dorsiflexion - right;Decreased dorsiflexion - left;Right steppage;Left steppage;Narrow base of support    Ambulation Surface Level;Indoor    Gait Comments total A needed to don B anterior allard AFOs      Knee/Hip Exercises: Aerobic   Other Aerobic Pt seated in w/c  with anti tipper blocks behind wheels performed at level 2.0 for 6 minutes for ROM, strengthening, and activity tolerance with BLE and BUE - (3 bouts of 1 minute each with just BLE only)      Knee/Hip Exercises: Seated   Long Arc Quad Strengthening;Both;1 set;10 reps;Limitations;AROM    Long Arc Quad Limitations visual cues on how high to kick leg, initial cues for proper technique    Ball Squeeze x10 reps with use of ball    Abduction/Adduction  Strengthening;AROM    Abd/Adduction Limitations x10 reps single leg with green tband around thighs for hip ABD, then x10 reps B hip ABD. cues for 3 second hold at end range                    PT Short Term Goals - 12/02/20 1454      PT SHORT TERM GOAL #1   Title Pt and pt's caregiver will be  independent with initial HEP in order to build upon functional gains made in therapy. ALL STGS DUE 12/03/20    Baseline 12/02/20: met with current HEP, updated HEP this session    Time --    Period --    Status Achieved    Target Date --      PT SHORT TERM GOAL #2   Title Pt will be able to perform a sliding board transfer with mod I in order to demo improved independence.    Baseline 12/02/20: pt performs lateral transfers supervision to Mod I with no board needed    Time --    Period --    Status Achieved      PT SHORT TERM GOAL #3   Title Pt will tolerate standing in [place]/ at the sink for a least 2 minutes with min A in order to demo improved standing tolerance.    Baseline 12/02/20: met in session today    Time --    Period --    Status Achieved      PT SHORT TERM GOAL #4   Title Pt will perform sit <> stand at countertop/Stedy with min A x1 in order to demo improved functional BLE strength.    Baseline 12/02/20: met in session today    Time --    Period --    Status Achieved      PT SHORT TERM GOAL #5   Title Pt will perform bed mobility with CGA in order to decr caregiver burden.    Baseline 12/02/20: met in session today with HEP review    Time --    Period --    Status Achieved             PT Long Term Goals - 11/05/20 0944      PT LONG TERM GOAL #1   Title Pt and pt's caregiver will be independent with final HEP in order to build upon functional gains made in therapy. ALL LTGS DUE 12/31/20    Time 8    Period Weeks    Status New    Target Date 12/31/20      PT LONG TERM GOAL #2   Title Pt will be able to perform squat pivot transfer with CGA in order to demo improved transfers for decr caregiver burden.    Time 8    Period Weeks    Status New      PT LONG TERM GOAL #3   Title Pt will stand at [facility] for at least 3 minutes  with min A in order to demo improved functional BLE strength and standing tolerance.    Time 8    Period Weeks    Status New      PT LONG  TERM GOAL #4   Title Pt will perform sit <> stand with RW with min A in order to demo improved strength for transfers.    Time 8    Period Weeks    Status New      PT LONG TERM GOAL #5   Title Pt will ambulate at least 5' in // bars with min A in order to progress towards functional household mobility.    Baseline unable.    Time 8    Period Weeks    Status New                 Plan - 12/28/20 1646    Clinical Impression Statement Pt's BLE more tired today after beginning session with use of SciFit and bouts of only using BLE. Unable to progress distance today with gait. Continued to use B anterior allard toe off AFOs, with pt demonstrating improved step length during 2nd bout of gait. Continues to need close w/c follow during gait and one therapist providing min A, with 2nd therapist providing CGA. Will continue to progress towards LTGs.    Personal Factors and Comorbidities Comorbidity 3+;Past/Current Experience   pt very active prior to diagnosis   Comorbidities GBS, a fib, hx of covid.    Examination-Activity Limitations Bathing;Bed Mobility;Caring for [employer];Locomotion Level;Dressing;Hygiene/Grooming;Transfers;Stand;Toileting    Examination-Participation Restrictions Cleaning;Community Activity;Yard Work;Meal Prep;Shop;Laundry;Driving;Occupation   exercise, cycling   Stability/Clinical Decision Making Evolving/Moderate complexity    Rehab Potential Good    PT Frequency 2x / week   1-2x a week   PT Duration --   7-8 weeks   PT Treatment/Interventions Manual techniques;Therapeutic exercise;Gait training;Neuromuscular re-education;Aquatic Therapy;Orthotic Fit/Training    PT Next Visit Plan referal for aquatics has been submitted- Vinnie Level or Langley Gauss to call pt. give AFO referral request.  how was appt with PCP?  gait with RW and B anterior allard toe off; continue with strengthening, standing balance with decreased UE support. any update on [place] auth??    Consulted and  Agree with Plan of Care Patient;Family member/caregiver    Family Member Consulted pt's wife and daughter.           Patient will benefit from skilled therapeutic intervention in order to improve the following deficits and impairments:  Decreased activity tolerance,Decreased coordination,Decreased balance,Decreased endurance,Decreased range of motion,Decreased strength,Difficulty walking,Impaired sensation,Postural dysfunction  Visit Diagnosis: Difficulty in walking, not elsewhere classified  Muscle weakness (generalized)  Other symptoms and signs involving the nervous system     Problem List Patient Active Problem List   Diagnosis Date Noted  . Acute on chronic respiratory failure with hypoxia ([place])   . Guillain Barr syndrome ([place])   . Paroxysmal atrial fibrillation (HCC)   . Autonomic dysfunction   . Severe sepsis ([place])     Lillia Pauls, DPT  12/28/2020, 4:50 PM  [facility] [address] [place], [place], [address] Phone: [phone]   Fax:  [phone]  Name: DIONTRE HARPS MRN: [id number] Date of Birth: [birth year]

## 2021-01-01 ENCOUNTER — Ambulatory Visit: Payer: No Typology Code available for payment source | Admitting: Physical Therapy

## 2021-01-01 ENCOUNTER — Other Ambulatory Visit: Payer: Self-pay

## 2021-01-01 ENCOUNTER — Encounter: Payer: Self-pay | Admitting: Physical Therapy

## 2021-01-01 DIAGNOSIS — R29818 Other symptoms and signs involving the nervous system: Secondary | ICD-10-CM

## 2021-01-01 DIAGNOSIS — R262 Difficulty in walking, not elsewhere classified: Secondary | ICD-10-CM | POA: Diagnosis not present

## 2021-01-01 DIAGNOSIS — R293 Abnormal posture: Secondary | ICD-10-CM

## 2021-01-01 DIAGNOSIS — M6281 Muscle weakness (generalized): Secondary | ICD-10-CM

## 2021-01-01 NOTE — Therapy (Addendum)
[facility] [address] [place], [place], [address] Phone: [phone]   Fax:  [phone]  Physical Therapy Treatment  Patient Details  Name: Joseph Mayo MRN: [id number] Date of Birth: 03-Mar-1946 Referring Provider (PT): Greer Pickerel A   Encounter Date: 01/01/2021   PT End of Session - 01/01/21 1626    Visit Number 15    Number of Visits 16    Authorization Type VA    Authorization - Visit Number 14    Authorization - Number of Visits 15    PT Start Time 0539    PT Stop Time 1445    PT Time Calculation (min) 42 min    Equipment Utilized During Treatment Gait belt    Activity Tolerance Patient tolerated treatment well    Behavior During Therapy WFL for tasks assessed/performed           Past Medical History:  Diagnosis Date  . Acute on chronic respiratory failure with hypoxia ([place])   . Autonomic dysfunction   . Guillain Barr syndrome ([place])   . Paroxysmal atrial fibrillation (HCC)   . Severe sepsis ([place])     History reviewed. No pertinent surgical history.  There were no vitals filed for this visit.   Subjective Assessment - 01/01/21 1406    Subjective Has to see the podiatrist before he can get both of his AFOs. Has the appointment on the 27th. Also will have an OT evaluation at the [place] next week.    Patient is accompained by: Family member   daughter and spouse   Pertinent History GBS, a fib, hx of covid    Limitations Standing;Walking;Sitting    How long can you stand comfortably? about 1-2 minutes at the countertop with daughter assisting.    Patient Stated Goals wants to get up and walk.    Currently in Pain? No/denies                            01/01/21 1423  Transfers  Transfers Sit to Stand;Stand to Sit  Sit to Stand 4: Min assist;With upper extremity assist;From chair/3-in-1;3: Mod assist  Sit to Stand Details (indicate cue type and reason) from elevated mat table  to RW - needs assist to fully come to stand and to steady RW, pt standing initially in a forward flexed position with B knee flexion, needs assist to fully stand tall, x2 reps total performed  Stand to Sit 4: Min assist;With upper extremity assist;To chair/3-in-1;4: Min guard  Stand to Sit Details cues to reach behind for slowed descent  Ambulation/Gait  Ambulation/Gait Yes  Ambulation/Gait Assistance 4: Min guard;4: Min assist  Ambulation/Gait Assistance Details w/c folllow for safey, performed with B allard anterior AFOs, 2nd therapist providing CGA, cues for posture, towards end of bout needing intermittent min A for RLE foot clearance  Ambulation Distance (Feet) 115 Feet  Assistive device Rolling walker  Gait Pattern Step-to pattern;Step-through pattern;Decreased stride length;Decreased step length - right;Decreased step length - left;Decreased dorsiflexion - right;Decreased dorsiflexion - left;Right steppage;Left steppage;Narrow base of support  Ambulation Surface Level;Indoor  Pre-Gait Activities standing at [facility] for 3 minutes and 30 seconds: glute squeezes x10 reps, quad sets x10 reps , alternating stepping forward x5 reps B - with cues for quad activation when stepping  Knee/Hip Exercises: Supine  Short Arc [employer] Strengthening;AAROM;Both;1 set;10 reps  Short Arc [employer] Limitations with 1# ankle weight, needs assist at times for  controlled descent  [place] AROM;Strengthening;Both;1 set;10 reps;Limitations  [place] Limitations with red pball under legs and cues for full pelvi lifting with each rep.  Straight Leg Raises Strengthening;AROM;Both;1 set;10 reps  Straight Leg Raises Limitations cues for technique to perform quad set and therapist helping to assist with lifting leg and for controlled descent   Other Supine Knee/Hip Exercises with BLE on red pball - digging in heels and kicking out and then slowly pulling ball back in, therapist assisting at times for proper position of ball.              PT Short Term Goals - 12/02/20 1454      PT SHORT TERM GOAL #1   Title Pt and pt's caregiver will be independent with initial HEP in order to build upon functional gains made in therapy. ALL STGS DUE 12/03/20    Baseline 12/02/20: met with current HEP, updated HEP this session    Time --    Period --    Status Achieved    Target Date --      PT SHORT TERM GOAL #2   Title Pt will be able to perform a sliding board transfer with mod I in order to demo improved independence.    Baseline 12/02/20: pt performs lateral transfers supervision to Mod I with no board needed    Time --    Period --    Status Achieved      PT SHORT TERM GOAL #3   Title Pt will tolerate standing in Stedy/ at the sink for a least 2 minutes with min A in order to demo improved standing tolerance.    Baseline 12/02/20: met in session today    Time --    Period --    Status Achieved      PT SHORT TERM GOAL #4   Title Pt will perform sit <> stand at countertop/Stedy with min A x1 in order to demo improved functional BLE strength.    Baseline 12/02/20: met in session today    Time --    Period --    Status Achieved      PT SHORT TERM GOAL #5   Title Pt will perform bed mobility with CGA in order to decr caregiver burden.    Baseline 12/02/20: met in session today with HEP review    Time --    Period --    Status Achieved            PT Long Term Goals - 01/01/21 1434      PT LONG TERM GOAL #1   Title Pt and pt's caregiver will be independent with final HEP in order to build upon functional gains made in therapy. ALL LTGS DUE 12/31/20    Time 8    Period Weeks    Status New      PT LONG TERM GOAL #2   Title Pt will be able to perform squat pivot transfer with CGA in order to demo improved transfers for decr caregiver burden.    Baseline performs with supervision from w/c <> mat table    Time 8    Period Weeks    Status Achieved      PT LONG TERM GOAL #3   Title Pt will stand at [facility]  for at least 3 minutes with min A in order to demo improved functional BLE strength and standing tolerance.    Baseline 3:30 with min guard with RW    Time 8  Period Weeks    Status Achieved      PT LONG TERM GOAL #4   Title Pt will perform sit <> stand with RW with min A in order to demo improved strength for transfers.    Time 8    Period Weeks    Status New      PT LONG TERM GOAL #5   Title Pt will ambulate at least 5' in // bars with min A in order to progress towards functional household mobility.    Baseline able to ambulate 115' with RW with min A    Time 8    Period Weeks    Status Achieved                 01/03/21 2047  Plan  Clinical Impression Statement Began to assess pt's LTGs today. Pt met 3 out of 5 LTGs today. Able to stand at [facility] for 3 minutes and 30 seconds today with min guard once in standing. Pt able to progress walking distance today with RW to 115' with one bout, needing assist during the end for RLE foot clearance when pt got more fatigued. Pt is making excellent progress, will continue to progress towards LTGs.  Personal Factors and Comorbidities Comorbidity 3+;Past/Current Experience (pt very active prior to diagnosis)  Comorbidities GBS, a fib, hx of covid.  Examination-Activity Limitations Bathing;Bed Mobility;Caring for [employer];Locomotion Level;Dressing;Hygiene/Grooming;Transfers;Stand;Toileting  Examination-Participation Restrictions Cleaning;Community Activity;Yard Work;Meal Prep;Shop;Laundry;Driving;Occupation (exercise, cycling)  Pt will benefit from skilled therapeutic intervention in order to improve on the following deficits Decreased activity tolerance;Decreased coordination;Decreased balance;Decreased endurance;Decreased range of motion;Decreased strength;Difficulty walking;Impaired sensation;Postural dysfunction  Stability/Clinical Decision Making Evolving/Moderate complexity  Rehab Potential Good  PT Frequency 2x /  week (1-2x a week)  PT Duration  (7-8 weeks)  PT Treatment/Interventions Manual techniques;Therapeutic exercise;Gait training;Neuromuscular re-education;Aquatic Therapy;Orthotic Fit/Training  PT Next Visit Plan referal for aquatics has been submitted- Vinnie Level or Langley Gauss to call pt. check remainder of goals. gait with RW and B anterior allard toe off; continue with strengthening, standing balance with decreased UE support. any update on [place] auth??  Consulted and Agree with Plan of Care Patient;Family member/caregiver  Family Member Consulted pt's wife and daughter.       Patient will benefit from skilled therapeutic intervention in order to improve the following deficits and impairments:     Visit Diagnosis: Difficulty in walking, not elsewhere classified  Muscle weakness (generalized)  Other symptoms and signs involving the nervous system  Abnormal posture     Problem List Patient Active Problem List   Diagnosis Date Noted  . Acute on chronic respiratory failure with hypoxia ([place])   . Guillain Barr syndrome ([place])   . Paroxysmal atrial fibrillation (HCC)   . Autonomic dysfunction   . Severe sepsis ([place])     Arliss Journey, PT, DPT  01/01/2021, 4:27 PM  [facility] [address] [place], [place], [address] Phone: [phone]   Fax:  [phone]  Name: Joseph Mayo MRN: [id number] Date of Birth: Sep 04, 1945

## 2021-01-04 ENCOUNTER — Ambulatory Visit: Payer: No Typology Code available for payment source | Admitting: Physical Therapy

## 2021-01-04 ENCOUNTER — Other Ambulatory Visit: Payer: Self-pay

## 2021-01-04 DIAGNOSIS — R262 Difficulty in walking, not elsewhere classified: Secondary | ICD-10-CM

## 2021-01-04 DIAGNOSIS — R29818 Other symptoms and signs involving the nervous system: Secondary | ICD-10-CM

## 2021-01-04 DIAGNOSIS — M6281 Muscle weakness (generalized): Secondary | ICD-10-CM

## 2021-01-04 DIAGNOSIS — R293 Abnormal posture: Secondary | ICD-10-CM

## 2021-01-04 NOTE — Therapy (Addendum)
[facility] [address] [place], [place], [address] Phone: [phone]   Fax:  [phone]  Physical Therapy Treatment/Re-Cert  Patient Details  Name: Joseph Mayo MRN: [id number] Date of Birth: 1946-06-28 Referring Provider (PT): Greer Pickerel A   Encounter Date: 01/04/2021   PT End of Session - 01/04/21 1637    Visit Number 16    Number of Visits 31    Authorization Type VA    Authorization - Visit Number 15    Authorization - Number of Visits 15    PT Start Time 6962    PT Stop Time 1446    PT Time Calculation (min) 44 min    Activity Tolerance Patient tolerated treatment well    Behavior During Therapy [facility] for tasks assessed/performed           Past Medical History:  Diagnosis Date  . Acute on chronic respiratory failure with hypoxia ([place])   . Autonomic dysfunction   . Guillain Barr syndrome ([place])   . Paroxysmal atrial fibrillation (HCC)   . Severe sepsis ([place])     No past surgical history on file.  There were no vitals filed for this visit.   Subjective Assessment - 01/04/21 1641    Subjective Nothing new since he was last here.    Patient is accompained by: Family member   daughter and spouse   Pertinent History GBS, a fib, hx of covid    Limitations Standing;Walking;Sitting    How long can you stand comfortably? about 1-2 minutes at the countertop with daughter assisting.    Patient Stated Goals wants to get up and walk.    Currently in Pain? No/denies                        01/05/21 0001  Assessment  Medical Diagnosis GBS  Referring Provider (PT) Novitt, Cecelia A  Onset Date/Surgical Date 06/17/20  Precautions  Precautions Fall  Prior Function  Level of Independence Independent              Access Code: [email or web address] URL: [email or web address] Date: 01/04/2021 Prepared by: Janann August  Progressed and updated HEP - see [place] for  full details.   Program Notes standing at railing outside: perform 2 sets of 10 reps of glute squeezes. think about standing tall.    Exercises Supine Bridge - 1-2 x daily - 5 x weekly - 2 sets - 10 reps Seated Calf Stretch with Strap - 1 x daily - 5 x weekly - 3 sets - 20-30 hold Seated Ankle Plantarflexion with Resistance - 1 x daily - 5 x weekly - 1 sets - 10 reps Seated Long Arc Quad - 1 x daily - 5 x weekly - 1-2 sets - 10 reps Seated Knee Flexion Extension AROM - 2 x daily - 5 x weekly - 1-2 sets - 10 reps Hooklying Single Leg Bent Knee Fallouts with Resistance - 1 x daily - 5 x weekly - 1-2 sets - 10 reps      PT Education - 01/04/21 1641    Education Details upgraded and reviewed HEP    Person(s) Educated Patient;Spouse;Child(ren)    Methods Explanation;Demonstration;Handout    Comprehension Verbalized understanding;Returned demonstration            PT Short Term Goals - 12/02/20 1454      PT SHORT TERM GOAL #1   Title Pt and pt's caregiver will be independent with initial HEP in  order to build upon functional gains made in therapy. ALL STGS DUE 12/03/20    Baseline 12/02/20: met with current HEP, updated HEP this session    Time --    Period --    Status Achieved    Target Date --      PT SHORT TERM GOAL #2   Title Pt will be able to perform a sliding board transfer with mod I in order to demo improved independence.    Baseline 12/02/20: pt performs lateral transfers supervision to Mod I with no board needed    Time --    Period --    Status Achieved      PT SHORT TERM GOAL #3   Title Pt will tolerate standing in [place]/ at the sink for a least 2 minutes with min A in order to demo improved standing tolerance.    Baseline 12/02/20: met in session today    Time --    Period --    Status Achieved      PT SHORT TERM GOAL #4   Title Pt will perform sit <> stand at countertop/[place] with min A x1 in order to demo improved functional BLE strength.    Baseline  12/02/20: met in session today    Time --    Period --    Status Achieved      PT SHORT TERM GOAL #5   Title Pt will perform bed mobility with CGA in order to decr caregiver burden.    Baseline 12/02/20: met in session today with HEP review    Time --    Period --    Status Achieved          Revised STGs for re-cert:   PT Short Term Goals - 01/05/21 1096      PT SHORT TERM GOAL #1   Title Pt will initiate aquatic therapy. ALL STGS DUE 02/16/21    Baseline not yet started.    Time 6   due to delay in scheduling   Period Weeks    Status New    Target Date 02/16/21      PT SHORT TERM GOAL #2   Title Pt will perform sit <> stand with RW from standard height mat table and w/c with min A in order to demo improved functional transfers.    Baseline min/mod A from higher mat table.    Time 6    Status New      PT SHORT TERM GOAL #3   Title Pt will tolerate standing at RW for 4 minutes with supervision in order to demo improved standing tolerance for ADLs.    Baseline 3:30 with min guard with RW    Time 6    Period Weeks    Status New      PT SHORT TERM GOAL #4   Title Pt will perform stand pivot transfer with min A with RW and B AFOs in order to demo improved functional transfers.    Baseline not yet assessed.    Time 6    Period Weeks    Status New             PT Long Term Goals - 01/04/21 1635      PT LONG TERM GOAL #1   Title Pt and pt's caregiver will be independent with final HEP in order to build upon functional gains made in therapy. ALL LTGS DUE 12/31/20    Baseline reviewed and upgraded on 01/04/21  Time 8    Period Weeks    Status Achieved      PT LONG TERM GOAL #2   Title Pt will be able to perform squat pivot transfer with CGA in order to demo improved transfers for decr caregiver burden.    Baseline performs with supervision from w/c <> mat table    Time 8    Period Weeks    Status Achieved      PT LONG TERM GOAL #3   Title Pt will stand at [facility] for  at least 3 minutes with min A in order to demo improved functional BLE strength and standing tolerance.    Baseline 3:30 with min guard with RW    Time 8    Period Weeks    Status Achieved      PT LONG TERM GOAL #4   Title Pt will perform sit <> stand with RW with min A in order to demo improved strength for transfers.    Baseline needs min/mod A from more elevated mat table.    Time 8    Period Weeks    Status Partially Met      PT LONG TERM GOAL #5   Title Pt will ambulate at least 5' in // bars with min A in order to progress towards functional household mobility.    Baseline able to ambulate 115' with RW with min A    Time 8    Period Weeks    Status Achieved          Revised LTGs:   PT Long Term Goals - 01/04/21 1635      PT LONG TERM GOAL #1   Title Pt and pt's caregiver will be independent with final HEP in order to build upon functional gains made in therapy. ALL LTGS DUE 03/16/21    Baseline reviewed and upgraded on 01/04/21 - will continue to benefit from updates    Time 10   due to delay in scheduling   Period Weeks    Status Achieved    Target Date 03/16/21      PT LONG TERM GOAL #2   Title Pt will perform stand pivot transfer with min guard with RW and B AFOs in order to demo improved functional transfers    Baseline --    Time 10    Period Weeks    Status New      PT LONG TERM GOAL #3   Title Pt will ambulate at least 300' with RW and B AFOs with supervision in order to demo improved household mobility.    Baseline --    Time 10    Period Weeks    Status New      PT LONG TERM GOAL #4   Title Pt will ambulate at least 100' over outdoor paved surfaces with min guard with RW.    Baseline --    Time 10    Period Weeks    Status New      PT LONG TERM GOAL #5   Title Pt will perform sit <> stand with RW from standard height mat table and w/c with min guard in order to demo improved functional transfers.    Baseline --    Time 10    Period Weeks     Status New                   01/05/21 0917  Plan  Clinical Impression Statement Focus of today's  skilled session was assessing remainder of LTGs. Pt achieved LTG in regards to HEP and upgraded as appropriate today due to pt's progress. Pt partially met LTG #4 in regards to sit <> stand - needs min/mod A from more elevated mat table and PT needs to help steady the RW and help pt for balance/bringing knees into extension. Pt is able to walk 115' with RW with min A and use of B anterior allard AFOs - pt will be receiving AFOs through the [place]. Pt is making excellent progress with PT, will continue for 1-2x week for 7-8 weeks to continue to focus on strengthening, ROM, balance, gait, functional transfers in order to improve functional mobility and independence. Pt scheduled a couple weeks out to wait back to hear about [place] auth and pt will go on vacation. STGs/LTGs updated as appropriate.  Personal Factors and Comorbidities Comorbidity 3+;Past/Current Experience (pt very active prior to diagnosis)  Comorbidities GBS, a fib, hx of covid.  Examination-Activity Limitations Bathing;Bed Mobility;Caring for [employer];Locomotion Level;Dressing;Hygiene/Grooming;Transfers;Stand;Toileting  Examination-Participation Restrictions Cleaning;Community Activity;Yard Work;Meal Prep;Shop;Laundry;Driving;Occupation (exercise, cycling)  Pt will benefit from skilled therapeutic intervention in order to improve on the following deficits Decreased activity tolerance;Decreased coordination;Decreased balance;Decreased endurance;Decreased range of motion;Decreased strength;Difficulty walking;Impaired sensation;Postural dysfunction  Stability/Clinical Decision Making Evolving/Moderate complexity  Rehab Potential Good  PT Frequency 2x / week (1-2x a week)  PT Duration  (7-8 weeks)  PT Treatment/Interventions Manual techniques;Therapeutic exercise;Gait training;Neuromuscular re-education;Aquatic Therapy;Orthotic  Fit/Training  PT Next Visit Plan any update from aquatics? gait with RW and B anterior allard toe off; continue with strengthening, standing balance with decreased UE support.  PT Home Exercise Plan [email or web address]  Consulted and Agree with Plan of Care Patient;Family member/caregiver  Family Member Consulted pt's wife and daughter.      Patient will benefit from skilled therapeutic intervention in order to improve the following deficits and impairments:     Visit Diagnosis: Difficulty in walking, not elsewhere classified  Muscle weakness (generalized)  Other symptoms and signs involving the nervous system  Abnormal posture     Problem List Patient Active Problem List   Diagnosis Date Noted  . Acute on chronic respiratory failure with hypoxia ([place])   . Guillain Barr syndrome ([place])   . Paroxysmal atrial fibrillation (HCC)   . Autonomic dysfunction   . Severe sepsis ([place])     Arliss Journey, PT, DPT  01/04/2021, 4:41 PM  [facility] [address] [place], [place], [address] Phone: [phone]   Fax:  [phone]  Name: Joseph Mayo MRN: [id number] Date of Birth: 1945-11-25

## 2021-01-05 NOTE — Addendum Note (Signed)
Addended by: Drake Leach on: 01/05/2021 09:28 AM   Modules accepted: Orders

## 2021-01-25 ENCOUNTER — Other Ambulatory Visit: Payer: Self-pay

## 2021-01-25 ENCOUNTER — Encounter: Payer: Self-pay | Admitting: Physical Therapy

## 2021-01-25 ENCOUNTER — Ambulatory Visit: Payer: No Typology Code available for payment source | Attending: Family Medicine | Admitting: Physical Therapy

## 2021-01-25 DIAGNOSIS — R262 Difficulty in walking, not elsewhere classified: Secondary | ICD-10-CM | POA: Insufficient documentation

## 2021-01-25 DIAGNOSIS — R293 Abnormal posture: Secondary | ICD-10-CM | POA: Diagnosis present

## 2021-01-25 DIAGNOSIS — M6281 Muscle weakness (generalized): Secondary | ICD-10-CM | POA: Diagnosis present

## 2021-01-25 DIAGNOSIS — R29818 Other symptoms and signs involving the nervous system: Secondary | ICD-10-CM | POA: Insufficient documentation

## 2021-01-25 NOTE — Patient Instructions (Signed)
  Aquatic Therapy: What to Expect!  Where:  [facility] [address] [place], [place]  [address] [phone]  NOTE:  You will receive an automated phone message reminding you of your appointment and it will say the appointment is at the Rehab [facility] on [address]  We are working to fix this- just know that you will meet us at the pool!  How to Prepare: . Please make sure you drink 8 ounces of water about one hour prior to your pool session . A caregiver MUST attend the entire session with the patient.  The caregiver will be responsible for assisting with dressing as well as any toileting needs.  If the patient will be doing a home program this should likely be the person who will assist as well.  . Patients must wear either their street shoes or pool shoes until they are ready to enter the pool with the therapist.  Patients must also wear either street shoes or pool shoes once exiting the pool to walk to the locker room.  This will helps us prevent slips and falls.  . Please arrive 15 minutes early to prepare for your pool therapy session . Sign in at the front desk on the clipboard marked for [place] . You may use the locker rooms on your right and then enter directly into the recreation pool (NOT the competition pool) . Please make sure to attend to any toileting needs prior to entering the pool . Please be dressed in your swim suit and on the pool deck at least 5 minutes before your appointment . Once on the pool deck your therapist will ask you to sign the Patient  Consent and Assignment of Benefits form . Your therapist may take your blood pressure prior to, during and after your session if indicated  About the pool  and parking: 1. Entering the pool Your therapist will assist you; there are 2 ways to enter:  stairs with railings or with a chair lift.   Your therapist will determine the most appropriate way for you. 2. Water temperature is usually  between 86-87 degrees 3. There may be other swimmers in the pool at the same time 4. Parking is free.   Contact Info:     Appointments: [facility]  All sessions are 45 minutes   [address]  [address]     Please call the [facility] if   , [place]   [address]    you need to cancel or reschedule an appointment.  [phone]       

## 2021-01-25 NOTE — Therapy (Signed)
[facility] [address] [place], [place], [address] Phone: [phone]   Fax:  [phone]  Physical Therapy Treatment  Patient Details  Name: Joseph Mayo MRN: [id number] Date of Birth: May 19, 1946 Referring Provider (PT): Jacky Kindle A   Encounter Date: 01/25/2021   PT End of Session - 01/25/21 1626    Visit Number 17    Number of Visits 31    Authorization Type VA    Authorization - Visit Number 1    Authorization - Number of Visits 15    PT Start Time 1446    PT Stop Time 1530    PT Time Calculation (min) 44 min    Equipment Utilized During Treatment Gait belt    Activity Tolerance Patient tolerated treatment well    Behavior During Therapy WFL for tasks assessed/performed           Past Medical History:  Diagnosis Date  . Acute on chronic respiratory failure with hypoxia (HCC)   . Autonomic dysfunction   . Guillain Barr syndrome (HCC)   . Paroxysmal atrial fibrillation (HCC)   . Severe sepsis (H[facility])     History reviewed. No pertinent surgical history.  There were no vitals filed for this visit.   Subjective Assessment - 01/25/21 1456    Subjective Had a good vacation. Working on getting his B AFOs- had a visit with the [place] and is still waiting to get his braces. Had a very slow motion fall sliding out of the car. Was not hurt.    Patient is accompained by: Family member   daughter and spouse   Pertinent History GBS, a fib, hx of covid    Limitations Standing;Walking;Sitting    How long can you stand comfortably? about 1-2 minutes at the countertop with daughter assisting.    Patient Stated Goals wants to get up and walk.    Currently in Pain? No/denies                             [facility] Adult PT Treatment/Exercise - 01/25/21 1531      Transfers   Transfers Sit to Stand;Stand to Sit    Sit to Stand 4: Min assist;With upper extremity assist    Sit to Stand Details  (indicate cue type and reason) from w/c to // bars with BUE support x4 reps total, then performed from elevated mat table with RW, therapist having to help steady RW and provide assist for balance/stability as pt takes incr time to stand (pt with one hand on RW and one hand from mat table) - performed x4 reps total from mat. cues for wider BOS and proper foot placement prior to standing    Stand to Sit 4: Min assist;With upper extremity assist;To chair/3-in-1;4: Min guard    Stand to Sit Details cues to reach back for slowed descent, assist for controlled descent esp back to w/c as lower surface      Ambulation/Gait   Ambulation/Gait Yes    Ambulation/Gait Assistance 4: Min assist    Ambulation/Gait Assistance Details w/c follow for safety - with B anterior allard AFOs, pt only needing 1 therapist assisting with min A, cues for posture and needing assist for RW propulsion. performed gait at end of session    Ambulation Distance (Feet) 60 Feet   x1   Assistive device Rolling walker    Gait Pattern Step-to pattern;Step-through pattern;Decreased stride length;Decreased step length -  right;Decreased step length - left;Decreased dorsiflexion - right;Decreased dorsiflexion - left;Right steppage;Left steppage;Narrow base of support    Ambulation Surface Level;Indoor    Pre-Gait Activities standing in // bars with BUE support: mini squats 2 x 5 reps - cues for technique and posture/glute activation once standing, alternating UE lifts x5 reps B, alternating marching x10 reps B wth assist for weight shift. cues throughout for posture and therapist providing assist at B knees but no buckling noted      Therapeutic Activites    Therapeutic Activities Other Therapeutic Activities    Other Therapeutic Activities got pt scheduled for aquatic therapy and provided handout. pt and pt's family reports that pt has been walking on his own at home with RW and over unlevel surfaces (slid down from car the other day when  trying to get out to stand with RW). discussed that since pt does not have his B AFOs yet (there is a hold up with getting them from the [place]), worry about pt's safety ambulating at home due to B foot drop and still with weakness, fall risk and balance deficits and for right now would not be the safest to perform at home as would not want pt to have a fall with injury and have a set back, esp not ambulating outdoors over unlevel surfaces.                  PT Education - 01/25/21 1626    Education Details gave information for aquatic therapy (pt to start next week), see TA.    Person(s) Educated Patient;Spouse;Child(ren)    Methods Explanation;Handout    Comprehension Verbalized understanding            PT Short Term Goals - 01/05/21 0300      PT SHORT TERM GOAL #1   Title Pt will initiate aquatic therapy. ALL STGS DUE 02/16/21    Baseline not yet started.    Time 6   due to delay in scheduling   Period Weeks    Status New    Target Date 02/16/21      PT SHORT TERM GOAL #2   Title Pt will perform sit <> stand with RW from standard height mat table and w/c with min A in order to demo improved functional transfers.    Baseline min/mod A from higher mat table.    Time 6    Status New      PT SHORT TERM GOAL #3   Title Pt will tolerate standing at RW for 4 minutes with supervision in order to demo improved standing tolerance for ADLs.    Baseline 3:30 with min guard with RW    Time 6    Period Weeks    Status New      PT SHORT TERM GOAL #4   Title Pt will perform stand pivot transfer with min A with RW and B AFOs in order to demo improved functional transfers.    Baseline not yet assessed.    Time 6    Period Weeks    Status New      PT SHORT TERM GOAL #5   Title Pt will ambulate at least 230' with min guard with RW and B AFOs in order to demo improved household mobility.    Time 6    Period Weeks    Status New             PT Long Term Goals - 01/04/21 1635  PT LONG TERM GOAL #1   Title Pt and pt's caregiver will be independent with final HEP in order to build upon functional gains made in therapy. ALL LTGS DUE 03/16/21    Baseline reviewed and upgraded on 01/04/21 - will continue to benefit from updates    Time 10   due to delay in scheduling   Period Weeks    Status Achieved    Target Date 03/16/21      PT LONG TERM GOAL #2   Title Pt will perform stand pivot transfer with min guard with RW and B AFOs in order to demo improved functional transfers    Baseline --    Time 10    Period Weeks    Status New      PT LONG TERM GOAL #3   Title Pt will ambulate at least 300' with RW and B AFOs with supervision in order to demo improved household mobility.    Baseline --    Time 10    Period Weeks    Status New      PT LONG TERM GOAL #4   Title Pt will ambulate at least 100' over outdoor paved surfaces with min guard with RW.    Baseline --    Time 10    Period Weeks    Status New      PT LONG TERM GOAL #5   Title Pt will perform sit <> stand with RW from standard height mat table and w/c with min guard in order to demo improved functional transfers.    Baseline --    Time 10    Period Weeks    Status New                 Plan - 01/25/21 1636    Clinical Impression Statement Pt returns to PT session today after being on vacation and receiving auth [place]. Today's skilled session focused on standing tolerance, gait training, and BLE strengthening. Pt needing min A for balance and steadying RW from very elevated mat table, took incr time when performing sit > stand. Performed gait training today with single assist of one therapist with min A and w/c follow for safety. Will continue to progress towards LTGs.    Personal Factors and Comorbidities Comorbidity 3+;Past/Current Experience   pt very active prior to diagnosis   Comorbidities GBS, a fib, hx of covid.    Examination-Activity Limitations Bathing;Bed  Mobility;Caring for [employer];Reach Overhead;Locomotion Level;Dressing;Hygiene/Grooming;Transfers;Stand;Toileting    Examination-Participation Restrictions Cleaning;Community Activity;Yard Work;Meal Prep;Shop;Laundry;Driving;Occupation   exercise, cycling   Stability/Clinical Decision Making Evolving/Moderate complexity    Rehab Potential Good    PT Frequency 2x / week   1-2x a week   PT Duration --   7-8 weeks   PT Treatment/Interventions Manual techniques;Therapeutic exercise;Gait training;Neuromuscular re-education;Aquatic Therapy;Orthotic Fit/Training    PT Next Visit Plan sit <> stand training. gait with RW and B anterior allard toe off; continue with strengthening, standing balance with decreased UE support. SciFit for strengthening and aerobic activity.    PT Home Exercise Plan [email or web address]    Consulted and Agree with Plan of Care Patient;Family member/caregiver    Family Member Consulted pt's wife and daughter.           Patient will benefit from skilled therapeutic intervention in order to improve the following deficits and impairments:  Decreased activity tolerance,Decreased coordination,Decreased balance,Decreased endurance,Decreased range of motion,Decreased strength,Difficulty walking,Impaired sensation,Postural dysfunction  Visit Diagnosis: Difficulty in walking,  not elsewhere classified  Muscle weakness (generalized)  Abnormal posture  Other symptoms and signs involving the nervous system     Problem List Patient Active Problem List   Diagnosis Date Noted  . Acute on chronic respiratory failure with hypoxia (HCC)   . Guillain Barr syndrome (HCC)   . Paroxysmal atrial fibrillation (HCC)   . Autonomic dysfunction   . Severe sepsis (HCC)     Drake Leach, PT, DPT  01/25/2021, 4:39 PM  [facility] [address] [place], [place], [address] Phone: [phone]   Fax:  [phone]  Name: BREVYN RING MRN: [id number] Date of Birth: 18-Jun-1946

## 2021-01-27 ENCOUNTER — Ambulatory Visit: Payer: No Typology Code available for payment source | Admitting: Physical Therapy

## 2021-02-01 ENCOUNTER — Ambulatory Visit: Payer: No Typology Code available for payment source | Admitting: Physical Therapy

## 2021-02-02 ENCOUNTER — Ambulatory Visit: Payer: No Typology Code available for payment source | Admitting: Physical Therapy

## 2021-02-03 ENCOUNTER — Ambulatory Visit: Payer: No Typology Code available for payment source | Admitting: Physical Therapy

## 2021-02-05 ENCOUNTER — Other Ambulatory Visit: Payer: Self-pay

## 2021-02-05 ENCOUNTER — Ambulatory Visit: Payer: No Typology Code available for payment source | Admitting: Physical Therapy

## 2021-02-05 ENCOUNTER — Encounter: Payer: Self-pay | Admitting: Physical Therapy

## 2021-02-05 DIAGNOSIS — R293 Abnormal posture: Secondary | ICD-10-CM

## 2021-02-05 DIAGNOSIS — M6281 Muscle weakness (generalized): Secondary | ICD-10-CM

## 2021-02-05 DIAGNOSIS — R262 Difficulty in walking, not elsewhere classified: Secondary | ICD-10-CM

## 2021-02-05 DIAGNOSIS — R29818 Other symptoms and signs involving the nervous system: Secondary | ICD-10-CM

## 2021-02-07 NOTE — Therapy (Signed)
[facility] [address] [place], [place], [address] Phone: [phone]   Fax:  [phone]  Physical Therapy Treatment  Patient Details  Name: Joseph Mayo MRN: [id number] Date of Birth: 12-Sep-1945 Referring Provider (PT): Jacky Kindle A   Encounter Date: 02/05/2021    02/05/21 1240  PT Visits / Re-Eval  Visit Number 18  Number of Visits 31  Authorization  Authorization Type [facility]  Authorization Time Period 15 visits through 04/28/21  Authorization - Visit Number 2  Authorization - Number of Visits 15  PT Time Calculation  PT Start Time 1233  PT Stop Time 1315  PT Time Calculation (min) 42 min  PT - End of Session  Equipment Utilized During Treatment Gait belt  Activity Tolerance Patient tolerated treatment well  Behavior During Therapy WFL for tasks assessed/performed    Past Medical History:  Diagnosis Date   Acute on chronic respiratory failure with hypoxia (HCC)    Autonomic dysfunction    Guillain Barr syndrome (HCC)    Paroxysmal atrial fibrillation (HCC)    Severe sepsis (HCC)     History reviewed. No pertinent surgical history.  There were no vitals filed for this visit.    02/05/21 1239  Symptoms/Limitations  Subjective No new complaints. No new falls or pain to report.  Patient is accompained by: Family member  Pertinent History GBS, a fib, hx of covid  Limitations Standing;Walking;Sitting  How long can you stand comfortably? about 1-2 minutes at the countertop with daughter assisting.  Patient Stated Goals wants to get up and walk.  Pain Assessment  Currently in Pain? No/denies       02/05/21 1241  Transfers  Transfers Sit to Stand;Stand to Sit  Sit to Stand 4: Min assist;With upper extremity assist;From bed;From chair/3-in-1  Stand to Sit 4: Min guard;With upper extremity assist;To bed;To chair/3-in-1  Ambulation/Gait  Ambulation/Gait Yes  Ambulation/Gait Assistance 4:  Min guard;4: Min assist  Ambulation/Gait Assistance Details no w/c follow this session. cues on posture and step placement. continued with use of bil Toeoff braces with gait while waiting on pt to recieve his braces.  Ambulation Distance (Feet) 115 Feet (x2)  Assistive device Rolling walker  Gait Pattern Step-to pattern;Step-through pattern;Decreased stride length;Decreased step length - right;Decreased step length - left;Decreased dorsiflexion - right;Decreased dorsiflexion - left;Right steppage;Left steppage;Narrow base of support  Ambulation Surface Level;Indoor  Neuro Re-ed   Neuro Re-ed Details  for balance/NMR: standing at RW next to mat table: alternating UE raises, bil UE raises, upper trunk rotation for 10 reps each side with min guard to min assist for balance; then with arms at sides with EO head movemetns left<>right, up<>down for 10 reps each with min guard to min assist for balance. cues on posture, hip/knee extension and weight shifting to assist with maintaining balance.           PT Short Term Goals - 01/05/21 5277       PT SHORT TERM GOAL #1   Title Pt will initiate aquatic therapy. ALL STGS DUE 02/16/21    Baseline not yet started.    Time 6   due to delay in scheduling   Period Weeks    Status New    Target Date 02/16/21      PT SHORT TERM GOAL #2   Title Pt will perform sit <> stand with RW from standard height mat table and w/c with min A in order to demo improved functional transfers.  Baseline min/mod A from higher mat table.    Time 6    Status New      PT SHORT TERM GOAL #3   Title Pt will tolerate standing at RW for 4 minutes with supervision in order to demo improved standing tolerance for ADLs.    Baseline 3:30 with min guard with RW    Time 6    Period Weeks    Status New      PT SHORT TERM GOAL #4   Title Pt will perform stand pivot transfer with min A with RW and B AFOs in order to demo improved functional transfers.    Baseline not yet  assessed.    Time 6    Period Weeks    Status New      PT SHORT TERM GOAL #5   Title Pt will ambulate at least 230' with min guard with RW and B AFOs in order to demo improved household mobility.    Time 6    Period Weeks    Status New               PT Long Term Goals - 01/04/21 1635       PT LONG TERM GOAL #1   Title Pt and pt's caregiver will be independent with final HEP in order to build upon functional gains made in therapy. ALL LTGS DUE 03/16/21    Baseline reviewed and upgraded on 01/04/21 - will continue to benefit from updates    Time 10   due to delay in scheduling   Period Weeks    Status Achieved    Target Date 03/16/21      PT LONG TERM GOAL #2   Title Pt will perform stand pivot transfer with min guard with RW and B AFOs in order to demo improved functional transfers    Baseline --    Time 10    Period Weeks    Status New      PT LONG TERM GOAL #3   Title Pt will ambulate at least 300' with RW and B AFOs with supervision in order to demo improved household mobility.    Baseline --    Time 10    Period Weeks    Status New      PT LONG TERM GOAL #4   Title Pt will ambulate at least 100' over outdoor paved surfaces with min guard with RW.    Baseline --    Time 10    Period Weeks    Status New      PT LONG TERM GOAL #5   Title Pt will perform sit <> stand with RW from standard height mat table and w/c with min guard in order to demo improved functional transfers.    Baseline --    Time 10    Period Weeks    Status New              02/05/21 1240  Plan  Clinical Impression Statement Today's skilled session continued to focus on gait with RW/braces and working on standing with decreased UE support. No issues noted or reported in session. The pt is progressing toward goals and should benefit from continued PT to progress toward unmet goals.  Personal Factors and Comorbidities Comorbidity 3+;Past/Current Experience (pt very active prior to  diagnosis)  Comorbidities GBS, a fib, hx of covid.  Examination-Activity Limitations Bathing;Bed Mobility;Caring for [employer];Locomotion Level;Dressing;Hygiene/Grooming;Transfers;Stand;Toileting  Examination-Participation Restrictions Cleaning;Community Activity;Yard Work;Meal Prep;Shop;Laundry;Driving;Occupation (exercise, cycling)  Pt will benefit from skilled therapeutic intervention in order to improve on the following deficits Decreased activity tolerance;Decreased coordination;Decreased balance;Decreased endurance;Decreased range of motion;Decreased strength;Difficulty walking;Impaired sensation;Postural dysfunction  Stability/Clinical Decision Making Evolving/Moderate complexity  Rehab Potential Good  PT Frequency 2x / week (1-2x a week)  PT Duration  (7-8 weeks)  PT Treatment/Interventions Manual techniques;Therapeutic exercise;Gait training;Neuromuscular re-education;Aquatic Therapy;Orthotic Fit/Training  PT Next Visit Plan sit <> stand training. gait with RW and B anterior allard toe off; continue with strengthening, standing balance with decreased UE support. SciFit for strengthening and aerobic activity.  PT Home Exercise Plan [id number]  Consulted and Agree with Plan of Care Patient;Family member/caregiver  Family Member Consulted pt's wife and daughter.          Patient will benefit from skilled therapeutic intervention in order to improve the following deficits and impairments:  Decreased activity tolerance, Decreased coordination, Decreased balance, Decreased endurance, Decreased range of motion, Decreased strength, Difficulty walking, Impaired sensation, Postural dysfunction  Visit Diagnosis: Difficulty in walking, not elsewhere classified  Muscle weakness (generalized)  Abnormal posture  Other symptoms and signs involving the nervous system     Problem List Patient Active Problem List   Diagnosis Date Noted   Acute on chronic respiratory failure  with hypoxia (HCC)    Guillain Barr syndrome (HCC)    Paroxysmal atrial fibrillation (HCC)    Autonomic dysfunction    Severe sepsis (HCC)    Sallyanne Kuster, PTA, [facility] [address] [place], [place] [address] [phone] 02/07/21, 10:41 PM   Name: Joseph Mayo MRN: [id number] Date of Birth: [birth year]

## 2021-02-08 ENCOUNTER — Other Ambulatory Visit: Payer: Self-pay

## 2021-02-08 ENCOUNTER — Ambulatory Visit: Payer: No Typology Code available for payment source | Attending: Family Medicine | Admitting: Physical Therapy

## 2021-02-08 ENCOUNTER — Ambulatory Visit: Payer: No Typology Code available for payment source | Admitting: Physical Therapy

## 2021-02-08 DIAGNOSIS — R29818 Other symptoms and signs involving the nervous system: Secondary | ICD-10-CM | POA: Insufficient documentation

## 2021-02-08 DIAGNOSIS — R293 Abnormal posture: Secondary | ICD-10-CM | POA: Insufficient documentation

## 2021-02-08 DIAGNOSIS — R262 Difficulty in walking, not elsewhere classified: Secondary | ICD-10-CM | POA: Diagnosis not present

## 2021-02-08 DIAGNOSIS — M6281 Muscle weakness (generalized): Secondary | ICD-10-CM | POA: Insufficient documentation

## 2021-02-09 ENCOUNTER — Encounter: Payer: Self-pay | Admitting: Physical Therapy

## 2021-02-09 NOTE — Therapy (Addendum)
[facility] [address] [place], [place], [address] Phone: [phone]   Fax:  [phone]  Physical Therapy Treatment  Patient Details  Name: Joseph Mayo MRN: [id number] Date of Birth: 1945-09-16 Referring Provider (PT): Jacky Kindle A   Encounter Date: 02/08/2021   PT End of Session - 02/09/21 1524     Visit Number 19    Number of Visits 31    Authorization Type VA    Authorization Time Period 15 visits through 04/28/21    Authorization - Visit Number 3    Authorization - Number of Visits 15    PT Start Time 1330    PT Stop Time 1415    PT Time Calculation (min) 45 min    Equipment Utilized During Treatment Other (comment)   water walker, gait belt, pool noodle, waist floatation belt   Activity Tolerance Patient tolerated treatment well    Behavior During Therapy WFL for tasks assessed/performed             Past Medical History:  Diagnosis Date   Acute on chronic respiratory failure with hypoxia (HCC)    Autonomic dysfunction    Guillain Barr syndrome (HCC)    Paroxysmal atrial fibrillation (HCC)    Severe sepsis (HCC)     History reviewed. No pertinent surgical history.  There were no vitals filed for this visit.   Subjective Assessment - 02/09/21 1523     Subjective Pt denies any falls or changes.    Patient is accompained by: Family member    Pertinent History GBS, a fib, hx of covid    Limitations Standing;Walking;Sitting    How long can you stand comfortably? about 1-2 minutes at the countertop with daughter assisting.    Patient Stated Goals wants to get up and walk.    Currently in Pain? No/denies            Aquatic therapy at [facility]   Patient seen for aquatic therapy today.  Treatment took place in water 3.6-4.6 feet deep depending upon activity.  Pt entered the pool via step electric lift with +2 assist for safety.  Pt transferred w/c<>lift chair with set  up and supervision/CGA.     Performed runners stretch at pool edge x 30 sec each side.    Placed floatation belt on pt for safety.  Gait training with pt holding pool edge vs HHA of PTA and min assist with moderate verbal cues for foot positioning before stepping.  Pt initially with high steppage gait but did improve with cues despite not having AFO's. Performed gait training vertical and horizontal in pool.    Pt seated on pool bench for R LAQ and hip flexion x 15 reps x 2 sets AAROM.  Pt with difficulty maintaining seated position on bench due to buoyancy.  Sit<>stand from bench x 10 reps with min assist and cues.    Pt requires buoyancy of water for support for safety with gait training and with standing balance; viscosity of water needed for resistance for strengthening      PT Short Term Goals - 01/05/21 0918       PT SHORT TERM GOAL #1   Title Pt will initiate aquatic therapy. ALL STGS DUE 02/16/21    Baseline not yet started.    Time 6   due to delay in scheduling   Period Weeks    Status New    Target Date 02/16/21  PT SHORT TERM GOAL #2   Title Pt will perform sit <> stand with RW from standard height mat table and w/c with min A in order to demo improved functional transfers.    Baseline min/mod A from higher mat table.    Time 6    Status New      PT SHORT TERM GOAL #3   Title Pt will tolerate standing at RW for 4 minutes with supervision in order to demo improved standing tolerance for ADLs.    Baseline 3:30 with min guard with RW    Time 6    Period Weeks    Status New      PT SHORT TERM GOAL #4   Title Pt will perform stand pivot transfer with min A with RW and B AFOs in order to demo improved functional transfers.    Baseline not yet assessed.    Time 6    Period Weeks    Status New      PT SHORT TERM GOAL #5   Title Pt will ambulate at least 230' with min guard with RW and B AFOs in order to demo improved household mobility.    Time 6    Period Weeks     Status New               PT Long Term Goals - 01/04/21 1635       PT LONG TERM GOAL #1   Title Pt and pt's caregiver will be independent with final HEP in order to build upon functional gains made in therapy. ALL LTGS DUE 03/16/21    Baseline reviewed and upgraded on 01/04/21 - will continue to benefit from updates    Time 10   due to delay in scheduling   Period Weeks    Status Achieved    Target Date 03/16/21      PT LONG TERM GOAL #2   Title Pt will perform stand pivot transfer with min guard with RW and B AFOs in order to demo improved functional transfers    Baseline --    Time 10    Period Weeks    Status New      PT LONG TERM GOAL #3   Title Pt will ambulate at least 300' with RW and B AFOs with supervision in order to demo improved household mobility.    Baseline --    Time 10    Period Weeks    Status New      PT LONG TERM GOAL #4   Title Pt will ambulate at least 100' over outdoor paved surfaces with min guard with RW.    Baseline --    Time 10    Period Weeks    Status New      PT LONG TERM GOAL #5   Title Pt will perform sit <> stand with RW from standard height mat table and w/c with min guard in order to demo improved functional transfers.    Baseline --    Time 10    Period Weeks    Status New             Plan -       Clinical Impression Statement Pt's session focused on gait, balance and strengthening.  Pt continues to perform steppage gait in pool due to not having AFO's in pool.  Pt requires min-mod assist with aquatic activities.  Pt benefits from water buoyancy for decreased WB along with decreased fall  risk.  Cont per poc.    Personal Factors and Comorbidities Comorbidity 3+;Past/Current Experience   pt very active prior to diagnosis    Comorbidities GBS, a fib, hx of covid.    Examination-Activity Limitations Bathing;Bed Mobility;Caring for [employer];Locomotion Level;Dressing;Hygiene/Grooming;Transfers;Stand;Toileting     Examination-Participation Restrictions Cleaning;Community Activity;Yard Work;Meal Prep;Shop;Laundry;Driving;Occupation   exercise, cycling    Stability/Clinical Decision Making Evolving/Moderate complexity    Rehab Potential Good    PT Frequency 2x / week   1-2x a week    PT Duration --   7-8 weeks    PT Treatment/Interventions Manual techniques;Therapeutic exercise;Gait training;Neuromuscular re-education;Aquatic Therapy;Orthotic Fit/Training    PT Next Visit Plan sit <> stand training. gait with RW and B anterior allard toe off; continue with strengthening, standing balance with decreased UE support. SciFit for strengthening and aerobic activity.    PT Home Exercise Plan [id number]    Consulted and Agree with Plan of Care Patient;Family member/caregiver    Family Member Consulted pt's wife and daughter.                   Patient will benefit from skilled therapeutic intervention in order to improve the following deficits and impairments:  Decreased activity tolerance, Decreased coordination, Decreased balance, Decreased endurance, Decreased range of motion, Decreased strength, Difficulty walking, Impaired sensation, Postural dysfunction   Visit Diagnosis: Difficulty in walking, not elsewhere classified   Muscle weakness (generalized)   Abnormal posture   Other symptoms and signs involving the nervous system        Problem List Patient Active Problem List   Diagnosis Date Noted   Acute on chronic respiratory failure with hypoxia (HCC)    Guillain Barr syndrome (HCC)    Paroxysmal atrial fibrillation (HCC)    Autonomic dysfunction    Severe sepsis (HCC)    Newell Coral, PTA [facility] 02/09/21 4:28 PM Phone: [phone] Fax: [phone]  [facility] [address] [place], [place], [address] Phone: [phone]   Fax:  [phone]  Name: Joseph Mayo MRN:  [id number] Date of Birth: 1946-02-02

## 2021-02-10 ENCOUNTER — Other Ambulatory Visit: Payer: Self-pay

## 2021-02-10 ENCOUNTER — Encounter: Payer: Self-pay | Admitting: Physical Therapy

## 2021-02-10 ENCOUNTER — Ambulatory Visit: Payer: No Typology Code available for payment source | Admitting: Physical Therapy

## 2021-02-10 DIAGNOSIS — R293 Abnormal posture: Secondary | ICD-10-CM

## 2021-02-10 DIAGNOSIS — R262 Difficulty in walking, not elsewhere classified: Secondary | ICD-10-CM | POA: Diagnosis not present

## 2021-02-10 DIAGNOSIS — M6281 Muscle weakness (generalized): Secondary | ICD-10-CM

## 2021-02-11 NOTE — Therapy (Signed)
[facility] [address] [place], [place], [address] Phone: [phone]   Fax:  [phone]  Physical Therapy Treatment  Patient Details  Name: Joseph Mayo MRN: [id number] Date of Birth: Jan 01, 1946 Referring Provider (PT): Jacky Kindle A   Encounter Date: 02/10/2021    02/10/21 1412  PT Visits / Re-Eval  Visit Number 20  Number of Visits 31  Authorization  Authorization Type VA  Authorization Time Period 15 visits through 04/28/21  Authorization - Visit Number 4  Authorization - Number of Visits 15  PT Time Calculation  PT Start Time 1404  PT Stop Time 1445  PT Time Calculation (min) 41 min  PT - End of Session  Equipment Utilized During Treatment Gait belt  Activity Tolerance Patient tolerated treatment well  Behavior During Therapy WFL for tasks assessed/performed    Past Medical History:  Diagnosis Date   Acute on chronic respiratory failure with hypoxia (HCC)    Autonomic dysfunction    Guillain Barr syndrome (HCC)    Paroxysmal atrial fibrillation (HCC)    Severe sepsis (HCC)     History reviewed. No pertinent surgical history.  There were no vitals filed for this visit.    02/10/21 1411  Symptoms/Limitations  Subjective No new complaints. No falls or pain to report.  Patient is accompained by: Family member  Pertinent History GBS, a fib, hx of covid  Limitations Standing;Walking;Sitting  How long can you stand comfortably? about 1-2 minutes at the countertop with daughter assisting.  Patient Stated Goals wants to get up and walk.  Pain Assessment  Currently in Pain? No/denies      02/10/21 1412  Transfers  Transfers Sit to Stand;Stand to Sit  Sit to Stand 4: Min assist;With upper extremity assist;From bed;From chair/3-in-1  Stand to Sit 4: Min guard;With upper extremity assist;To bed;To chair/3-in-1  Ambulation/Gait  Ambulation/Gait Yes  Ambulation/Gait Assistance 4: Min  guard  Ambulation/Gait Assistance Details no w/c follow this session. cues on posture and step placement with bil anterior Toe off braces on. no buckling of LE's noted with gait.  Ambulation Distance (Feet) 115 Feet (x1, plus around clinic with session)  Assistive device Rolling walker (bil AFO's)  Gait Pattern Step-to pattern;Step-through pattern;Decreased stride length;Decreased step length - right;Decreased step length - left;Decreased dorsiflexion - right;Decreased dorsiflexion - left;Right steppage;Left steppage;Narrow base of support  Ambulation Surface Level;Indoor  High Level Balance  High Level Balance Activities Side stepping;Marching forwards;Backward walking  High Level Balance Comments in parallel bars with bil AFO's donned: side stepping left<>right for 3 laps each way with empahsis on posture and form with min guard assist, UE support on bars; forward marching/backward walking with UE support on bars, cues on form/technique with min guard assist for safety for 3 laps each/each way,       PT Short Term Goals - 01/05/21 3557       PT SHORT TERM GOAL #1   Title Pt will initiate aquatic therapy. ALL STGS DUE 02/16/21    Baseline not yet started.    Time 6   due to delay in scheduling   Period Weeks    Status New    Target Date 02/16/21      PT SHORT TERM GOAL #2   Title Pt will perform sit <> stand with RW from standard height mat table and w/c with min A in order to demo improved functional transfers.    Baseline min/mod A from higher mat table.  Time 6    Status New      PT SHORT TERM GOAL #3   Title Pt will tolerate standing at RW for 4 minutes with supervision in order to demo improved standing tolerance for ADLs.    Baseline 3:30 with min guard with RW    Time 6    Period Weeks    Status New      PT SHORT TERM GOAL #4   Title Pt will perform stand pivot transfer with min A with RW and B AFOs in order to demo improved functional transfers.    Baseline not yet  assessed.    Time 6    Period Weeks    Status New      PT SHORT TERM GOAL #5   Title Pt will ambulate at least 230' with min guard with RW and B AFOs in order to demo improved household mobility.    Time 6    Period Weeks    Status New               PT Long Term Goals - 01/04/21 1635       PT LONG TERM GOAL #1   Title Pt and pt's caregiver will be independent with final HEP in order to build upon functional gains made in therapy. ALL LTGS DUE 03/16/21    Baseline reviewed and upgraded on 01/04/21 - will continue to benefit from updates    Time 10   due to delay in scheduling   Period Weeks    Status Achieved    Target Date 03/16/21      PT LONG TERM GOAL #2   Title Pt will perform stand pivot transfer with min guard with RW and B AFOs in order to demo improved functional transfers    Baseline --    Time 10    Period Weeks    Status New      PT LONG TERM GOAL #3   Title Pt will ambulate at least 300' with RW and B AFOs with supervision in order to demo improved household mobility.    Baseline --    Time 10    Period Weeks    Status New      PT LONG TERM GOAL #4   Title Pt will ambulate at least 100' over outdoor paved surfaces with min guard with RW.    Baseline --    Time 10    Period Weeks    Status New      PT LONG TERM GOAL #5   Title Pt will perform sit <> stand with RW from standard height mat table and w/c with min guard in order to demo improved functional transfers.    Baseline --    Time 10    Period Weeks    Status New               02/10/21 1412  Plan  Clinical Impression Statement Today's skilled session continued to focus on strengthening, balance in standing and gait with RW/braces with no issues noted or reported in session. The pt is making steady progress toward goals with increased tolerance to standing actiivites. The pt should benefit from contined PT to progress toward unmet goals.  Personal Factors and Comorbidities Comorbidity  3+;Past/Current Experience (pt very active prior to diagnosis)  Comorbidities GBS, a fib, hx of covid.  Examination-Activity Limitations Bathing;Bed Mobility;Caring for [employer];Locomotion Level;Dressing;Hygiene/Grooming;Transfers;Stand;Toileting  Examination-Participation Restrictions Cleaning;Community Activity;Yard Work;Meal Prep;Shop;Laundry;Driving;Occupation (exercise, cycling)  Pt  will benefit from skilled therapeutic intervention in order to improve on the following deficits Decreased activity tolerance;Decreased coordination;Decreased balance;Decreased endurance;Decreased range of motion;Decreased strength;Difficulty walking;Impaired sensation;Postural dysfunction  Stability/Clinical Decision Making Evolving/Moderate complexity  Rehab Potential Good  PT Frequency 2x / week (1-2x a week)  PT Duration  (7-8 weeks)  PT Treatment/Interventions Manual techniques;Therapeutic exercise;Gait training;Neuromuscular re-education;Aquatic Therapy;Orthotic Fit/Training  PT Next Visit Plan sit <> stand training. gait with RW and B anterior allard toe off; continue with strengthening, standing balance with decreased UE support. SciFit for strengthening and aerobic activity.  PT Home Exercise Plan [id number]  Consulted and Agree with Plan of Care Patient;Family member/caregiver  Family Member Consulted pt's wife and daughter.         Patient will benefit from skilled therapeutic intervention in order to improve the following deficits and impairments:  Decreased activity tolerance, Decreased coordination, Decreased balance, Decreased endurance, Decreased range of motion, Decreased strength, Difficulty walking, Impaired sensation, Postural dysfunction  Visit Diagnosis: Difficulty in walking, not elsewhere classified  Muscle weakness (generalized)  Abnormal posture     Problem List Patient Active Problem List   Diagnosis Date Noted   Acute on chronic respiratory failure with  hypoxia (HCC)    Guillain Barr syndrome (HCC)    Paroxysmal atrial fibrillation (HCC)    Autonomic dysfunction    Severe sepsis (HCC)    Sallyanne Kuster, PTA, [facility] [address], [address] [place], [place] [address] [phone] 02/11/21, 11:46 PM   Name: ZAKHI DUPRE MRN: [id number] Date of Birth: 1946/03/18

## 2021-02-15 ENCOUNTER — Ambulatory Visit: Payer: No Typology Code available for payment source | Admitting: Physical Therapy

## 2021-02-15 ENCOUNTER — Other Ambulatory Visit: Payer: Self-pay

## 2021-02-15 DIAGNOSIS — R29818 Other symptoms and signs involving the nervous system: Secondary | ICD-10-CM

## 2021-02-15 DIAGNOSIS — R293 Abnormal posture: Secondary | ICD-10-CM

## 2021-02-15 DIAGNOSIS — R262 Difficulty in walking, not elsewhere classified: Secondary | ICD-10-CM

## 2021-02-15 DIAGNOSIS — M6281 Muscle weakness (generalized): Secondary | ICD-10-CM

## 2021-02-16 NOTE — Therapy (Signed)
[facility] [address] [place], [place], [address] Phone: [phone]   Fax:  [phone]  Physical Therapy Treatment  Patient Details  Name: Joseph Mayo MRN: [id number] Date of Birth: May 14, 1946 Referring Provider (PT): Jacky Kindle A   Encounter Date: 02/15/2021   PT End of Session - 02/16/21 1451     Visit Number 21    Number of Visits 31    Authorization Type [facility]    Authorization Time Period 15 visits through 04/28/21    Authorization - Visit Number 5    Authorization - Number of Visits 15    PT Start Time 1330    PT Stop Time 1410    PT Time Calculation (min) 40 min    Equipment Utilized During Treatment Other (comment)   floatation belt, neck noodle, pool noodle   Activity Tolerance Patient tolerated treatment well    Behavior During Therapy WFL for tasks assessed/performed             Past Medical History:  Diagnosis Date   Acute on chronic respiratory failure with hypoxia (HCC)    Autonomic dysfunction    Guillain Barr syndrome (HCC)    Paroxysmal atrial fibrillation (HCC)    Severe sepsis (HCC)     No past surgical history on file.  There were no vitals filed for this visit.   Subjective Assessment - 02/16/21 1450     Subjective Denies any falls or changes.  Had a busy weekend.    Patient is accompained by: Family member    Pertinent History GBS, a fib, hx of covid    Limitations Standing;Walking;Sitting    How long can you stand comfortably? about 1-2 minutes at the countertop with daughter assisting.    Patient Stated Goals wants to get up and walk.    Currently in Pain? No/denies            Aquatic therapy at [facility]   Patient seen for aquatic therapy today.  Treatment took place in water 3.6-4.6 feet deep depending upon activity.  Pt entered the pool via step electric lift with +2 assist for safety.  Pt transferred w/c<>lift chair with set up and  supervision/CGA.     Performed runners stretch at pool edge x 30 sec each side.     Placed floatation belt on pt for safety.  Gait training with pt holding pool edge vs HHA of PTA and min assist with moderate verbal cues for foot positioning before stepping.  Pt initially with high steppage gait but did improve with cues despite not having AFO's. Performed gait training vertical and horizontal in pool.    Pt seated on pool bench for R LAQ and hip flexion x 15 reps x 2 sets AAROM.  Pt with difficulty maintaining seated position on bench due to buoyancy.  Sit<>stand from bench x 10 reps with min assist and cues.  In supine position with water floatation belt, pool noodle, neck noodle and PTA assisting to support pt.  Performed LE bicycling and hip abd/add.    Pt requires buoyancy of water for support for safety with gait training and with standing balance; viscosity of water needed for resistance for strengthening         PT Short Term Goals - 01/05/21 0918       PT SHORT TERM GOAL #1   Title Pt will initiate aquatic therapy. ALL STGS DUE 02/16/21    Baseline not yet started.  Time 6   due to delay in scheduling   Period Weeks    Status New    Target Date 02/16/21      PT SHORT TERM GOAL #2   Title Pt will perform sit <> stand with RW from standard height mat table and w/c with min A in order to demo improved functional transfers.    Baseline min/mod A from higher mat table.    Time 6    Status New      PT SHORT TERM GOAL #3   Title Pt will tolerate standing at RW for 4 minutes with supervision in order to demo improved standing tolerance for ADLs.    Baseline 3:30 with min guard with RW    Time 6    Period Weeks    Status New      PT SHORT TERM GOAL #4   Title Pt will perform stand pivot transfer with min A with RW and B AFOs in order to demo improved functional transfers.    Baseline not yet assessed.    Time 6    Period Weeks    Status New      PT SHORT TERM GOAL #5    Title Pt will ambulate at least 230' with min guard with RW and B AFOs in order to demo improved household mobility.    Time 6    Period Weeks    Status New               PT Long Term Goals - 01/04/21 1635       PT LONG TERM GOAL #1   Title Pt and pt's caregiver will be independent with final HEP in order to build upon functional gains made in therapy. ALL LTGS DUE 03/16/21    Baseline reviewed and upgraded on 01/04/21 - will continue to benefit from updates    Time 10   due to delay in scheduling   Period Weeks    Status Achieved    Target Date 03/16/21      PT LONG TERM GOAL #2   Title Pt will perform stand pivot transfer with min guard with RW and B AFOs in order to demo improved functional transfers    Baseline --    Time 10    Period Weeks    Status New      PT LONG TERM GOAL #3   Title Pt will ambulate at least 300' with RW and B AFOs with supervision in order to demo improved household mobility.    Baseline --    Time 10    Period Weeks    Status New      PT LONG TERM GOAL #4   Title Pt will ambulate at least 100' over outdoor paved surfaces with min guard with RW.    Baseline --    Time 10    Period Weeks    Status New      PT LONG TERM GOAL #5   Title Pt will perform sit <> stand with RW from standard height mat table and w/c with min guard in order to demo improved functional transfers.    Baseline --    Time 10    Period Weeks    Status New                   Plan - 02/16/21 1452     Clinical Impression Statement Pt's session focused on gait, balance and strengthening.  Pt  continues to perform steppage gait in pool due to not having AFO's in pool.  Pt requires min-mod assist with aquatic activities.  Pt benefits from water buoyancy for decreased WB along with decreased fall risk.  Cont per poc.    Personal Factors and Comorbidities Comorbidity 3+;Past/Current Experience   pt very active prior to diagnosis   Comorbidities GBS, a fib, hx of  covid.    Examination-Activity Limitations Bathing;Bed Mobility;Caring for [employer];Locomotion Level;Dressing;Hygiene/Grooming;Transfers;Stand;Toileting    Examination-Participation Restrictions Cleaning;Community Activity;Yard Work;Meal Prep;Shop;Laundry;Driving;Occupation   exercise, cycling   Stability/Clinical Decision Making Evolving/Moderate complexity    Rehab Potential Good    PT Frequency 2x / week   1-2x a week   PT Duration --   7-8 weeks   PT Treatment/Interventions Manual techniques;Therapeutic exercise;Gait training;Neuromuscular re-education;Aquatic Therapy;Orthotic Fit/Training    PT Next Visit Plan sit <> stand training. gait with RW and B anterior allard toe off; continue with strengthening, standing balance with decreased UE support. SciFit for strengthening and aerobic activity.    PT Home Exercise Plan [id number]    Consulted and Agree with Plan of Care Patient;Family member/caregiver    Family Member Consulted pt's wife and daughter.             Patient will benefit from skilled therapeutic intervention in order to improve the following deficits and impairments:  Decreased activity tolerance, Decreased coordination, Decreased balance, Decreased endurance, Decreased range of motion, Decreased strength, Difficulty walking, Impaired sensation, Postural dysfunction  Visit Diagnosis: Difficulty in walking, not elsewhere classified  Muscle weakness (generalized)  Abnormal posture  Other symptoms and signs involving the nervous system     Problem List Patient Active Problem List   Diagnosis Date Noted   Acute on chronic respiratory failure with hypoxia (HCC)    Guillain Barr syndrome (HCC)    Paroxysmal atrial fibrillation (HCC)    Autonomic dysfunction    Severe sepsis (HCC)    Newell Coral, PTA [facility] 02/16/21 2:58 PM Phone: [phone] Fax: [phone]   [facility]  [facility] [address] [place], [place], [address] Phone: [phone]   Fax:  [phone]  Name: LILIANA BRENTLINGER MRN: [id number] Date of Birth: [birth year]

## 2021-02-17 ENCOUNTER — Encounter: Payer: Self-pay | Admitting: Physical Therapy

## 2021-02-17 ENCOUNTER — Other Ambulatory Visit: Payer: Self-pay

## 2021-02-17 ENCOUNTER — Ambulatory Visit: Payer: No Typology Code available for payment source | Admitting: Physical Therapy

## 2021-02-17 ENCOUNTER — Ambulatory Visit: Payer: No Typology Code available for payment source | Admitting: Occupational Therapy

## 2021-02-17 DIAGNOSIS — R29818 Other symptoms and signs involving the nervous system: Secondary | ICD-10-CM

## 2021-02-17 DIAGNOSIS — R293 Abnormal posture: Secondary | ICD-10-CM

## 2021-02-17 DIAGNOSIS — R262 Difficulty in walking, not elsewhere classified: Secondary | ICD-10-CM

## 2021-02-17 DIAGNOSIS — M6281 Muscle weakness (generalized): Secondary | ICD-10-CM

## 2021-02-17 NOTE — Therapy (Signed)
[facility] [address] [place], [place], [address] Phone: [phone]   Fax:  [phone]  Physical Therapy Treatment  Patient Details  Name: Joseph Mayo MRN: [id number] Date of Birth: Aug 16, 1946 Referring Provider (PT): Greer Pickerel A   Encounter Date: 02/17/2021   PT End of Session - 02/17/21 1600     Visit Number 22    Number of Visits 31    Authorization Type [facility]    Authorization Time Period 15 visits through 04/28/21    Authorization - Visit Number 6    Authorization - Number of Visits 15    PT Start Time 1975    PT Stop Time 1445    PT Time Calculation (min) 42 min    Equipment Utilized During Treatment Other (comment);Gait belt    Activity Tolerance Patient tolerated treatment well    Behavior During Therapy WFL for tasks assessed/performed             Past Medical History:  Diagnosis Date   Acute on chronic respiratory failure with hypoxia (HCC)    Autonomic dysfunction    Guillain Barr syndrome ([place])    Paroxysmal atrial fibrillation ([place])    Severe sepsis ([place])     History reviewed. No pertinent surgical history.  There were no vitals filed for this visit.   Subjective Assessment - 02/17/21 1405     Subjective The pool is quite the workout. [employer] is still waiting for the paperwork from the [place].    Patient is accompained by: Family member    Pertinent History GBS, a fib, hx of covid    Limitations Standing;Walking;Sitting    How long can you stand comfortably? about 1-2 minutes at the countertop with daughter assisting.    Patient Stated Goals wants to get up and walk.    Currently in Pain? No/denies                               [facility] Adult PT Treatment/Exercise - 02/17/21 1418       Transfers   Transfers Sit to Stand;Stand to [employer] Transfers    Sit to Stand 4: Min guard;4: Min assist    Stand to Sit 4: Min guard;With upper extremity  assist;To bed;To chair/3-in-1    Stand Pivot Transfers 4: Min guard   with RW from bed > mat table   Comments from w/c and from mat table throughout session, approx. 5-6 reps total      Ambulation/Gait   Ambulation/Gait Yes    Ambulation/Gait Assistance 4: Min guard    Ambulation/Gait Assistance Details w/c follow during 2nd bout but not needed, performed with bilateral anterior toe off braces on, intermittent cues for posture and step length    Ambulation Distance (Feet) 115 Feet   x1, 230 x 1   Assistive device Rolling walker   bilateral AFOs   Gait Pattern Step-to pattern;Step-through pattern;Decreased stride length;Decreased step length - right;Decreased step length - left;Decreased dorsiflexion - right;Decreased dorsiflexion - left;Right steppage;Left steppage;Narrow base of support    Ambulation Surface Level;Indoor      Neuro Re-ed    Neuro Re-ed Details  standing with RW at edge of mat table: balance with no UE support 3 x 10 seconds with almost  pt losing balance posteriorly with min guard/min A, x10 reps head turns with fingertip support, alternating UE lifts x5 reps each side with fingertip support for balance.  alternating foot taps to 4" step x8 reps B with BUE support - cues for quad activation when performing                    PT Education - 02/17/21 1559     Education Details progress towards goals    Person(s) Educated Patient;Spouse    Methods Explanation    Comprehension Verbalized understanding              PT Short Term Goals - 02/17/21 1429       PT SHORT TERM GOAL #1   Title Pt will initiate aquatic therapy. ALL STGS DUE 02/16/21    Baseline met    Time 6   due to delay in scheduling   Period Weeks    Status Achieved    Target Date 02/16/21      PT SHORT TERM GOAL #2   Title Pt will perform sit <> stand with RW from standard height mat table and w/c with min A in order to demo improved functional transfers.    Baseline min A from standard  height mat table.    Time 6    Status Achieved      PT SHORT TERM GOAL #3   Title Pt will tolerate standing at RW for 4 minutes with supervision in order to demo improved standing tolerance for ADLs.    Baseline 3:30 with min guard with RW    Time 6    Period Weeks    Status New      PT SHORT TERM GOAL #4   Title Pt will perform stand pivot transfer with min A with RW and B AFOs in order to demo improved functional transfers.    Baseline min guard on 02/17/21    Time 6    Period Weeks    Status Achieved      PT SHORT TERM GOAL #5   Title Pt will ambulate at least 230' with min guard with RW and B AFOs in order to demo improved household mobility.    Baseline met on 02/17/21    Time 6    Period Weeks    Status Achieved                PT Long Term Goals - 01/04/21 1635       PT LONG TERM GOAL #1   Title Pt and pt's caregiver will be independent with final HEP in order to build upon functional gains made in therapy. ALL LTGS DUE 03/16/21    Baseline reviewed and upgraded on 01/04/21 - will continue to benefit from updates    Time 10   due to delay in scheduling   Period Weeks    Status Achieved    Target Date 03/16/21      PT LONG TERM GOAL #2   Title Pt will perform stand pivot transfer with min guard with RW and B AFOs in order to demo improved functional transfers    Baseline --    Time 10    Period Weeks    Status New      PT LONG TERM GOAL #3   Title Pt will ambulate at least 300' with RW and B AFOs with supervision in order to demo improved household mobility.    Baseline --    Time 10    Period Weeks    Status New      PT LONG TERM GOAL #4   Title Pt will  ambulate at least 100' over outdoor paved surfaces with min guard with RW.    Baseline --    Time 10    Period Weeks    Status New      PT LONG TERM GOAL #5   Title Pt will perform sit <> stand with RW from standard height mat table and w/c with min guard in order to demo improved functional  transfers.    Baseline --    Time 10    Period Weeks    Status New                   Plan - 02/17/21 1609     Clinical Impression Statement Began to assess pt's STGs today with pt meeting 4 out of 5 LTGs in regards to gait with RW, stand pivot transfers, and sit <> stands. From lower mat table, pt only needing min A to stand with RW. Pt able to ambulate a total of 345' today (1 bout of 115' and 1 bout of 230') with RW and min guard. Remainder of session focused on standing tolerance and balance with RW support. Pt is making excellent progress, will continue to progress towards LTGs.    Personal Factors and Comorbidities Comorbidity 3+;Past/Current Experience   pt very active prior to diagnosis   Comorbidities GBS, a fib, hx of covid.    Examination-Activity Limitations Bathing;Bed Mobility;Caring for [employer];Locomotion Level;Dressing;Hygiene/Grooming;Transfers;Stand;Toileting    Examination-Participation Restrictions Cleaning;Community Activity;Yard Work;Meal Prep;Shop;Laundry;Driving;Occupation   exercise, cycling   Stability/Clinical Decision Making Evolving/Moderate complexity    Rehab Potential Good    PT Frequency 2x / week   1-2x a week   PT Duration --   7-8 weeks   PT Treatment/Interventions Manual techniques;Therapeutic exercise;Gait training;Neuromuscular re-education;Aquatic Therapy;Orthotic Fit/Training    PT Next Visit Plan check last STG. sit <> stand training. gait with RW and B anterior allard toe off; continue with strengthening, standing balance with decreased UE support. SciFit for strengthening and aerobic activity.    PT Home Exercise Plan [id number]    Consulted and Agree with Plan of Care Patient;Family member/caregiver    Family Member Consulted pt's wife and daughter.             Patient will benefit from skilled therapeutic intervention in order to improve the following deficits and impairments:  Decreased activity tolerance, Decreased  coordination, Decreased balance, Decreased endurance, Decreased range of motion, Decreased strength, Difficulty walking, Impaired sensation, Postural dysfunction  Visit Diagnosis: Difficulty in walking, not elsewhere classified  Abnormal posture  Muscle weakness (generalized)  Other symptoms and signs involving the nervous system     Problem List Patient Active Problem List   Diagnosis Date Noted   Acute on chronic respiratory failure with hypoxia (HCC)    Guillain Barr syndrome ([place])    Paroxysmal atrial fibrillation ([place])    Autonomic dysfunction    Severe sepsis ([place])     Arliss Journey, PT ,DPT  02/17/2021, 4:09 PM  [facility] [address] [place], [place], [address] Phone: [phone]   Fax:  [phone]  Name: Joseph Mayo MRN: [id number] Date of Birth: 05/08/1946

## 2021-02-18 ENCOUNTER — Telehealth: Payer: Self-pay | Admitting: Physical Therapy

## 2021-02-18 NOTE — Telephone Encounter (Signed)
[facility] AFO [facility]. Representative stated that the order was faxed over to [employer] on 02/03/21. PT then called [employer] and representative stated that they have not gotten any order for this patient. [facility] asking to make sure that is was faxed to the right number - waiting to hear back regarding this information.  Sherlie Ban, PT, DPT 02/18/21 11:40 AM

## 2021-02-23 ENCOUNTER — Encounter: Payer: Self-pay | Admitting: Physical Therapy

## 2021-02-23 ENCOUNTER — Ambulatory Visit: Payer: No Typology Code available for payment source | Attending: Family Medicine | Admitting: Physical Therapy

## 2021-02-23 ENCOUNTER — Other Ambulatory Visit: Payer: Self-pay

## 2021-02-23 ENCOUNTER — Ambulatory Visit: Payer: No Typology Code available for payment source | Admitting: Physical Therapy

## 2021-02-23 DIAGNOSIS — R208 Other disturbances of skin sensation: Secondary | ICD-10-CM | POA: Insufficient documentation

## 2021-02-23 DIAGNOSIS — R29818 Other symptoms and signs involving the nervous system: Secondary | ICD-10-CM | POA: Diagnosis present

## 2021-02-23 DIAGNOSIS — M6281 Muscle weakness (generalized): Secondary | ICD-10-CM | POA: Diagnosis present

## 2021-02-23 DIAGNOSIS — R293 Abnormal posture: Secondary | ICD-10-CM | POA: Diagnosis present

## 2021-02-23 DIAGNOSIS — R262 Difficulty in walking, not elsewhere classified: Secondary | ICD-10-CM | POA: Diagnosis not present

## 2021-02-23 NOTE — Therapy (Signed)
[facility] [address] [place], [place], [address] Phone: [phone]   Fax:  [phone]  Physical Therapy Treatment  Patient Details  Name: Joseph Mayo MRN: [id number] Date of Birth: July 16, 1946 Referring Provider (PT): Jacky Kindle A   Encounter Date: 02/23/2021   PT End of Session - 02/23/21 1449     Visit Number 23    Number of Visits 31    Authorization Type [facility]    Authorization Time Period 15 visits through 04/28/21    Authorization - Visit Number 7    Authorization - Number of Visits 15    PT Start Time 1100    PT Stop Time 1145    PT Time Calculation (min) 45 min    Equipment Utilized During Treatment Gait belt    Activity Tolerance Patient tolerated treatment well    Behavior During Therapy WFL for tasks assessed/performed             Past Medical History:  Diagnosis Date   Acute on chronic respiratory failure with hypoxia (HCC)    Autonomic dysfunction    Guillain Barr syndrome (HCC)    Paroxysmal atrial fibrillation (HCC)    Severe sepsis (HCC)     History reviewed. No pertinent surgical history.  There were no vitals filed for this visit.   Subjective Assessment - 02/23/21 1449     Subjective Denies any changes.    Patient is accompained by: Family member    Pertinent History GBS, a fib, hx of covid    Limitations Standing;Walking;Sitting    How long can you stand comfortably? about 1-2 minutes at the countertop with daughter assisting.    Patient Stated Goals wants to get up and walk.    Currently in Pain? No/denies            Aquatic therapy at [facility]   Patient seen for aquatic therapy today.  Treatment took place in water 3.6-4.6 feet deep depending upon activity.  Pt entered the pool via step electric lift with +2 assist for safety.  Pt transferred w/c<>lift chair with set up and supervision/CGA.     Performed runners stretch at pool edge x 30 sec  each side.     Placed gait belt on pt for safety.  Gait training with pt holding pool edge vs HHA of PTA and min assist with moderate verbal cues for foot positioning before stepping.  Pt initially with high steppage gait but did improve with cues despite not having AFO's. Performed gait training vertical and horizontal in pool. Performed side stepping in 4.0 ft.   Pt seated on pool bench for bil PROM into ankle dorsiflexion x 30 sec x 2 reps.  Sit<>stand from bench x 10 reps with min assist and cues.   Standing at pool edge with bil UE support for bil hip flexion, hip extension, hip abd x 15 reps each.      Pt requires buoyancy of water for support for safety with gait training and with standing balance; viscosity of water needed for resistance for strengthening       PT Short Term Goals - 02/17/21 1429       PT SHORT TERM GOAL #1   Title Pt will initiate aquatic therapy. ALL STGS DUE 02/16/21    Baseline met    Time 6   due to delay in scheduling   Period Weeks    Status Achieved    Target Date 02/16/21  PT SHORT TERM GOAL #2   Title Pt will perform sit <> stand with RW from standard height mat table and w/c with min A in order to demo improved functional transfers.    Baseline min A from standard height mat table.    Time 6    Status Achieved      PT SHORT TERM GOAL #3   Title Pt will tolerate standing at RW for 4 minutes with supervision in order to demo improved standing tolerance for ADLs.    Baseline 3:30 with min guard with RW    Time 6    Period Weeks    Status New      PT SHORT TERM GOAL #4   Title Pt will perform stand pivot transfer with min A with RW and B AFOs in order to demo improved functional transfers.    Baseline min guard on 02/17/21    Time 6    Period Weeks    Status Achieved      PT SHORT TERM GOAL #5   Title Pt will ambulate at least 230' with min guard with RW and B AFOs in order to demo improved household mobility.    Baseline met on 02/17/21     Time 6    Period Weeks    Status Achieved               PT Long Term Goals - 01/04/21 1635       PT LONG TERM GOAL #1   Title Pt and pt's caregiver will be independent with final HEP in order to build upon functional gains made in therapy. ALL LTGS DUE 03/16/21    Baseline reviewed and upgraded on 01/04/21 - will continue to benefit from updates    Time 10   due to delay in scheduling   Period Weeks    Status Achieved    Target Date 03/16/21      PT LONG TERM GOAL #2   Title Pt will perform stand pivot transfer with min guard with RW and B AFOs in order to demo improved functional transfers    Baseline --    Time 10    Period Weeks    Status New      PT LONG TERM GOAL #3   Title Pt will ambulate at least 300' with RW and B AFOs with supervision in order to demo improved household mobility.    Baseline --    Time 10    Period Weeks    Status New      PT LONG TERM GOAL #4   Title Pt will ambulate at least 100' over outdoor paved surfaces with min guard with RW.    Baseline --    Time 10    Period Weeks    Status New      PT LONG TERM GOAL #5   Title Pt will perform sit <> stand with RW from standard height mat table and w/c with min guard in order to demo improved functional transfers.    Baseline --    Time 10    Period Weeks    Status New                   Plan - 02/23/21 1451     Clinical Impression Statement Aquatic session focused on strength, gait, balance.  Pt continues to require UE support with activites in pool.  Reports tightness in bil heel cords that improved with stretching and  increased activity.  Pt continues with bil foot drop and steppage gait.  Cont per poc.    Personal Factors and Comorbidities Comorbidity 3+;Past/Current Experience   pt very active prior to diagnosis   Comorbidities GBS, a fib, hx of covid.    Examination-Activity Limitations Bathing;Bed Mobility;Caring for [employer];Locomotion  Level;Dressing;Hygiene/Grooming;Transfers;Stand;Toileting    Examination-Participation Restrictions Cleaning;Community Activity;Yard Work;Meal Prep;Shop;Laundry;Driving;Occupation   exercise, cycling   Stability/Clinical Decision Making Evolving/Moderate complexity    Rehab Potential Good    PT Frequency 2x / week   1-2x a week   PT Duration --   7-8 weeks   PT Treatment/Interventions Manual techniques;Therapeutic exercise;Gait training;Neuromuscular re-education;Aquatic Therapy;Orthotic Fit/Training    PT Next Visit Plan check last STG. sit <> stand training. gait with RW and B anterior allard toe off; continue with strengthening, standing balance with decreased UE support. SciFit for strengthening and aerobic activity.    PT Home Exercise Plan [id number]    Consulted and Agree with Plan of Care Patient;Family member/caregiver    Family Member Consulted pt's wife and daughter.             Patient will benefit from skilled therapeutic intervention in order to improve the following deficits and impairments:  Decreased activity tolerance, Decreased coordination, Decreased balance, Decreased endurance, Decreased range of motion, Decreased strength, Difficulty walking, Impaired sensation, Postural dysfunction  Visit Diagnosis: Difficulty in walking, not elsewhere classified  Abnormal posture  Muscle weakness (generalized)  Other symptoms and signs involving the nervous system     Problem List Patient Active Problem List   Diagnosis Date Noted   Acute on chronic respiratory failure with hypoxia (HCC)    Guillain Barr syndrome ([place])    Paroxysmal atrial fibrillation ([place])    Autonomic dysfunction    Severe sepsis ([place])    Joseph Mayo, PTA [place] 02/23/21 2:57 PM Phone: [phone] Fax: [facility] [address] [place], [place], [address] Phone:  [phone]   Fax:  [phone]  Name: Joseph Mayo MRN: [id number] Date of Birth: 1946/02/09

## 2021-02-24 ENCOUNTER — Ambulatory Visit: Payer: No Typology Code available for payment source | Admitting: Physical Therapy

## 2021-02-24 ENCOUNTER — Encounter: Payer: Self-pay | Admitting: Physical Therapy

## 2021-02-24 ENCOUNTER — Telehealth: Payer: Self-pay | Admitting: Physical Therapy

## 2021-02-24 DIAGNOSIS — M6281 Muscle weakness (generalized): Secondary | ICD-10-CM

## 2021-02-24 DIAGNOSIS — R262 Difficulty in walking, not elsewhere classified: Secondary | ICD-10-CM

## 2021-02-24 DIAGNOSIS — R293 Abnormal posture: Secondary | ICD-10-CM

## 2021-02-24 DIAGNOSIS — R29818 Other symptoms and signs involving the nervous system: Secondary | ICD-10-CM

## 2021-02-24 NOTE — Telephone Encounter (Signed)
PT called the [facility] again after not receiving a call back last week about the status of patient's B AFOs. Was able to speak to a representative from the prosthetics/orthotics department at the [place] who said that the pt's order was sent to [facility] in [place] mid June. Representative from [place] stated he will reach out to [facility] to make sure that pt gets his AFOs ASAP.  PT called both [facility] clinics located in [place] - representatives from both clinics stated there was still no order found for Leda Quail for B AFOs.   Sherlie Ban, PT, DPT 02/24/21 9:57 AM

## 2021-02-28 NOTE — Therapy (Signed)
[facility] [address] [place], [place], [address] Phone: [phone]   Fax:  [phone]  Physical Therapy Treatment  Patient Details  Name: ROCIO Mayo MRN: [id number] Date of Birth: [birth year] Referring Provider (PT): Greer Pickerel A   Encounter Date: 02/24/2021   02/24/21 1406  PT Visits / Re-Eval  Visit Number 24  Number of Visits 31  Authorization  Authorization Type VA  Authorization Time Period 15 visits through 04/28/21  Authorization - Visit Number 8  Authorization - Number of Visits 15  PT Time Calculation  PT Start Time 1402  PT Stop Time 1445  PT Time Calculation (min) 43 min  PT - End of Session  Equipment Utilized During Treatment Gait belt  Activity Tolerance Patient tolerated treatment well  Behavior During Therapy WFL for tasks assessed/performed     Past Medical History:  Diagnosis Date   Acute on chronic respiratory failure with hypoxia (HCC)    Autonomic dysfunction    Guillain Barr syndrome ([place])    Paroxysmal atrial fibrillation ([place])    Severe sepsis ([place])     History reviewed. No pertinent surgical history.  There were no vitals filed for this visit.     02/24/21 1405  Symptoms/Limitations  Subjective No new complaints. No falls or pain to report.  Patient is accompained by: Family member (spouse and daughter)  Pertinent History GBS, a fib, hx of covid  Limitations Standing;Walking;Sitting  How long can you stand comfortably? about 1-2 minutes at the countertop with daughter assisting.  Patient Stated Goals wants to get up and walk.  Pain Assessment  Currently in Pain? No/denies       02/24/21 1407  Transfers  Transfers Sit to Stand;Stand to [employer] Transfers  Sit to Stand 4: Min guard;With upper extremity assist;From bed;From chair/3-in-1  Stand to Sit 4: Min guard;With upper extremity assist;To bed;To chair/3-in-1  Ambulation/Gait   Ambulation/Gait Yes  Ambulation/Gait Assistance 4: Min guard  Ambulation/Gait Assistance Details no wheelchair follow. cues on posture at times and step placement.  Ambulation Distance (Feet) 115 Feet (x 2 reps)  Assistive device Rolling walker (bil AFO's)  Gait Pattern Step-to pattern;Step-through pattern;Decreased stride length;Decreased step length - right;Decreased step length - left;Decreased dorsiflexion - right;Decreased dorsiflexion - left;Right steppage;Left steppage;Narrow base of support  Ambulation Surface Level;Indoor  Therapeutic Activites   Therapeutic Activities Other Therapeutic Activities  Other Therapeutic Activities standing at [place] for >/= 4 minutes with supervision working on posture and standing tolerance.  Neuro Re-ed   Neuro Re-ed Details  for balance/NMR: standing with RW at edge of mat- no UE support for 30 sec's x 3 reps, then with light UE support head movements left<>right, up<>down for ~7-8 reps each. min guard to min assist for balance with cues on posture/weight shifitng to assist with balance; then standing to toss bean bags forward, alternating sides for 2 reps of standing, min guard to min assist.      PT Short Term Goals - 02/24/21 1406       PT SHORT TERM GOAL #1   Title Pt will initiate aquatic therapy. ALL STGS DUE 02/16/21    Baseline met    Time --   due to delay in scheduling   Period --    Status Achieved    Target Date 02/16/21      PT SHORT TERM GOAL #2   Title Pt will perform sit <> stand with RW from standard height mat table and w/c with  min A in order to demo improved functional transfers.    Baseline min A from standard height mat table.    Time --    Status Achieved      PT SHORT TERM GOAL #3   Title Pt will tolerate standing at RW for 4 minutes with supervision in order to demo improved standing tolerance for ADLs.    Baseline 02/24/21: met in session today    Time --    Period --    Status Achieved      PT SHORT TERM GOAL #4    Title Pt will perform stand pivot transfer with min A with RW and B AFOs in order to demo improved functional transfers.    Baseline min guard on 02/17/21    Time --    Period --    Status Achieved      PT SHORT TERM GOAL #5   Title Pt will ambulate at least 230' with min guard with RW and B AFOs in order to demo improved household mobility.    Baseline met on 02/17/21    Time --    Period --    Status Achieved                            PT Long Term Goals - 01/04/21 1635       PT LONG TERM GOAL #1   Title Pt and pt's caregiver will be independent with final HEP in order to build upon functional gains made in therapy. ALL LTGS DUE 03/16/21    Baseline reviewed and upgraded on 01/04/21 - will continue to benefit from updates    Time 10   due to delay in scheduling   Period Weeks    Status Achieved    Target Date 03/16/21      PT LONG TERM GOAL #2   Title Pt will perform stand pivot transfer with min guard with RW and B AFOs in order to demo improved functional transfers    Baseline --    Time 10    Period Weeks    Status New      PT LONG TERM GOAL #3   Title Pt will ambulate at least 300' with RW and B AFOs with supervision in order to demo improved household mobility.    Baseline --    Time 10    Period Weeks    Status New      PT LONG TERM GOAL #4   Title Pt will ambulate at least 100' over outdoor paved surfaces with min guard with RW.    Baseline --    Time 10    Period Weeks    Status New      PT LONG TERM GOAL #5   Title Pt will perform sit <> stand with RW from standard height mat table and w/c with min guard in order to demo improved functional transfers.    Baseline --    Time 10    Period Weeks    Status New               02/24/21 1406  Plan  Clinical Impression Statement Today's skilled session initially addressed remaining STG with pt meeting that goal. Remainder of session continued to address gait and standing balance with no  issues noted or reported in session. The pt is making steady progress and should benefit from continued PT to progress toward unmet goals.  Personal Factors and Comorbidities Comorbidity 3+;Past/Current Experience (pt very active prior to diagnosis)  Comorbidities GBS, a fib, hx of covid.  Examination-Activity Limitations Bathing;Bed Mobility;Caring for [employer];Locomotion Level;Dressing;Hygiene/Grooming;Transfers;Stand;Toileting  Examination-Participation Restrictions Cleaning;Community Activity;Yard Work;Meal Prep;Shop;Laundry;Driving;Occupation (exercise, cycling)  Pt will benefit from skilled therapeutic intervention in order to improve on the following deficits Decreased activity tolerance;Decreased coordination;Decreased balance;Decreased endurance;Decreased range of motion;Decreased strength;Difficulty walking;Impaired sensation;Postural dysfunction  Stability/Clinical Decision Making Evolving/Moderate complexity  Rehab Potential Good  PT Frequency 2x / week (1-2x a week)  PT Duration  (7-8 weeks)  PT Treatment/Interventions Manual techniques;Therapeutic exercise;Gait training;Neuromuscular re-education;Aquatic Therapy;Orthotic Fit/Training  PT Next Visit Plan sit <> stand training. gait with RW and B anterior allard toe off; continue with strengthening, standing balance with decreased UE support. SciFit for strengthening and aerobic activity.  PT Home Exercise Plan [id number]  Consulted and Agree with Plan of Care Patient;Family member/caregiver  Family Member Consulted pt's wife and daughter.         Patient will benefit from skilled therapeutic intervention in order to improve the following deficits and impairments:  Decreased activity tolerance, Decreased coordination, Decreased balance, Decreased endurance, Decreased range of motion, Decreased strength, Difficulty walking, Impaired sensation, Postural dysfunction  Visit Diagnosis: Difficulty in walking, not  elsewhere classified  Abnormal posture  Muscle weakness (generalized)  Other symptoms and signs involving the nervous system     Problem List Patient Active Problem List   Diagnosis Date Noted   Acute on chronic respiratory failure with hypoxia ([place])    Guillain Barr syndrome ([place])    Paroxysmal atrial fibrillation ([place])    Autonomic dysfunction    Severe sepsis ([place])     Willow Ora, PTA, [facility] [address], [place], [place] [address] [phone] 02/28/21, 12:06 PM   Name: Joseph Mayo MRN: [id number] Date of Birth: [birth year]

## 2021-03-01 ENCOUNTER — Other Ambulatory Visit: Payer: Self-pay

## 2021-03-01 ENCOUNTER — Ambulatory Visit: Payer: No Typology Code available for payment source | Admitting: Physical Therapy

## 2021-03-01 ENCOUNTER — Encounter: Payer: Self-pay | Admitting: Physical Therapy

## 2021-03-01 DIAGNOSIS — R293 Abnormal posture: Secondary | ICD-10-CM

## 2021-03-01 DIAGNOSIS — M6281 Muscle weakness (generalized): Secondary | ICD-10-CM

## 2021-03-01 DIAGNOSIS — R29818 Other symptoms and signs involving the nervous system: Secondary | ICD-10-CM

## 2021-03-01 DIAGNOSIS — R262 Difficulty in walking, not elsewhere classified: Secondary | ICD-10-CM | POA: Diagnosis not present

## 2021-03-01 NOTE — Therapy (Signed)
[facility] [address] [place], [place], [address] Phone: [phone]   Fax:  [phone]  Physical Therapy Treatment  Patient Details  Name: Joseph Mayo MRN: [id number] Date of Birth: 08/04/1946 Referring Provider (PT): Novitt, Cecelia A   Encounter Date: 03/01/2021   PT End of Session - 03/01/21 1159     Visit Number 25    Number of Visits 31    Authorization Type [place]    Authorization Time Period 15 visits through 04/28/21    Authorization - Visit Number 9    Authorization - Number of Visits 15    PT Start Time 1100    PT Stop Time 1145    PT Time Calculation (min) 45 min    Equipment Utilized During Treatment Gait belt   buoyancy cuffs, pool floatation barbell/noodle   Activity Tolerance Patient tolerated treatment well    Behavior During Therapy WFL for tasks assessed/performed             Past Medical History:  Diagnosis Date   Acute on chronic respiratory failure with hypoxia (HCC)    Autonomic dysfunction    Guillain Barr syndrome (HCC)    Paroxysmal atrial fibrillation (HCC)    Severe sepsis (HCC)     History reviewed. No pertinent surgical history.  There were no vitals filed for this visit.   Subjective Assessment - 03/01/21 1158     Subjective Denies any falls.  Has been wearing loaner AFO's and walked a lot yesterday.    Patient is accompained by: Family member   spouse and daughter   Pertinent History GBS, a fib, hx of covid    Limitations Standing;Walking;Sitting    How long can you stand comfortably? about 1-2 minutes at the countertop with daughter assisting.    Patient Stated Goals wants to get up and walk.    Currently in Pain? No/denies            Aquatic therapy at [facility]   Patient seen for aquatic therapy today.  Treatment took place in water 3.6-4.6 feet deep depending upon activity.  Pt entered the pool via step electric lift with +2 assist for  safety.  Pt transferred w/c<>lift chair with set up and supervision/CGA.     Performed runners stretch at pool edge x 30 sec each side.     Placed gait belt on pt for safety.  Gait training with pt holding pool edge vs HHA of PTA and min assist with moderate verbal cues for foot positioning before stepping.  Pt initially with high steppage gait but did improve with cues despite not having AFO's. Performed gait training vertical and horizontal in pool. Performed side stepping in 4.0 ft.    Standing at pool edge with bil UE support for bil hip flexion, hip extension, hamstring curl, hip flexion moving into extension and hip abd x 15 reps each with ankle buoyancy cuff.  Forward step weight shifting with 1 UE support of wall x 10 reps each side.  Bil LE squats at wall x 15 x 2 sets then single leg x 10 reps each side.    Pt requires buoyancy of water for support for safety with gait training and with standing balance; viscosity of water needed for resistance for strengthening          PT Short Term Goals - 02/24/21 1406       PT SHORT TERM GOAL #1   Title Pt will initiate aquatic   therapy. ALL STGS DUE 02/16/21    Baseline met    Time --   due to delay in scheduling   Period --    Status Achieved    Target Date 02/16/21      PT SHORT TERM GOAL #2   Title Pt will perform sit <> stand with RW from standard height mat table and w/c with min A in order to demo improved functional transfers.    Baseline min A from standard height mat table.    Time --    Status Achieved      PT SHORT TERM GOAL #3   Title Pt will tolerate standing at RW for 4 minutes with supervision in order to demo improved standing tolerance for ADLs.    Baseline 02/24/21: met in session today    Time --    Period --    Status Achieved      PT SHORT TERM GOAL #4   Title Pt will perform stand pivot transfer with min A with RW and B AFOs in order to demo improved functional transfers.    Baseline min guard on 02/17/21     Time --    Period --    Status Achieved      PT SHORT TERM GOAL #5   Title Pt will ambulate at least 230' with min guard with RW and B AFOs in order to demo improved household mobility.    Baseline met on 02/17/21    Time --    Period --    Status Achieved               PT Long Term Goals - 01/04/21 1635       PT LONG TERM GOAL #1   Title Pt and pt's caregiver will be independent with final HEP in order to build upon functional gains made in therapy. ALL LTGS DUE 03/16/21    Baseline reviewed and upgraded on 01/04/21 - will continue to benefit from updates    Time 10   due to delay in scheduling   Period Weeks    Status Achieved    Target Date 03/16/21      PT LONG TERM GOAL #2   Title Pt will perform stand pivot transfer with min guard with RW and B AFOs in order to demo improved functional transfers    Baseline --    Time 10    Period Weeks    Status New      PT LONG TERM GOAL #3   Title Pt will ambulate at least 300' with RW and B AFOs with supervision in order to demo improved household mobility.    Baseline --    Time 10    Period Weeks    Status New      PT LONG TERM GOAL #4   Title Pt will ambulate at least 100' over outdoor paved surfaces with min guard with RW.    Baseline --    Time 10    Period Weeks    Status New      PT LONG TERM GOAL #5   Title Pt will perform sit <> stand with RW from standard height mat table and w/c with min guard in order to demo improved functional transfers.    Baseline --    Time 10    Period Weeks    Status New                     Plan - 03/01/21 1200     Clinical Impression Statement Pt continues to require significant UE support with aquatic session.  Was able to stand without UE support for <10 seconds today with use of UE's in water to aid with balance.  Cont per poc.    Personal Factors and Comorbidities Comorbidity 3+;Past/Current Experience;Fitness   pt very active prior to diagnosis   Comorbidities  GBS, a fib, hx of covid.    Examination-Activity Limitations Bathing;Bed Mobility;Caring for [employer];Locomotion Level;Dressing;Hygiene/Grooming;Transfers;Stand;Toileting    Examination-Participation Restrictions Cleaning;Community Activity;Yard Work;Meal Prep;Shop;Laundry;Driving;Occupation   exercise, cycling   Stability/Clinical Decision Making Evolving/Moderate complexity    Rehab Potential Good    PT Frequency 2x / week   1-2x a week   PT Duration --   7-8 weeks   PT Treatment/Interventions Manual techniques;Therapeutic exercise;Gait training;Neuromuscular re-education;Aquatic Therapy;Orthotic Fit/Training    PT Next Visit Plan sit <> stand training. gait with RW and B anterior allard toe off; continue with strengthening, standing balance with decreased UE support. SciFit for strengthening and aerobic activity.    PT Home Exercise Plan [id number]    Consulted and Agree with Plan of Care Patient;Family member/caregiver    Family Member Consulted pt's wife and daughter.             Patient will benefit from skilled therapeutic intervention in order to improve the following deficits and impairments:  Decreased activity tolerance, Decreased coordination, Decreased balance, Decreased endurance, Decreased range of motion, Decreased strength, Difficulty walking, Impaired sensation, Postural dysfunction  Visit Diagnosis: Difficulty in walking, not elsewhere classified  Abnormal posture  Muscle weakness (generalized)  Other symptoms and signs involving the nervous system     Problem List Patient Active Problem List   Diagnosis Date Noted   Acute on chronic respiratory failure with hypoxia (HCC)    Guillain Barr syndrome ([place])    Paroxysmal atrial fibrillation ([place])    Autonomic dysfunction    Severe sepsis ([place])    Narda Bonds, [place] 03/01/21 12:04 PM Phone: [phone] Fax: [place] [address] [place], [place], [address] Phone: [phone]   Fax:  [phone]  Name: Joseph Mayo MRN: [id number] Date of Birth: [birth year]

## 2021-03-02 ENCOUNTER — Encounter: Payer: Self-pay | Admitting: Physical Therapy

## 2021-03-02 ENCOUNTER — Ambulatory Visit: Payer: No Typology Code available for payment source | Admitting: Physical Therapy

## 2021-03-02 ENCOUNTER — Ambulatory Visit: Payer: No Typology Code available for payment source | Admitting: Occupational Therapy

## 2021-03-02 ENCOUNTER — Encounter: Payer: Self-pay | Admitting: Occupational Therapy

## 2021-03-02 DIAGNOSIS — R208 Other disturbances of skin sensation: Secondary | ICD-10-CM

## 2021-03-02 DIAGNOSIS — M6281 Muscle weakness (generalized): Secondary | ICD-10-CM

## 2021-03-02 DIAGNOSIS — R262 Difficulty in walking, not elsewhere classified: Secondary | ICD-10-CM | POA: Diagnosis not present

## 2021-03-02 DIAGNOSIS — R29818 Other symptoms and signs involving the nervous system: Secondary | ICD-10-CM

## 2021-03-02 DIAGNOSIS — R293 Abnormal posture: Secondary | ICD-10-CM

## 2021-03-02 NOTE — Therapy (Signed)
[facility] [address] [place], [place], [address] Phone: [phone]   Fax:  [phone]  Occupational Therapy Evaluation  Patient Details  Name: Joseph Mayo MRN: [id number] Date of Birth: 1946-06-27 Referring Provider (OT): Sim Boast   Encounter Date: 03/02/2021   OT End of Session - 03/02/21 1822     Visit Number 1    Number of Visits 7    Date for OT Re-Evaluation 05/01/21    Authorization Type [facility]    OT Start Time 1230    OT Stop Time 1315    OT Time Calculation (min) 45 min    Activity Tolerance Patient tolerated treatment well    Behavior During Therapy [facility] for tasks assessed/performed             Past Medical History:  Diagnosis Date   Acute on chronic respiratory failure with hypoxia (HCC)    Autonomic dysfunction    Guillain Barr syndrome (HCC)    Paroxysmal atrial fibrillation (HCC)    Severe sepsis (HCC)     History reviewed. No pertinent surgical history.  There were no vitals filed for this visit.   Subjective Assessment - 03/02/21 1234     Subjective  I cannot use my wheelchair to get into the pantry    Patient is accompanied by: Family member   Lupita Leash - Wife   Currently in Pain? No/denies               [facility] OT Assessment - 03/02/21 0001       Assessment   Medical Diagnosis GBS    Referring Provider (OT) Sim Boast    Onset Date/Surgical Date 06/17/20    Hand Dominance Right    Prior Therapy acute care, HHPT and OT for a couple months      Precautions   Precautions Fall      Balance Screen   Has the patient fallen in the past 6 months Yes    How many times? 1    Has the patient had a decrease in activity level because of a fear of falling?  No      Prior Function   Level of Independence Independent with basic ADLs    Vocation Full time employment    Solicitor - [employer]    Leisure rode bike, played with grandkids      ADL    Eating/Feeding Independent    Grooming Modified independent    Upper Body Bathing Minimal assistance    Lower Body Bathing Supervision/safety    Upper Body Dressing Set up    Lower Body Dressing Moderate assistance    Product/process development scientist Comfort height toilet    Tub/Shower Transfer Supervision/safety      IADL   Prior Level of Function Shopping independent    Shopping Needs to be accompanied on any shopping trip   uses motorized cart   Prior Level of Function Light Housekeeping independent   vaccuum, dishes   Light Housekeeping Performs light daily tasks such as dishwashing, bed making    Prior Level of Function Meal Prep independent    Meal Prep Able to complete simple warm meal prep      Written Expression   Dominant Hand Right    Handwriting 100% legible      Vision - History   Baseline Vision --   glasses for driving     Cognition  Overall Cognitive Status Within Functional Limits for tasks assessed      Observation/Other Assessments   Focus on Therapeutic Outcomes (FOTO)  NA      Posture/Postural Control   Posture/Postural Control Postural limitations    Postural Limitations Posterior pelvic tilt;Rounded Shoulders      Sensation   Light Touch Impaired by gross assessment   reports tingling     Coordination   Gross Motor Movements are Fluid and Coordinated Yes    Fine Motor Movements are Fluid and Coordinated Yes    9 Hole Peg Test Right;Left    Right 9 Hole Peg Test 28.32    Left 9 Hole Peg Test 28.78      Perception   Perception Within Functional Limits      Praxis   Praxis Intact      Tone   Assessment Location Right Upper Extremity;Left Upper Extremity      ROM / Strength   AROM / PROM / Strength AROM;Strength      AROM   Overall AROM  Within functional limits for tasks performed      Strength   Overall Strength Within functional limits for tasks performed    Overall Strength Comments Shoulder /  Elbows      Hand Function   Right Hand Gross Grasp Impaired    Right Hand Grip (lbs) 55.1    Right Hand Lateral Pinch 12 lbs    Right Hand 3 Point Pinch 12 lbs    Left Hand Gross Grasp Impaired    Left Hand Grip (lbs) 40.1    Left Hand Lateral Pinch 10 lbs    Left 3 point pinch 8 lbs      RUE Tone   RUE Tone Within Functional Limits                             OT Education - 03/02/21 1821     Education Details Results of OT eval and potential goals and plan of care    Person(s) Educated Patient;Spouse    Methods Explanation    Comprehension Verbalized understanding              OT Short Term Goals - 03/02/21 1832       OT SHORT TERM GOAL #1   Title Patient will complete IADL task while standing with UE support for greater than 5 min    Baseline 4 min    Time 4    Period Weeks    Status New               OT Long Term Goals - 03/02/21 1833       OT LONG TERM GOAL #1   Title Patient will complete an HEP designed to improve grip strength BUE    Time 6    Period Weeks    Status New      OT LONG TERM GOAL #2   Title Patient will shower with modified independence    Time 6    Period Weeks    Status New      OT LONG TERM GOAL #3   Title Patient will demonstrate awareness of return to driving recommendations    Time 6    Period Weeks    Status New      OT LONG TERM GOAL #4   Title Patient will demonstrate sufficient balance in standing to pull up LB clothing using altrnating single UE  support    Time 6    Period Weeks    Status New      OT LONG TERM GOAL #5   Title Patient will dmeonstrate 5 lb increase in grip strength bilaterally to aide with opening bottles, packages, etc    Time 6    Period Weeks    Status New                   Plan - 03/02/21 1822     Clinical Impression Statement Patient is a 75 year old male referred to Neuro OPOT for GBS. Diagnosed with [name] on 06/17/20 after COVID infection. Pt  discharged to [facility] on 07/15/20. Pt admitted from [facility] via EMS to [facility] on 12/2 with acute on chronic resp failure. Pt then discharged home on December 18th with HH through the [place].  Patient presents to OT evaluation with his wife Lupita Leash. Patient reports making significant progress in OP PT and improving with functional mobility.  Patient demonstrates decreased balance, decreased standing tolerance, decreased hand strength, and decreased sensation which impede overall independence with ADL/IADL.  Patient and wife very motivated for functional improvement.    OT Occupational Profile and History Problem Focused Assessment - Including review of records relating to presenting problem    Occupational performance deficits (Please refer to evaluation for details): ADL's;IADL's;Work    Body Structure / Function / Physical Skills ADL;Strength;Balance;Tone;Body mechanics;IADL;Endurance;Sensation;Mobility    Rehab Potential Good    Clinical Decision Making Limited treatment options, no task modification necessary    Comorbidities Affecting Occupational Performance: May have comorbidities impacting occupational performance    Modification or Assistance to Complete Evaluation  No modification of tasks or assist necessary to complete eval    OT Frequency 1x / week    OT Duration 6 weeks   6 sessions   OT Treatment/Interventions Self-care/ADL training;Balance training;Therapeutic activities;Therapeutic exercise;Neuromuscular education;Functional Mobility Training;Patient/family education;DME and/or AE instruction    Plan Needs hand strengthening and HEP, Begin to transition ADL to standing where possible    Consulted and Agree with Plan of Care Patient;Family member/caregiver             Patient will benefit from skilled therapeutic intervention in order to improve the following deficits and impairments:   Body Structure / Function / Physical Skills: ADL, Strength, Balance, Tone, Body mechanics, IADL,  Endurance, Sensation, Mobility       Visit Diagnosis: Muscle weakness (generalized) - Plan: Ot plan of care cert/re-cert  Abnormal posture - Plan: Ot plan of care cert/re-cert  Other symptoms and signs involving the nervous system - Plan: Ot plan of care cert/re-cert  Other disturbances of skin sensation - Plan: Ot plan of care cert/re-cert    Problem List Patient Active Problem List   Diagnosis Date Noted   Acute on chronic respiratory failure with hypoxia (HCC)    Guillain Barr syndrome (HCC)    Paroxysmal atrial fibrillation (HCC)    Autonomic dysfunction    Severe sepsis [facility])     Collier Salina, OTR/L 03/02/2021, 6:38 PM  [facility] [address] [place], [place], [address] Phone: [phone]   Fax:  [phone]  Name: DAQUAVION CATALA MRN: [id number] Date of Birth: 09-15-1945

## 2021-03-02 NOTE — Therapy (Signed)
[facility] [address] [place], [place], [address] Phone: [phone]   Fax:  [phone]  Physical Therapy Treatment  Patient Details  Name: Joseph Mayo MRN: [id number] Date of Birth: [birth year] Referring Provider (PT): Jacky Kindle A   Encounter Date: 03/02/2021   PT End of Session - 03/02/21 1322     Visit Number 26    Number of Visits 31    Authorization Type [place]    Authorization Time Period 15 visits through 04/28/21    Authorization - Visit Number 10    Authorization - Number of Visits 15    PT Start Time 1317    PT Stop Time 1400    PT Time Calculation (min) 43 min    Equipment Utilized During Treatment Gait belt   buoyancy cuffs, pool floatation barbell/noodle   Activity Tolerance Patient tolerated treatment well    Behavior During Therapy WFL for tasks assessed/performed             Past Medical History:  Diagnosis Date   Acute on chronic respiratory failure with hypoxia (HCC)    Autonomic dysfunction    Guillain Barr syndrome (HCC)    Paroxysmal atrial fibrillation (HCC)    Severe sepsis (HCC)     History reviewed. No pertinent surgical history.  There were no vitals filed for this visit.   Subjective Assessment - 03/02/21 1321     Subjective No new complaints. No falls. Walked a lot on Sunday with loaner braces (inside and on the porch). No pain.    Patient is accompained by: Family member   spouse   Pertinent History GBS, a fib, hx of covid    Limitations Standing;Walking;Sitting    How long can you stand comfortably? about 1-2 minutes at the countertop with daughter assisting.    Patient Stated Goals wants to get up and walk.    Currently in Pain? No/denies                   [facility] Adult PT Treatment/Exercise - 03/02/21 1323       Transfers   Transfers Sit to Stand;Stand to [employer] Transfers    Sit to Stand 4: Min guard;With upper extremity assist;From  bed;From chair/3-in-1    Stand to Sit 4: Min guard;With upper extremity assist;To bed;To chair/3-in-1      Ambulation/Gait   Ambulation/Gait Yes    Ambulation/Gait Assistance 4: Min guard    Ambulation/Gait Assistance Details no wheelchair follow for gait indoors with increased distance before needing a rest break. cues on posture and step placement. with gait outdoors did have wheelchair follow with it not used (for safety)- cues on use of walker over grooves of pavement and on grass with min guard assist needed for balance/safety.    Ambulation Distance (Feet) 230 Feet   x1, 310 x1 in/outdoors combined   Assistive device Rolling walker    Gait Pattern Step-to pattern;Step-through pattern;Decreased stride length;Decreased step length - right;Decreased step length - left;Decreased dorsiflexion - right;Decreased dorsiflexion - left;Right steppage;Left steppage;Narrow base of support    Ambulation Surface Level;Indoor;Unlevel;Outdoor;Paved;Gravel;Grass    Stairs Yes    Stairs Assistance 4: Min guard    Stairs Assistance Details (indicate cue type and reason) cues for sequencing and weight shifting, also to advance hands on rails. min guard assist for safety.    Stair Management Technique Two rails;Step to pattern;Forwards    Number of Stairs 4    Height of Stairs 6  Ramp 4: Min assist;Other (comment)   min guard assist   Ramp Details (indicate cue type and reason) x2 reps with RW/bil AFO's with cues on sequencing/technique with decreased assistance needed on 2cd rep.                      PT Short Term Goals - 02/24/21 1406       PT SHORT TERM GOAL #1   Title Pt will initiate aquatic therapy. ALL STGS DUE 02/16/21    Baseline met    Time --   due to delay in scheduling   Period --    Status Achieved    Target Date 02/16/21      PT SHORT TERM GOAL #2   Title Pt will perform sit <> stand with RW from standard height mat table and w/c with min A in order to demo improved  functional transfers.    Baseline min A from standard height mat table.    Time --    Status Achieved      PT SHORT TERM GOAL #3   Title Pt will tolerate standing at RW for 4 minutes with supervision in order to demo improved standing tolerance for ADLs.    Baseline 02/24/21: met in session today    Time --    Period --    Status Achieved      PT SHORT TERM GOAL #4   Title Pt will perform stand pivot transfer with min A with RW and B AFOs in order to demo improved functional transfers.    Baseline min guard on 02/17/21    Time --    Period --    Status Achieved      PT SHORT TERM GOAL #5   Title Pt will ambulate at least 230' with min guard with RW and B AFOs in order to demo improved household mobility.    Baseline met on 02/17/21    Time --    Period --    Status Achieved               PT Long Term Goals - 01/04/21 1635       PT LONG TERM GOAL #1   Title Pt and pt's caregiver will be independent with final HEP in order to build upon functional gains made in therapy. ALL LTGS DUE 03/16/21    Baseline reviewed and upgraded on 01/04/21 - will continue to benefit from updates    Time 10   due to delay in scheduling   Period Weeks    Status Achieved    Target Date 03/16/21      PT LONG TERM GOAL #2   Title Pt will perform stand pivot transfer with min guard with RW and B AFOs in order to demo improved functional transfers    Baseline --    Time 10    Period Weeks    Status New      PT LONG TERM GOAL #3   Title Pt will ambulate at least 300' with RW and B AFOs with supervision in order to demo improved household mobility.    Baseline --    Time 10    Period Weeks    Status New      PT LONG TERM GOAL #4   Title Pt will ambulate at least 100' over outdoor paved surfaces with min guard with RW.    Baseline --    Time 10    Period Weeks  Status New      PT LONG TERM GOAL #5   Title Pt will perform sit <> stand with RW from standard height mat table and w/c with  min guard in order to demo improved functional transfers.    Baseline --    Time 10    Period Weeks    Status New                   Plan - 03/02/21 1322     Clinical Impression Statement Today's skilled session continued to focus on gait with RW/bil AFO's with increased consecutive distance acheived indoors. Pt was given okay to ambulate with family assist into/out of PT at this clinic only as it's a familiar place with short distance. Discussed he should continue with use of wheelchair with aquatitcs and other public outings at this time. Pt and family verblaized understanding.  Progressed to initiation of gait training on outdoor compliant surfaces with min guard assist/cues on walker management on compliant surfaces. Also initiated training with RW/braces on ramps and stairs with min guard to min assist needed. The pt is making steady progress and should benefit from continued PT to progress toward unmet goals.    Personal Factors and Comorbidities Comorbidity 3+;Past/Current Experience;Fitness   pt very active prior to diagnosis   Comorbidities GBS, a fib, hx of covid.    Examination-Activity Limitations Bathing;Bed Mobility;Caring for [employer];Locomotion Level;Dressing;Hygiene/Grooming;Transfers;Stand;Toileting    Examination-Participation Restrictions Cleaning;Community Activity;Yard Work;Meal Prep;Shop;Laundry;Driving;Occupation   exercise, cycling   Stability/Clinical Decision Making Evolving/Moderate complexity    Rehab Potential Good    PT Frequency 2x / week   1-2x a week   PT Duration --   7-8 weeks   PT Treatment/Interventions Manual techniques;Therapeutic exercise;Gait training;Neuromuscular re-education;Aquatic Therapy;Orthotic Fit/Training    PT Next Visit Plan sit <> stand training. gait with RW and B anterior allard toe off; continue with strengthening, standing balance with decreased UE support. SciFit for strengthening and aerobic activity.    PT Home  Exercise Plan [id number]    Consulted and Agree with Plan of Care Patient;Family member/caregiver    Family Member Consulted pt's wife and daughter.             Patient will benefit from skilled therapeutic intervention in order to improve the following deficits and impairments:  Decreased activity tolerance, Decreased coordination, Decreased balance, Decreased endurance, Decreased range of motion, Decreased strength, Difficulty walking, Impaired sensation, Postural dysfunction  Visit Diagnosis: Difficulty in walking, not elsewhere classified  Abnormal posture  Muscle weakness (generalized)     Problem List Patient Active Problem List   Diagnosis Date Noted   Acute on chronic respiratory failure with hypoxia ([place])    Guillain Barr syndrome ([place])    Paroxysmal atrial fibrillation ([place])    Autonomic dysfunction    Severe sepsis ([place])    Willow Ora, PTA, [facility] [address], [place], [place] [address] [phone] 03/02/21, 2:44 PM    Name: Joseph Mayo MRN: [id number] Date of Birth: February 05, 1946

## 2021-03-04 ENCOUNTER — Ambulatory Visit: Payer: No Typology Code available for payment source | Admitting: Physical Therapy

## 2021-03-08 ENCOUNTER — Other Ambulatory Visit: Payer: Self-pay

## 2021-03-08 ENCOUNTER — Ambulatory Visit: Payer: No Typology Code available for payment source | Admitting: Physical Therapy

## 2021-03-08 ENCOUNTER — Encounter: Payer: Self-pay | Admitting: Physical Therapy

## 2021-03-08 DIAGNOSIS — M6281 Muscle weakness (generalized): Secondary | ICD-10-CM

## 2021-03-08 DIAGNOSIS — R262 Difficulty in walking, not elsewhere classified: Secondary | ICD-10-CM | POA: Diagnosis not present

## 2021-03-08 DIAGNOSIS — R29818 Other symptoms and signs involving the nervous system: Secondary | ICD-10-CM

## 2021-03-08 DIAGNOSIS — R293 Abnormal posture: Secondary | ICD-10-CM

## 2021-03-08 NOTE — Therapy (Signed)
[facility] [address] [place], [place], [address] Phone: [phone]   Fax:  [phone]  Physical Therapy Treatment  Patient Details  Name: Joseph Mayo MRN: [id number] Date of Birth: Feb 03, 1946 Referring Provider (PT): Greer Pickerel A   Encounter Date: 03/08/2021   PT End of Session - 03/08/21 1207     Visit Number 27    Number of Visits 31    Authorization Type [facility]    Authorization Time Period 15 visits through 04/28/21    Authorization - Visit Number 11    Authorization - Number of Visits 15    PT Start Time 1100    PT Stop Time 8099    PT Time Calculation (min) 45 min    Equipment Utilized During Treatment Gait belt;Other (comment)   pool floatation barbell/noodle   Activity Tolerance Patient tolerated treatment well    Behavior During Therapy WFL for tasks assessed/performed             Past Medical History:  Diagnosis Date   Acute on chronic respiratory failure with hypoxia (HCC)    Autonomic dysfunction    Guillain Barr syndrome (HCC)    Paroxysmal atrial fibrillation (HCC)    Severe sepsis ([place])     History reviewed. No pertinent surgical history.  There were no vitals filed for this visit.   Subjective Assessment - 03/08/21 1204     Subjective Denies any changes.  Walked into church on Sunday.    Patient is accompained by: Family member   spouse   Pertinent History GBS, a fib, hx of covid    Limitations Standing;Walking;Sitting    How long can you stand comfortably? about 1-2 minutes at the countertop with daughter assisting.    Patient Stated Goals wants to get up and walk.    Currently in Pain? No/denies            Aquatic therapy at [facility]   Patient seen for aquatic therapy today.  Treatment took place in water 3.6-4.6 feet deep depending upon activity.  Pt entered the pool via step electric lift with +2 assist for safety.  Pt transferred w/c<>lift chair  with set up and supervision/CGA.     Performed runners stretch at pool edge x 30 sec each side.     Placed gait belt on pt for safety.  Gait training with pt holding pool edge vs HHA of PTA and min assist with moderate verbal cues for foot positioning before stepping.  Performed gait training vertical and horizontal in pool. Performed side stepping in 4.0 ft. And backward walking. Progressed to only CGA of PTA and no UE support with forward and backward gait.    Bil LE squats at wall x 15 x 2 sets then single leg x 10 reps each side.    Sit<>stand from pool bench with bil UE support x 10 reps with cues for forward lean.  Stepping on/off pool step x 10 reps then off/back on x 10 reps.  Pt in 4.0 ft of water.  Difficulty at times due to decreased ankle dorsiflexion range.  Pt requires buoyancy of water for support for safety with gait training and with standing balance; viscosity of water needed for resistance for strengthening       PT Short Term Goals - 02/24/21 1406       PT SHORT TERM GOAL #1   Title Pt will initiate aquatic therapy. ALL STGS DUE 02/16/21    Baseline  met    Time --   due to delay in scheduling   Period --    Status Achieved    Target Date 02/16/21      PT SHORT TERM GOAL #2   Title Pt will perform sit <> stand with RW from standard height mat table and w/c with min A in order to demo improved functional transfers.    Baseline min A from standard height mat table.    Time --    Status Achieved      PT SHORT TERM GOAL #3   Title Pt will tolerate standing at RW for 4 minutes with supervision in order to demo improved standing tolerance for ADLs.    Baseline 02/24/21: met in session today    Time --    Period --    Status Achieved      PT SHORT TERM GOAL #4   Title Pt will perform stand pivot transfer with min A with RW and B AFOs in order to demo improved functional transfers.    Baseline min guard on 02/17/21    Time --    Period --    Status Achieved       PT SHORT TERM GOAL #5   Title Pt will ambulate at least 230' with min guard with RW and B AFOs in order to demo improved household mobility.    Baseline met on 02/17/21    Time --    Period --    Status Achieved               PT Long Term Goals - 01/04/21 1635       PT LONG TERM GOAL #1   Title Pt and pt's caregiver will be independent with final HEP in order to build upon functional gains made in therapy. ALL LTGS DUE 03/16/21    Baseline reviewed and upgraded on 01/04/21 - will continue to benefit from updates    Time 10   due to delay in scheduling   Period Weeks    Status Achieved    Target Date 03/16/21      PT LONG TERM GOAL #2   Title Pt will perform stand pivot transfer with min guard with RW and B AFOs in order to demo improved functional transfers    Baseline --    Time 10    Period Weeks    Status New      PT LONG TERM GOAL #3   Title Pt will ambulate at least 300' with RW and B AFOs with supervision in order to demo improved household mobility.    Baseline --    Time 10    Period Weeks    Status New      PT LONG TERM GOAL #4   Title Pt will ambulate at least 100' over outdoor paved surfaces with min guard with RW.    Baseline --    Time 10    Period Weeks    Status New      PT LONG TERM GOAL #5   Title Pt will perform sit <> stand with RW from standard height mat table and w/c with min guard in order to demo improved functional transfers.    Baseline --    Time 10    Period Weeks    Status New                   Plan - 03/08/21 1209     Clinical  Impression Statement Patient able to ambulate in pool with decreased reliance on UE assist today with min-mod assist for balance recovery at times.  Still presents with steppage gait due to decrease active bil dorsiflexion.  Cont per poc.    Personal Factors and Comorbidities Comorbidity 3+;Past/Current Experience;Fitness   pt very active prior to diagnosis   Comorbidities GBS, a fib, hx of covid.     Examination-Activity Limitations Bathing;Bed Mobility;Caring for [employer];Locomotion Level;Dressing;Hygiene/Grooming;Transfers;Stand;Toileting    Examination-Participation Restrictions Cleaning;Community Activity;Yard Work;Meal Prep;Shop;Laundry;Driving;Occupation   exercise, cycling   Stability/Clinical Decision Making Evolving/Moderate complexity    Rehab Potential Good    PT Frequency 2x / week   1-2x a week   PT Duration --   7-8 weeks   PT Treatment/Interventions Manual techniques;Therapeutic exercise;Gait training;Neuromuscular re-education;Aquatic Therapy;Orthotic Fit/Training    PT Next Visit Plan sit <> stand training. gait with RW and B anterior allard toe off; continue with strengthening, standing balance with decreased UE support. SciFit for strengthening and aerobic activity.    PT Home Exercise Plan [id number]    Consulted and Agree with Plan of Care Patient;Family member/caregiver    Family Member Consulted pt's wife and daughter.             Patient will benefit from skilled therapeutic intervention in order to improve the following deficits and impairments:  Decreased activity tolerance, Decreased coordination, Decreased balance, Decreased endurance, Decreased range of motion, Decreased strength, Difficulty walking, Impaired sensation, Postural dysfunction  Visit Diagnosis: Muscle weakness (generalized)  Difficulty in walking, not elsewhere classified  Abnormal posture  Other symptoms and signs involving the nervous system     Problem List Patient Active Problem List   Diagnosis Date Noted   Acute on chronic respiratory failure with hypoxia (HCC)    Guillain Barr syndrome ([place])    Paroxysmal atrial fibrillation ([place])    Autonomic dysfunction    Severe sepsis ([place])    Narda Bonds, PTA [facility] 03/08/21 3:37 PM Phone: [phone] Fax: [facility] [address] [place], [place], [address] Phone: [phone]   Fax:  [phone]  Name: Joseph Mayo MRN: [id number] Date of Birth: [birth year]

## 2021-03-10 ENCOUNTER — Ambulatory Visit: Payer: No Typology Code available for payment source | Admitting: Physical Therapy

## 2021-03-10 ENCOUNTER — Other Ambulatory Visit: Payer: Self-pay

## 2021-03-10 DIAGNOSIS — M6281 Muscle weakness (generalized): Secondary | ICD-10-CM

## 2021-03-10 DIAGNOSIS — R262 Difficulty in walking, not elsewhere classified: Secondary | ICD-10-CM

## 2021-03-10 DIAGNOSIS — R293 Abnormal posture: Secondary | ICD-10-CM

## 2021-03-10 DIAGNOSIS — R29818 Other symptoms and signs involving the nervous system: Secondary | ICD-10-CM

## 2021-03-10 NOTE — Therapy (Signed)
[facility] [address] [place], [place], [address] Phone: [phone]   Fax:  [phone]  Physical Therapy Treatment  Patient Details  Name: Joseph Mayo MRN: [id number] Date of Birth: [birth year] Referring Provider (PT): Jacky Kindle A   Encounter Date: 03/10/2021   PT End of Session - 03/10/21 1405     Visit Number 28    Number of Visits 31    Authorization Type VA    Authorization Time Period 15 visits through 04/28/21    Authorization - Visit Number 12    Authorization - Number of Visits 15    PT Start Time 1402    PT Stop Time 1445    PT Time Calculation (min) 43 min    Equipment Utilized During Treatment Gait belt;Other (comment)   bil AFO's   Activity Tolerance Patient tolerated treatment well    Behavior During Therapy WFL for tasks assessed/performed             Past Medical History:  Diagnosis Date   Acute on chronic respiratory failure with hypoxia (HCC)    Autonomic dysfunction    Guillain Barr syndrome (HCC)    Paroxysmal atrial fibrillation (HCC)    Severe sepsis (HCC)     No past surgical history on file.  There were no vitals filed for this visit.   Subjective Assessment - 03/10/21 1406     Subjective No new complaints. No falls. Walking into therapy today with walker and loaner braces. Walking at house more too. Walked into church with son and into the [place] office yesterday. Spoke to [place] doctor yesterday about his order for the braces, they also mentioned that rehab would be asking about additional visits.    Patient is accompained by: Family member   spouse and daughter   Pertinent History GBS, a fib, hx of covid    Limitations Standing;Walking;Sitting    How long can you stand comfortably? about 1-2 minutes at the countertop with daughter assisting.    Patient Stated Goals wants to get up and walk.    Currently in Pain? No/denies                     [facility] Adult  PT Treatment/Exercise - 03/10/21 1410       Transfers   Transfers Sit to Stand;Stand to [employer] Transfers    Sit to Stand 4: Min guard;With upper extremity assist;From bed;From chair/3-in-1    Stand to Sit 4: Min guard;With upper extremity assist;To bed;To chair/3-in-1      Ambulation/Gait   Ambulation/Gait Yes    Ambulation/Gait Assistance 4: Min guard    Ambulation/Gait Assistance Details obstacle course set up along track with red mat along one long side<>3 cones on short side<>blue mat along long side<> 2 bolsters on short side<>back to where red mat is- 2 laps around walking over mat when at them, stepping over bolsters with cues on sequencing/min assist for balance and weaving around cones- min guard to min assist with obstacles. remainder of gait around clinic with session.    Ambulation Distance (Feet) 230 Feet   x1 with obstacles, plus around clinic with session   Assistive device Rolling walker;Other (Comment)   bil AFO's   Gait Pattern Step-through pattern;Decreased stride length;Decreased step length - right;Decreased step length - left;Decreased dorsiflexion - right;Decreased dorsiflexion - left;Right steppage;Left steppage;Narrow base of support    Ambulation Surface Level;Indoor      High Level Balance  High Level Balance Activities Side stepping;Marching forwards;Backward walking    High Level Balance Comments on blue mat in parallel bars with UE support/bil AFO's: 4 laps each with cues on posture/forward gaze and ex form/technique.      Neuro Re-ed    Neuro Re-ed Details  for balance/NMR: static standing on blue mat with wide base of support: no UE support for 30 sec's x 3 reps with min guard to min assist for balance, cues on posture/weight shifting. then alternating UE raises for ~5 reps each side, progressing to bil UE raises together for 5 reps with min to mod assist, cues on posture/weight shifting.      Knee/Hip Exercises: Aerobic   Other Aerobic [place] UE/LE's  (seat 20 and arms 9) on level 2.5 x 5 minutes with goal >/= 60 steps per minute for strengthening and activity tolerance                      PT Short Term Goals - 02/24/21 1406       PT SHORT TERM GOAL #1   Title Pt will initiate aquatic therapy. ALL STGS DUE 02/16/21    Baseline met    Time --   due to delay in scheduling   Period --    Status Achieved    Target Date 02/16/21      PT SHORT TERM GOAL #2   Title Pt will perform sit <> stand with RW from standard height mat table and w/c with min A in order to demo improved functional transfers.    Baseline min A from standard height mat table.    Time --    Status Achieved      PT SHORT TERM GOAL #3   Title Pt will tolerate standing at RW for 4 minutes with supervision in order to demo improved standing tolerance for ADLs.    Baseline 02/24/21: met in session today    Time --    Period --    Status Achieved      PT SHORT TERM GOAL #4   Title Pt will perform stand pivot transfer with min A with RW and B AFOs in order to demo improved functional transfers.    Baseline min guard on 02/17/21    Time --    Period --    Status Achieved      PT SHORT TERM GOAL #5   Title Pt will ambulate at least 230' with min guard with RW and B AFOs in order to demo improved household mobility.    Baseline met on 02/17/21    Time --    Period --    Status Achieved               PT Long Term Goals - 01/04/21 1635       PT LONG TERM GOAL #1   Title Pt and pt's caregiver will be independent with final HEP in order to build upon functional gains made in therapy. ALL LTGS DUE 03/16/21    Baseline reviewed and upgraded on 01/04/21 - will continue to benefit from updates    Time 10   due to delay in scheduling   Period Weeks    Status Achieved    Target Date 03/16/21      PT LONG TERM GOAL #2   Title Pt will perform stand pivot transfer with min guard with RW and B AFOs in order to demo improved functional transfers    Baseline  --  Time 10    Period Weeks    Status New      PT LONG TERM GOAL #3   Title Pt will ambulate at least 300' with RW and B AFOs with supervision in order to demo improved household mobility.    Baseline --    Time 10    Period Weeks    Status New      PT LONG TERM GOAL #4   Title Pt will ambulate at least 100' over outdoor paved surfaces with min guard with RW.    Baseline --    Time 10    Period Weeks    Status New      PT LONG TERM GOAL #5   Title Pt will perform sit <> stand with RW from standard height mat table and w/c with min guard in order to demo improved functional transfers.    Baseline --    Time 10    Period Weeks    Status New                   Plan - 03/10/21 1406     Clinical Impression Statement Today's skilled session continued to focus on gait with RW/braces with emphasis on compliant surfaces and obstacle negotiation today with up to min assist. Also began to work on balance training on compliant surfaces with decreased UE support with min to mod assist needed. Ended today's session with Scifit to address strengthening and activity tolerance. No issues noted or reported in session. The pt is progressing toward goals and should benefit from continued PT to progress toward unmet goals.    Personal Factors and Comorbidities Comorbidity 3+;Past/Current Experience;Fitness   pt very active prior to diagnosis   Comorbidities GBS, a fib, hx of covid.    Examination-Activity Limitations Bathing;Bed Mobility;Caring for [employer];Locomotion Level;Dressing;Hygiene/Grooming;Transfers;Stand;Toileting    Examination-Participation Restrictions Cleaning;Community Activity;Yard Work;Meal Prep;Shop;Laundry;Driving;Occupation   exercise, cycling   Stability/Clinical Decision Making Evolving/Moderate complexity    Rehab Potential Good    PT Frequency 2x / week   1-2x a week   PT Duration --   7-8 weeks   PT Treatment/Interventions Manual  techniques;Therapeutic exercise;Gait training;Neuromuscular re-education;Aquatic Therapy;Orthotic Fit/Training    PT Next Visit Plan sit <> stand training. gait with RW and B anterior allard toe off; continue with strengthening, standing balance with decreased UE support. SciFit for strengthening and aerobic activity.    PT Home Exercise Plan [id number]    Consulted and Agree with Plan of Care Patient;Family member/caregiver    Family Member Consulted pt's wife and daughter.             Patient will benefit from skilled therapeutic intervention in order to improve the following deficits and impairments:  Decreased activity tolerance, Decreased coordination, Decreased balance, Decreased endurance, Decreased range of motion, Decreased strength, Difficulty walking, Impaired sensation, Postural dysfunction  Visit Diagnosis: Difficulty in walking, not elsewhere classified  Muscle weakness (generalized)  Abnormal posture  Other symptoms and signs involving the nervous system     Problem List Patient Active Problem List   Diagnosis Date Noted   Acute on chronic respiratory failure with hypoxia ([place])    Guillain Barr syndrome ([place])    Paroxysmal atrial fibrillation ([place])    Autonomic dysfunction    Severe sepsis ([place])    Willow Ora, PTA, [facility] Outpatient Neuro [facility] [address], [place], [place] [address] [phone] 03/10/21, 9:19 PM   Name: Joseph Mayo MRN: [id number] Date of  Birth: 07-20-1946

## 2021-03-17 ENCOUNTER — Ambulatory Visit: Payer: No Typology Code available for payment source | Admitting: Occupational Therapy

## 2021-03-19 ENCOUNTER — Ambulatory Visit: Payer: No Typology Code available for payment source | Admitting: Physical Therapy

## 2021-03-19 ENCOUNTER — Encounter: Payer: Self-pay | Admitting: Physical Therapy

## 2021-03-19 ENCOUNTER — Other Ambulatory Visit: Payer: Self-pay

## 2021-03-19 DIAGNOSIS — R262 Difficulty in walking, not elsewhere classified: Secondary | ICD-10-CM | POA: Diagnosis not present

## 2021-03-19 DIAGNOSIS — M6281 Muscle weakness (generalized): Secondary | ICD-10-CM

## 2021-03-19 DIAGNOSIS — R293 Abnormal posture: Secondary | ICD-10-CM

## 2021-03-19 DIAGNOSIS — R29818 Other symptoms and signs involving the nervous system: Secondary | ICD-10-CM

## 2021-03-19 NOTE — Therapy (Addendum)
[facility] [address] [place], [place], [address] Phone: [phone]   Fax:  [phone]  Physical Therapy Treatment/Re-Cert  Patient Details  Name: Joseph Mayo MRN: [id number] Date of Birth: Feb 16, 1946 Referring Provider (PT): Greer Pickerel A   Encounter Date: 03/19/2021     03/19/21 1107  PT Visits / Re-Eval  Visit Number 29  Number of Visits 46  Date for PT Re-Evaluation 06/14/21  Authorization  Authorization Type VA  Authorization Time Period 15 visits through 04/28/21, requested additional 15 visits from the [place] - Visit Number 13  Authorization - Number of Visits 15  PT Time Calculation  PT Start Time 1102  PT Stop Time 1145  PT Time Calculation (min) 43 min  PT - End of Session  Equipment Utilized During Treatment Gait belt;Other (comment) (bil AFO's)  Activity Tolerance Patient tolerated treatment well  Behavior During Therapy WFL for tasks assessed/performed   Past Medical History:  Diagnosis Date   Acute on chronic respiratory failure with hypoxia (HCC)    Autonomic dysfunction    Guillain Barr syndrome (HCC)    Paroxysmal atrial fibrillation (HCC)    Severe sepsis ([place])     History reviewed. No pertinent surgical history.  There were no vitals filed for this visit.   Subjective Assessment - 03/19/21 1106     Subjective No new complatins. No falls. Has been walking most everywhere. Still using scooter to get to blueberry bushes, however has been standing with holding onto a branch. Has not heard anything from the [facility] or [facility] about the braces.    Patient is accompained by: Family member   spouse and daughter   Pertinent History GBS, a fib, hx of covid    Limitations Standing;Walking;Sitting    How long can you stand comfortably? about 1-2 minutes at the countertop with daughter assisting.    Patient Stated Goals wants to get up and walk.    Currently in Pain?  No/denies                 03/19/21 1109  Assessment  Medical Diagnosis GBS  Referring Provider (PT) Novitt, Cecelia A  Onset Date/Surgical Date 06/17/20  Hand Dominance Right  Prior Function  Level of Independence Independent;Independent with basic ADLs  Transfers  Transfers Sit to Stand;Stand to Sit  Sit to Stand 5: Supervision;With upper extremity assist;From bed;From chair/3-in-1  Stand to Sit 5: Supervision;With upper extremity assist;To bed;To chair/3-in-1  Ambulation/Gait  Ambulation/Gait Yes  Ambulation/Gait Assistance 5: Supervision  Ambulation/Gait Assistance Details occasional cues for posture. no balance loss with gait.  Ambulation Distance (Feet) 325 Feet (x1, plusin/out/ around clinc with session)  Assistive device Rolling walker;Other (Comment)  Gait Pattern Step-through pattern;Decreased stride length;Decreased step length - right;Decreased step length - left;Decreased dorsiflexion - right;Decreased dorsiflexion - left;Right steppage;Left steppage;Narrow base of support  Ambulation Surface Level;Indoor  Gait velocity 25.07 sec's= 1.31 ft/sec with RW/AFO's  Standardized Balance Assessment  Standardized Balance Assessment Berg Balance Test;TUG  Berg Balance Test  Sit to Stand 3  Standing Unsupported 3  Sitting with Back Unsupported but Feet Supported on Floor or Stool 4  Stand to Sit 3  Transfers 3  Standing Unsupported with Eyes Closed 2 (posterior balance loss ~5 sec's)  Standing Unsupported with Feet Together 0 (~8 sec's)  From Standing, Reach Forward with Outstretched Arm 1  From Standing Position, Pick up Object from Floor 0  From Standing Position, Turn to Look Behind Over each Shoulder  1  Turn 360 Degrees 0  Standing Unsupported, Alternately Place Feet on Step/Stool 1 (min HHA)  Standing Unsupported, One Foot in Front 1  Standing on One Leg 1  Total Score 23  Timed Up and Go Test  TUG Normal TUG  Normal TUG (seconds) 24.94 (with RW/AFO's,  min guard assist)       [facility] Adult PT Treatment/Exercise - 03/19/21 1109       Transfers   Transfers Sit to Stand;Stand to Sit    Sit to Stand 5: Supervision;With upper extremity assist;From bed;From chair/3-in-1    Stand to Sit 5: Supervision;With upper extremity assist;To bed;To chair/3-in-1      Ambulation/Gait   Ambulation/Gait Yes    Ambulation/Gait Assistance 5: Supervision    Ambulation/Gait Assistance Details occasional cues for posture. no balance loss with gait.    Ambulation Distance (Feet) 325 Feet   x1, plusin/out/ around clinc with session   Assistive device Rolling walker;Other (Comment)    Gait Pattern Step-through pattern;Decreased stride length;Decreased step length - right;Decreased step length - left;Decreased dorsiflexion - right;Decreased dorsiflexion - left;Right steppage;Left steppage;Narrow base of support    Ambulation Surface Level;Indoor    Gait velocity 25.07 sec's= 1.31 ft/sec with RW/AFO's                  PT Short Term Goals - 02/24/21 1406       PT SHORT TERM GOAL #1   Title Pt will initiate aquatic therapy. ALL STGS DUE 02/16/21    Baseline met    Time --   due to delay in scheduling   Period --    Status Achieved    Target Date 02/16/21      PT SHORT TERM GOAL #2   Title Pt will perform sit <> stand with RW from standard height mat table and w/c with min A in order to demo improved functional transfers.    Baseline min A from standard height mat table.    Time --    Status Achieved      PT SHORT TERM GOAL #3   Title Pt will tolerate standing at RW for 4 minutes with supervision in order to demo improved standing tolerance for ADLs.    Baseline 02/24/21: met in session today    Time --    Period --    Status Achieved      PT SHORT TERM GOAL #4   Title Pt will perform stand pivot transfer with min A with RW and B AFOs in order to demo improved functional transfers.    Baseline min guard on 02/17/21    Time --    Period --     Status Achieved      PT SHORT TERM GOAL #5   Title Pt will ambulate at least 230' with min guard with RW and B AFOs in order to demo improved household mobility.    Baseline met on 02/17/21    Time --    Period --    Status Achieved            Revised STGs:  PT Short Term Goals - 02/24/21 1406       PT SHORT TERM GOAL #1   Title Pt will initiate aquatic therapy. ALL STGS DUE 02/16/21    Baseline met    Time --   due to delay in scheduling   Period --    Status Achieved    Target Date 02/16/21  PT SHORT TERM GOAL #2   Title Pt will perform sit <> stand with RW from standard height mat table and w/c with min A in order to demo improved functional transfers.    Baseline min A from standard height mat table.    Time --    Status Achieved      PT SHORT TERM GOAL #3   Title Pt will tolerate standing at RW for 4 minutes with supervision in order to demo improved standing tolerance for ADLs.    Baseline 02/24/21: met in session today    Time --    Period --    Status Achieved      PT SHORT TERM GOAL #4   Title Pt will perform stand pivot transfer with min A with RW and B AFOs in order to demo improved functional transfers.    Baseline min guard on 02/17/21    Time --    Period --    Status Achieved      PT SHORT TERM GOAL #5   Title Pt will ambulate at least 230' with min guard with RW and B AFOs in order to demo improved household mobility.    Baseline met on 02/17/21    Time --    Period --    Status Achieved               PT Long Term Goals - 03/19/21 1425       PT LONG TERM GOAL #1   Title Pt and pt's caregiver will be independent with final HEP in order to build upon functional gains made in therapy. ALL LTGS DUE 03/16/21    Baseline 03/19/21: met with current program- will continue to benefit from updates    Time --   due to delay in scheduling   Status Achieved      PT LONG TERM GOAL #2   Title Pt will perform stand pivot transfer with min guard with  RW and B AFOs in order to demo improved functional transfers    Baseline 03/19/21: met with gait when turning with approach to surface to sit down    Status Achieved      PT LONG TERM GOAL #3   Title Pt will ambulate at least 300' with RW and B AFOs with supervision in order to demo improved household mobility.    Baseline 03/19/21: met in session today    Status Achieved      PT LONG TERM GOAL #4   Title Pt will ambulate at least 100' over outdoor paved surfaces with min guard with RW.    Baseline 03/19/21: outdoor gait has been initiated with prior sessions for this distance with min assist    Status Partially Met      PT LONG TERM GOAL #5   Title Pt will perform sit <> stand with RW from standard height mat table and w/c with min guard in order to demo improved functional transfers.    Baseline 03/19/21: met at supervision level today    Status Achieved            Revised LTGs for re-cert:     PT Long Term Goals - 03/29/21 0908       PT LONG TERM GOAL #1   Title Pt and pt's caregiver will be independent with final HEP in order to build upon functional gains made in therapy. ALL LTGS DUE 05/24/21    Baseline 03/19/21: met with current program- will continue to  benefit from updates    Time 8   due to delay in scheduling   Period Weeks    Status New    Target Date 05/24/21      PT LONG TERM GOAL #2   Title Pt will improve BERG score to at least a 30/56 in order to demo decr fall risk.    Baseline 23/56    Time 8    Period Weeks    Status New      PT LONG TERM GOAL #3   Title Pt will ambulate at least 500' outdoors with RW and B AFOs and perform a curb with supervision in order to demo improved community mobility.    Time 8    Period Weeks    Status New      PT LONG TERM GOAL #4   Title 5x sit <> stand goal written as appropriate in order to demo improved functional BLE strength.    Baseline has not yet been assessed.    Time 8    Period Weeks    Status New      PT  LONG TERM GOAL #5   Title Pt will improve gait speed to at least 1.8 ft/sec with RW and B AFOs in order to demo improved gait efficiency.    Baseline 1.31 ft/sec with RW    Time 8    Period Weeks    Status New                   03/19/21 1107  Plan  Clinical Impression Statement Today's skilled session focused on progress toward LTGs with all goals met except for outdoor gait goal which is partially met. Also set baseline scores for 10 meter 1.31 ft/sec and TUG 24.94 sec's, both with RW/Bil AFO's. Also set baseline for [facility] Test with score of 23/56. Primary PT to complete recert. From primary PT: pt has made signficant progress with PT and will continue to benefit from skilled PT to work on gait, balance, strength, endurance, in order to improve functional mobility and independence and decr pt's risk of falls. Pt is currently using B AFOs from PT clinic as pt is still waiting on the [place] to send his referral to appropriate brace company. Both PT and pt's family has been in contact with the [place] to be updated on the status. STGs and LTGs updated as appropriate. The pt is progressing toward goals and should benefit from continued PT to progress toward unmet goals.  Personal Factors and Comorbidities Comorbidity 3+;Past/Current Experience;Fitness (pt very active prior to diagnosis)  Comorbidities GBS, a fib, hx of covid.  Examination-Activity Limitations Bathing;Bed Mobility;Caring for [employer];Locomotion Level;Dressing;Hygiene/Grooming;Transfers;Stand;Toileting  Examination-Participation Restrictions Cleaning;Community Activity;Yard Work;Meal Prep;Shop;Laundry;Driving;Occupation (exercise, cycling)  Pt will benefit from skilled therapeutic intervention in order to improve on the following deficits Decreased activity tolerance;Decreased coordination;Decreased balance;Decreased endurance;Decreased range of motion;Decreased strength;Difficulty walking;Impaired sensation;Postural  dysfunction  Stability/Clinical Decision Making Evolving/Moderate complexity  Rehab Potential Good  PT Frequency 2x / week (1-2x a week)  PT Duration 12 weeks  PT Treatment/Interventions Manual techniques;Therapeutic exercise;Gait training;Neuromuscular re-education;Aquatic Therapy;Orthotic Fit/Training  PT Next Visit Plan add balance components to HEP.  continue with strengthening, standing balance with decreased UE support. SciFit for strengthening and aerobic activity. Gait over/around objects, gait distance and gait on outdoor surfaces  PT Home Exercise Plan [id number]  Consulted and Agree with Plan of Care Patient;Family member/caregiver  Family Member Consulted pt's wife and daughter.     Patient will  benefit from skilled therapeutic intervention in order to improve the following deficits and impairments:  Decreased activity tolerance, Decreased coordination, Decreased balance, Decreased endurance, Decreased range of motion, Decreased strength, Difficulty walking, Impaired sensation, Postural dysfunction  Visit Diagnosis: Difficulty in walking, not elsewhere classified  Muscle weakness (generalized)  Abnormal posture  Other symptoms and signs involving the nervous system     Problem List Patient Active Problem List   Diagnosis Date Noted   Acute on chronic respiratory failure with hypoxia ([place])    Guillain Barr syndrome ([place])    Paroxysmal atrial fibrillation ([place])    Autonomic dysfunction    Severe sepsis ([place])     Willow Ora, PTA, [facility] Outpatient Neuro [facility] [address], [place], [place] [address] [phone] 03/19/21, 2:41 PM   Name: Joseph Mayo MRN: [id number] Date of Birth: [birth year]   Addendum by: Janann August, PT, DPT 03/29/21 9:11 AM

## 2021-03-22 ENCOUNTER — Ambulatory Visit: Payer: No Typology Code available for payment source | Attending: Family Medicine | Admitting: Physical Therapy

## 2021-03-22 ENCOUNTER — Other Ambulatory Visit: Payer: Self-pay

## 2021-03-22 DIAGNOSIS — M6281 Muscle weakness (generalized): Secondary | ICD-10-CM | POA: Diagnosis present

## 2021-03-22 DIAGNOSIS — R29818 Other symptoms and signs involving the nervous system: Secondary | ICD-10-CM | POA: Insufficient documentation

## 2021-03-22 DIAGNOSIS — R262 Difficulty in walking, not elsewhere classified: Secondary | ICD-10-CM | POA: Insufficient documentation

## 2021-03-22 DIAGNOSIS — R293 Abnormal posture: Secondary | ICD-10-CM | POA: Insufficient documentation

## 2021-03-22 DIAGNOSIS — R208 Other disturbances of skin sensation: Secondary | ICD-10-CM | POA: Diagnosis present

## 2021-03-23 ENCOUNTER — Encounter: Payer: Self-pay | Admitting: Physical Therapy

## 2021-03-23 NOTE — Therapy (Addendum)
[facility] [address] [place], [place], [address] Phone: [phone]   Fax:  [phone]  Physical Therapy Treatment  Patient Details  Name: Joseph Mayo MRN: [id number] Date of Birth: 1946/04/06 Referring Provider (PT): Greer Pickerel A   Encounter Date: 03/22/2021  Addended by primary PT Janann August, PT, DPT 03/29/21 9:18 AM  To reflect re-cert POC and dates.     03/23/21 1620  PT Visits / Re-Eval  Visit Number 30  Number of Visits 46  Date for PT Re-Evaluation 06/14/21  Authorization  Authorization Type [facility]  Authorization Time Period 15 visits through 04/28/21  Authorization - Visit Number 14  Authorization - Number of Visits 15  PT Time Calculation  PT Start Time 1100  PT Stop Time 1145  PT Time Calculation (min) 45 min  PT - End of Session  Equipment Utilized During Treatment Other (comment) (ankle weights, floatation belt, long barbell)  Activity Tolerance Patient tolerated treatment well  Behavior During Therapy WFL for tasks assessed/performed     Past Medical History:  Diagnosis Date   Acute on chronic respiratory failure with hypoxia (HCC)    Autonomic dysfunction    Guillain Barr syndrome ([place])    Paroxysmal atrial fibrillation (HCC)    Severe sepsis ([place])     History reviewed. No pertinent surgical history.  There were no vitals filed for this visit.   Subjective Assessment - 03/23/21 1618     Subjective Denies any falls or changes.    Patient is accompained by: Family member   spouse and daughter   Pertinent History GBS, a fib, hx of covid    Limitations Standing;Walking;Sitting    How long can you stand comfortably? about 1-2 minutes at the countertop with daughter assisting.    Patient Stated Goals wants to get up and walk.    Currently in Pain? No/denies             Aquatic therapy at [facility]   Patient seen for aquatic therapy today.  Treatment  took place in water 3.6-4.6 feet deep depending upon activity.  Pt entered the pool via step electric lift with +2 assist for safety.  Pt transferred w/c<>lift chair with set up and supervision/CGA.     Performed runners stretch at pool edge x 30 sec each side x 2 reps.   Gait training with pt holding pool edge vs HHA of PTA and min assist with moderate verbal cues for foot positioning before stepping.  Performed gait training vertical and horizontal in pool. Performed side stepping in 4.0 ft. Progressed to only CGA of PTA and no UE support with forward and backward gait.     Sit<>stand from pool bench with bil UE support x 10 reps x 2 sets with cues for forward lean.  Seated on pool bench for bil LAQ and hip flexion x 15 reps then repeated same with ankle weights x 15 reps.   In supine position with neck noodle, pool noodle and floatation belt with PTA support.  Performed bicycling LE's x 30 reps x 2 sets.  Performed hip abd/add x 20 reps x 2 sets.  Placed pt's feet on pool wall and PTA assisted pt into hip/knee flexion then pt pushing off pool wall into hip/knee extension x 10 reps.  Standing at pool wall for bil hip add kicks to wall x 10 reps.    Pt requires buoyancy of water for support for safety with gait training and with  standing balance; viscosity of water needed for resistance for strengthening        PT Short Term Goals - 02/24/21 1406       PT SHORT TERM GOAL #1   Title Pt will initiate aquatic therapy. ALL STGS DUE 02/16/21    Baseline met    Time --   due to delay in scheduling   Period --    Status Achieved    Target Date 02/16/21      PT SHORT TERM GOAL #2   Title Pt will perform sit <> stand with RW from standard height mat table and w/c with min A in order to demo improved functional transfers.    Baseline min A from standard height mat table.    Time --    Status Achieved      PT SHORT TERM GOAL #3   Title Pt will tolerate standing at RW for 4 minutes with  supervision in order to demo improved standing tolerance for ADLs.    Baseline 02/24/21: met in session today    Time --    Period --    Status Achieved      PT SHORT TERM GOAL #4   Title Pt will perform stand pivot transfer with min A with RW and B AFOs in order to demo improved functional transfers.    Baseline min guard on 02/17/21    Time --    Period --    Status Achieved      PT SHORT TERM GOAL #5   Title Pt will ambulate at least 230' with min guard with RW and B AFOs in order to demo improved household mobility.    Baseline met on 02/17/21    Time --    Period --    Status Achieved               PT Long Term Goals - 03/19/21 1425       PT LONG TERM GOAL #1   Title Pt and pt's caregiver will be independent with final HEP in order to build upon functional gains made in therapy. ALL LTGS DUE 03/16/21    Baseline 03/19/21: met with current program- will continue to benefit from updates    Time --   due to delay in scheduling   Status Achieved      PT LONG TERM GOAL #2   Title Pt will perform stand pivot transfer with min guard with RW and B AFOs in order to demo improved functional transfers    Baseline 03/19/21: met with gait when turning with approach to surface to sit down    Status Achieved      PT LONG TERM GOAL #3   Title Pt will ambulate at least 300' with RW and B AFOs with supervision in order to demo improved household mobility.    Baseline 03/19/21: met in session today    Status Achieved      PT LONG TERM GOAL #4   Title Pt will ambulate at least 100' over outdoor paved surfaces with min guard with RW.    Baseline 03/19/21: outdoor gait has been initiated with prior sessions for this distance with min assist    Status Partially Met      PT LONG TERM GOAL #5   Title Pt will perform sit <> stand with RW from standard height mat table and w/c with min guard in order to demo improved functional transfers.    Baseline 03/19/21: met at  supervision level today     Status Achieved                    03/23/21 1622  Plan  Clinical Impression Statement Pt's balance improved from previous sessions with increased functional strength evidenced by decreased reliance on UE support.  Pt to be renewed by primary PT and await [facility] approval.  Cont per poc.  Personal Factors and Comorbidities Comorbidity 3+;Past/Current Experience;Fitness (pt very active prior to diagnosis)  Comorbidities GBS, a fib, hx of covid.  Examination-Activity Limitations Bathing;Bed Mobility;Caring for [employer];Locomotion Level;Dressing;Hygiene/Grooming;Transfers;Stand;Toileting  Examination-Participation Restrictions Cleaning;Community Activity;Yard Work;Meal Prep;Shop;Laundry;Driving;Occupation (exercise, cycling)  Pt will benefit from skilled therapeutic intervention in order to improve on the following deficits Decreased activity tolerance;Decreased coordination;Decreased balance;Decreased endurance;Decreased range of motion;Decreased strength;Difficulty walking;Impaired sensation;Postural dysfunction  Stability/Clinical Decision Making Evolving/Moderate complexity  Rehab Potential Good  PT Frequency 2x / week (1-2x a week)  PT Duration 12 weeks  PT Treatment/Interventions Manual techniques;Therapeutic exercise;Gait training;Neuromuscular re-education;Aquatic Therapy;Orthotic Fit/Training  PT Next Visit Plan add balance components to HEP.  continue with strengthening, standing balance with decreased UE support. SciFit for strengthening and aerobic activity. Gait over/around objects, gait distance and gait on outdoor surfaces  PT Home Exercise Plan [id number]  Consulted and Agree with Plan of Care Patient;Family member/caregiver  Family Member Consulted pt's wife and daughter.     Patient will benefit from skilled therapeutic intervention in order to improve the following deficits and impairments:  Decreased activity tolerance, Decreased coordination, Decreased  balance, Decreased endurance, Decreased range of motion, Decreased strength, Difficulty walking, Impaired sensation, Postural dysfunction  Visit Diagnosis: Difficulty in walking, not elsewhere classified  Muscle weakness (generalized)  Abnormal posture  Other symptoms and signs involving the nervous system     Problem List Patient Active Problem List   Diagnosis Date Noted   Acute on chronic respiratory failure with hypoxia (HCC)    Guillain Barr syndrome ([place])    Paroxysmal atrial fibrillation ([place])    Autonomic dysfunction    Severe sepsis ([place])    Narda Bonds, PTA [facility] 03/23/21 4:31 PM Phone: [phone] Fax: [facility] [address] [place], [place], [address] Phone: [phone]   Fax:  [phone]  Name: PLACIDO HANGARTNER MRN: [id number] Date of Birth: 04-Jan-1946  Janann August, PT, DPT 03/29/21 9:19 AM

## 2021-03-24 ENCOUNTER — Ambulatory Visit: Payer: No Typology Code available for payment source | Admitting: Occupational Therapy

## 2021-03-24 ENCOUNTER — Ambulatory Visit: Payer: No Typology Code available for payment source | Admitting: Physical Therapy

## 2021-03-29 ENCOUNTER — Other Ambulatory Visit: Payer: Self-pay

## 2021-03-29 ENCOUNTER — Ambulatory Visit: Payer: No Typology Code available for payment source | Admitting: Physical Therapy

## 2021-03-29 VITALS — BP 82/43 | HR 52

## 2021-03-29 DIAGNOSIS — M6281 Muscle weakness (generalized): Secondary | ICD-10-CM

## 2021-03-29 DIAGNOSIS — R262 Difficulty in walking, not elsewhere classified: Secondary | ICD-10-CM | POA: Diagnosis not present

## 2021-03-29 DIAGNOSIS — R29818 Other symptoms and signs involving the nervous system: Secondary | ICD-10-CM

## 2021-03-29 DIAGNOSIS — R293 Abnormal posture: Secondary | ICD-10-CM

## 2021-03-29 NOTE — Addendum Note (Signed)
Addended by: Drake Leach on: 03/29/2021 09:16 AM   Modules accepted: Orders

## 2021-03-29 NOTE — Therapy (Addendum)
[facility] [address] [place], [place], [address] Phone: [phone]   Fax:  [phone]  Physical Therapy Treatment  Patient Details  Name: Joseph Mayo MRN: [id number] Date of Birth: Sep 19, 1945 Referring Provider (PT): Greer Pickerel A   Encounter Date: 03/29/2021   PT End of Session - 03/29/21 1321     Visit Number 31    Number of Visits 62    Date for PT Re-Evaluation 06/14/21    Authorization Type VA    Authorization Time Period 15 visits through 04/28/21 - awaiting 15 more VA visits    Authorization - Visit Number 15    Authorization - Number of Visits 15    PT Start Time 1232    PT Stop Time 1316    PT Time Calculation (min) 44 min    Equipment Utilized During Treatment Gait belt    Activity Tolerance Patient tolerated treatment well;Patient limited by fatigue   limited by low BP   Behavior During Therapy WFL for tasks assessed/performed             Past Medical History:  Diagnosis Date   Acute on chronic respiratory failure with hypoxia (HCC)    Autonomic dysfunction    Guillain Barr syndrome ([place])    Paroxysmal atrial fibrillation ([place])    Severe sepsis ([place])     No past surgical history on file.  Vitals:   03/29/21 1302  BP: (!) 82/43  Pulse: (!) 52     Subjective Assessment - 03/29/21 1236     Subjective Doing good, no falls. Should be getting in his braces in 2-3 weeks.    Patient is accompained by: Family member   spouse and daughter   Pertinent History GBS, a fib, hx of covid    Limitations Standing;Walking;Sitting    How long can you stand comfortably? about 1-2 minutes at the countertop with daughter assisting.    Patient Stated Goals wants to get up and walk.    Currently in Pain? No/denies                               [facility] Adult PT Treatment/Exercise - 03/29/21 0001       Ambulation/Gait   Ambulation/Gait Yes    Ambulation/Gait Assistance 5:  Supervision    Ambulation/Gait Assistance Details into session with B AFOs    Assistive device Rolling walker    Gait Pattern Step-through pattern;Decreased stride length;Decreased step length - right;Decreased step length - left;Decreased dorsiflexion - right;Decreased dorsiflexion - left;Right steppage;Left steppage;Narrow base of support    Ambulation Surface Level;Indoor    Gait Comments provided pt new tennis balls for his RW      Therapeutic Activites    Therapeutic Activities Other Therapeutic Activities    Other Therapeutic Activities Pt reporting that his L ankle has been not feeling as stable when he has been walking, has mentioned it when he went to get fitted for his AFOs, report orthotist will possibly be putting in a lift or adding a strap to help with ankle stability. after standing balance in // bars pt reporting feeling dizzy, sat pt down and checked pt's BP with it being very low (see vitals above), with pt's daughter reporting that sometimes he has episodes like this and that pt has not eaten breakfast since 8-8:30. Provided pt with water and crackers with pt reporting feeling better and discussed having pt rest and  elevate his legs when he gets home. Wheeled pt out in clinic w/c at end of session vs. Walking out due to low BP                 Balance Exercises - 03/29/21 1317       Balance Exercises: Standing   Standing Eyes Opened Wide (BOA);Limitations    Standing Eyes Opened Limitations with fingertip support at RW: 2 x 5 reps head turns, 2 x 5 reps head nods. in // bars wide: BOS reaching out with LUE across body to tap multidirectional targets on R x8 reps, pt feeling dizzy and then needing to rest. Needing min guard/min A with activities.    Standing Eyes Closed Wide (BOA);3 reps;30 secs;Limitations    Standing Eyes Closed Limitations with fingertip support on RW, min guard   SLS with Vectors Solid surface;Upper extremity assist 1;Limitations    SLS with Vectors  Limitations alternating foot taps to 4" step x10 reps B with single UE support    Step Ups Forward;UE support 2;UE support 1;Limitations;4 inch    Step Ups Limitations x10 reps B beginning with BUE support and then 1 with pt needing min guard with single UE support and incr difficulty stepping up with LLE.               PT Education - 03/29/21 1321     Education Details see TA, getting scheduled for additional appts    Person(s) Educated Patient;Spouse;Child(ren)    Methods Explanation    Comprehension Verbalized understanding              PT Short Term Goals - 03/29/21 1327       PT SHORT TERM GOAL #1   Title Pt will perform 4 steps with B handrails and step to pattern with min guard in order to demo improved functional mobility. ALL STGS DUE 04/26/21    Baseline needs min guard    Time 4    Period Weeks    Status New    Target Date 04/26/21      PT SHORT TERM GOAL #2   Title Pt will improve gait speed to at least 1.5 ft/sec with RW and B AFOs in order to demo improved gait efficiency.    Baseline 1.31 ft/sec with RW    Time 4    Period Weeks    Status New      PT SHORT TERM GOAL #3   Title Pt will decr TUG time to 20 seconds or less with RW and B AFOs in order to demo decr fall risk.    Baseline 24.94 seconds    Time 4    Period Weeks    Status New      PT SHORT TERM GOAL #4   Title Pt will undergo further assessment of 5x sit <> stand with LTG written as appropriate in order to demo improved balance and functional strength.    Baseline not yet assessed.    Time 4    Period Weeks    Status New      PT SHORT TERM GOAL #5   Title Pt will ambulate at least 300' outdoors with RW and B AFOs with supervision in order to demo improved community mobility.    Baseline needs min assist    Time 4    Period Weeks    Status New               PT Long Term Goals -  03/29/21 0908       PT LONG TERM GOAL #1   Title Pt and pt's caregiver will be independent with  final HEP in order to build upon functional gains made in therapy. ALL LTGS DUE 05/24/21    Baseline 03/19/21: met with current program- will continue to benefit from updates    Time 8   due to delay in scheduling   Period Weeks    Status New    Target Date 05/24/21      PT LONG TERM GOAL #2   Title Pt will improve BERG score to at least a 30/56 in order to demo decr fall risk.    Baseline 23/56    Time 8    Period Weeks    Status New      PT LONG TERM GOAL #3   Title Pt will ambulate at least 500' outdoors with RW and B AFOs and perform a curb with supervision in order to demo improved community mobility.    Time 8    Period Weeks    Status New      PT LONG TERM GOAL #4   Title 5x sit <> stand goal written as appropriate in order to demo improved functional BLE strength.    Baseline has not yet been assessed.    Time 8    Period Weeks    Status New      PT LONG TERM GOAL #5   Title Pt will improve gait speed to at least 1.8 ft/sec with RW and B AFOs in order to demo improved gait efficiency.    Baseline 1.31 ft/sec with RW    Time 8    Period Weeks    Status New                   Plan - 03/29/21 1629     Clinical Impression Statement Today's skilled session focused on standing balance strategies working on decr UE support and BLE strengthening with step ups. Pt feeling dizzy after standing balance, assessed pt's BP (see above) with it being low and pt had not eaten any lunch before session. Pt feeling better after given crackers and water. Limited standing activity today. Wheeled pt out in manual clinic w/c at end of session for safety due to low BP    Personal Factors and Comorbidities Comorbidity 3+;Past/Current Experience;Fitness   pt very active prior to diagnosis   Comorbidities GBS, a fib, hx of covid.    Examination-Activity Limitations Bathing;Bed Mobility;Caring for [employer];Locomotion Level;Dressing;Hygiene/Grooming;Transfers;Stand;Toileting     Examination-Participation Restrictions Cleaning;Community Activity;Yard Work;Meal Prep;Shop;Laundry;Driving;Occupation   exercise, cycling   Stability/Clinical Decision Making Evolving/Moderate complexity    Rehab Potential Good    PT Frequency 2x / week   1-2x a week   PT Duration 12 weeks    PT Treatment/Interventions Manual techniques;Therapeutic exercise;Gait training;Neuromuscular re-education;Aquatic Therapy;Orthotic Fit/Training    PT Next Visit Plan add balance components to HEP.  continue with strengthening, standing balance with decreased UE support. SciFit for strengthening and aerobic activity. Gait over/around objects, gait distance and gait on outdoor surfaces    PT Home Exercise Plan [id number]    Consulted and Agree with Plan of Care Patient;Family member/caregiver    Family Member Consulted pt's wife and daughter.             Patient will benefit from skilled therapeutic intervention in order to improve the following deficits and impairments:  Decreased activity tolerance, Decreased coordination, Decreased balance, Decreased endurance,  Decreased range of motion, Decreased strength, Difficulty walking, Impaired sensation, Postural dysfunction  Visit Diagnosis: Difficulty in walking, not elsewhere classified  Muscle weakness (generalized)  Abnormal posture  Other symptoms and signs involving the nervous system     Problem List Patient Active Problem List   Diagnosis Date Noted   Acute on chronic respiratory failure with hypoxia (HCC)    Guillain Barr syndrome ([place])    Paroxysmal atrial fibrillation ([place])    Autonomic dysfunction    Severe sepsis ([place])     Arliss Journey, PT, DPT  03/29/2021, 4:30 PM  [facility] [address] [place], [place], [address] Phone: [phone]   Fax:  [phone]  Name: Joseph Mayo MRN: [id number] Date of Birth: Jul 09, 1946

## 2021-03-31 ENCOUNTER — Other Ambulatory Visit: Payer: Self-pay

## 2021-03-31 ENCOUNTER — Ambulatory Visit: Payer: No Typology Code available for payment source | Admitting: Occupational Therapy

## 2021-03-31 ENCOUNTER — Encounter: Payer: Self-pay | Admitting: Occupational Therapy

## 2021-03-31 DIAGNOSIS — R293 Abnormal posture: Secondary | ICD-10-CM

## 2021-03-31 DIAGNOSIS — R262 Difficulty in walking, not elsewhere classified: Secondary | ICD-10-CM | POA: Diagnosis not present

## 2021-03-31 DIAGNOSIS — M6281 Muscle weakness (generalized): Secondary | ICD-10-CM

## 2021-03-31 DIAGNOSIS — R208 Other disturbances of skin sensation: Secondary | ICD-10-CM

## 2021-03-31 DIAGNOSIS — R29818 Other symptoms and signs involving the nervous system: Secondary | ICD-10-CM

## 2021-03-31 NOTE — Patient Instructions (Signed)
11. Grip Strengthening (Resistive Putty)   Squeeze putty using thumb and all fingers. Repeat 10 times. Do 1-2 sessions per day.   Extension (Assistive Putty)   Roll putty back and forth, being sure to use all fingertips. Repeat 3 times. Do 1-2 sessions per day.  Then pinch as below.   Palmar Pinch Strengthening (Resistive Putty)   Pinch putty between thumb and each fingertip in turn after rolling out       Pulling taffy -  Using both hands - pull putty apart.   Make it into a ball, and repeat 10 x's.

## 2021-03-31 NOTE — Therapy (Signed)
[facility] [address] [place], [place], [address] Phone: [phone]   Fax:  [phone]  Occupational Therapy Treatment  Patient Details  Name: Joseph Mayo MRN: [id number] Date of Birth: Mar 13, 1946 Referring Provider (OT): Sim Boast   Encounter Date: 03/31/2021   OT End of Session - 03/31/21 1352     Visit Number 2    Number of Visits 7    Date for OT Re-Evaluation 05/01/21    Authorization Type [place]    OT Start Time 1145    OT Stop Time 1225    OT Time Calculation (min) 40 min    Activity Tolerance Patient tolerated treatment well    Behavior During Therapy [facility] for tasks assessed/performed             Past Medical History:  Diagnosis Date   Acute on chronic respiratory failure with hypoxia (HCC)    Autonomic dysfunction    Guillain Barr syndrome (HCC)    Paroxysmal atrial fibrillation (HCC)    Severe sepsis (HCC)     History reviewed. No pertinent surgical history.  There were no vitals filed for this visit.   Subjective Assessment - 03/31/21 1150     Subjective  Patient walked into clinic today!    Patient is accompanied by: Family member    Currently in Pain? No/denies                          OT Treatments/Exercises (OP) - 03/31/21 0001       ADLs   Bathing Patient reports he can now step in and out of shower, but needs assistance for turning on water and getting hand held shower head.  Wife assisting.  DIscussed that goal was for him to get to the point he could shower without assistance.    Home Maintenance Patient reports sitting on stool to wash dishes at home    Driving Reviewed first stage of graduated driving program - empty lot with another licensed driver.    ADL Comments Reviewed short and long term goals.  Patient and family in agreement      Theraputty   Theraputty - Roll green    Theraputty - Grip green    Theraputty - Pinch green       Neurological Re-education Exercises   Other Exercises 1 Worked on standing tolerance and decreased use of UE's for support in standing.  Working to determine amount of sway available in unsupported standing.                    OT Education - 03/31/21 1352     Education Details putty exercises for BUE, first stage of graduated driving program, OT goals    Person(s) Educated Patient;Spouse;Child(ren)    Methods Explanation;Handout;Verbal cues;Demonstration    Comprehension Verbalized understanding;Returned demonstration              OT Short Term Goals - 03/31/21 1153       OT SHORT TERM GOAL #1   Title Patient will complete IADL task while standing with UE support for greater than 5 min    Baseline 4 min    Time 4    Period Weeks    Status On-going               OT Long Term Goals - 03/31/21 1155       OT LONG TERM GOAL #1   Title Patient  will complete an HEP designed to improve grip strength BUE    Time 6    Period Weeks    Status On-going      OT LONG TERM GOAL #2   Title Patient will shower with modified independence    Time 6    Period Weeks    Status On-going      OT LONG TERM GOAL #3   Title Patient will demonstrate awareness of return to driving recommendations    Time 6    Period Weeks    Status On-going      OT LONG TERM GOAL #4   Title Patient will demonstrate sufficient balance in standing to pull up LB clothing using altrnating single UE support    Time 6    Period Weeks    Status On-going      OT LONG TERM GOAL #5   Title Patient will dmeonstrate 5 lb increase in grip strength bilaterally to aide with opening bottles, packages, etc    Time 6    Period Weeks    Status On-going                   Plan - 03/31/21 1353     Clinical Impression Statement Patient continues to improve with functional mobility and is now using wheelchair less in home environment.    OT Occupational Profile and History Problem Focused  Assessment - Including review of records relating to presenting problem    Occupational performance deficits (Please refer to evaluation for details): ADL's;IADL's;Work    Body Structure / Function / Physical Skills ADL;Strength;Balance;Tone;Body mechanics;IADL;Endurance;Sensation;Mobility    Rehab Potential Good    Clinical Decision Making Limited treatment options, no task modification necessary    Comorbidities Affecting Occupational Performance: May have comorbidities impacting occupational performance    Modification or Assistance to Complete Evaluation  No modification of tasks or assist necessary to complete eval    OT Frequency 1x / week    OT Duration 6 weeks   6 sessions   OT Treatment/Interventions Self-care/ADL training;Balance training;Therapeutic activities;Therapeutic exercise;Neuromuscular education;Functional Mobility Training;Patient/family education;DME and/or AE instruction    Plan Needs hand strengthening - gripper, clothespins in standing , Begin to transition ADL to standing where possible - did he try driving in empty lot?    Consulted and Agree with Plan of Care Patient;Family member/caregiver             Patient will benefit from skilled therapeutic intervention in order to improve the following deficits and impairments:   Body Structure / Function / Physical Skills: ADL, Strength, Balance, Tone, Body mechanics, IADL, Endurance, Sensation, Mobility       Visit Diagnosis: Muscle weakness (generalized)  Abnormal posture  Other symptoms and signs involving the nervous system  Other disturbances of skin sensation    Problem List Patient Active Problem List   Diagnosis Date Noted   Acute on chronic respiratory failure with hypoxia (HCC)    Guillain Barr syndrome (HCC)    Paroxysmal atrial fibrillation (HCC)    Autonomic dysfunction    Severe sepsis [facility])     Collier Salina, OTR/L 03/31/2021, 1:55 PM   [facility] [address] [place], [place], [address] Phone: [phone]   Fax:  [phone]  Name: TREVON STROTHERS MRN: [id number] Date of Birth: 03/21/1946

## 2021-04-07 ENCOUNTER — Other Ambulatory Visit: Payer: Self-pay

## 2021-04-07 ENCOUNTER — Encounter: Payer: Self-pay | Admitting: Occupational Therapy

## 2021-04-07 ENCOUNTER — Ambulatory Visit: Payer: No Typology Code available for payment source | Admitting: Occupational Therapy

## 2021-04-07 DIAGNOSIS — R29818 Other symptoms and signs involving the nervous system: Secondary | ICD-10-CM

## 2021-04-07 DIAGNOSIS — R293 Abnormal posture: Secondary | ICD-10-CM

## 2021-04-07 DIAGNOSIS — R208 Other disturbances of skin sensation: Secondary | ICD-10-CM

## 2021-04-07 DIAGNOSIS — R262 Difficulty in walking, not elsewhere classified: Secondary | ICD-10-CM | POA: Diagnosis not present

## 2021-04-07 DIAGNOSIS — M6281 Muscle weakness (generalized): Secondary | ICD-10-CM

## 2021-04-07 NOTE — Therapy (Signed)
[facility] [address] [place], [place], [address] Phone: [phone]   Fax:  [phone]  Occupational Therapy Treatment  Patient Details  Name: Joseph Mayo MRN: [id number] Date of Birth: Jun 20, 1946 Referring Provider (OT): Sim Boast   Encounter Date: 04/07/2021   OT End of Session - 04/07/21 1416     Visit Number 3    Number of Visits 7    Date for OT Re-Evaluation 05/01/21    Authorization Type [place]    OT Start Time 1147    OT Stop Time 1230    OT Time Calculation (min) 43 min    Activity Tolerance Patient tolerated treatment well    Behavior During Therapy [facility] for tasks assessed/performed             Past Medical History:  Diagnosis Date   Acute on chronic respiratory failure with hypoxia (HCC)    Autonomic dysfunction    Guillain Barr syndrome (HCC)    Paroxysmal atrial fibrillation (HCC)    Severe sepsis (HCC)     History reviewed. No pertinent surgical history.  There were no vitals filed for this visit.   Subjective Assessment - 04/07/21 1407     Subjective  I rode my tractor for several hours.  Heart monitor in place for two weeks    Patient is accompanied by: Family member    Currently in Pain? No/denies                          OT Treatments/Exercises (OP) - 04/07/21 0001       ADLs   Grooming Patient has been sitting in wheelchair to complete grooming tasks at sink.  Patient will try to stand at sink, with wheelchair behind him to brush teeth, comb hair, shave, etc - with goal of standing for greater than 5 min.    UB Dressing Patient reported putting on his shirt standing up at edge of bed with walker in front.  Excellent problem solving!    Bathing Discussed options for bathing with modified independence.  Patient thinking about standing to shower - reminded him he would not have braces on - discussed seated as first step.  Patient reports he has been  getting in and out without physical assistance.      Hand Exercises   Other Hand Exercises Hand strenghening with gripper on second then third setting - BUE, digiflex up to 5 lb x 5 reps BUE.  In standing worked with resistive clothespins to address bilateral hand strength but also more dynamic standing balance                    OT Education - 04/07/21 1415     Education Details suction cup grab bars - orientation options, progress toward goals    Person(s) Educated Patient;Spouse    Methods Explanation    Comprehension Verbalized understanding              OT Short Term Goals - 04/07/21 1217       OT SHORT TERM GOAL #1   Title Patient will complete IADL task while standing with UE support for greater than 5 min    Baseline 4 min    Time 4    Period Weeks    Status On-going               OT Long Term Goals - 04/07/21 1220  OT LONG TERM GOAL #1   Title Patient will complete an HEP designed to improve grip strength BUE    Time 6    Period Weeks    Status Achieved      OT LONG TERM GOAL #2   Title Patient will shower with modified independence    Time 6    Period Weeks    Status On-going      OT LONG TERM GOAL #3   Title Patient will demonstrate awareness of return to driving recommendations    Time 6    Period Weeks    Status On-going      OT LONG TERM GOAL #4   Title Patient will demonstrate sufficient balance in standing to pull up LB clothing using altrnating single UE support    Time 6    Period Weeks    Status Achieved      OT LONG TERM GOAL #5   Title Patient will dmeonstrate 5 lb increase in grip strength bilaterally to aide with opening bottles, packages, etc    Time 6    Period Weeks    Status On-going                   Plan - 04/07/21 1416     Clinical Impression Statement Patient is progressing toward OT goals - improved hand strength, and improving stand tolerance and balance.    OT Occupational Profile and  History Problem Focused Assessment - Including review of records relating to presenting problem    Occupational performance deficits (Please refer to evaluation for details): ADL's;IADL's;Work    Body Structure / Function / Physical Skills ADL;Strength;Balance;Tone;Body mechanics;IADL;Endurance;Sensation;Mobility    Rehab Potential Good    Clinical Decision Making Limited treatment options, no task modification necessary    Comorbidities Affecting Occupational Performance: May have comorbidities impacting occupational performance    Modification or Assistance to Complete Evaluation  No modification of tasks or assist necessary to complete eval    OT Frequency 1x / week    OT Duration 6 weeks   6 sessions   OT Treatment/Interventions Self-care/ADL training;Balance training;Therapeutic activities;Therapeutic exercise;Neuromuscular education;Functional Mobility Training;Patient/family education;DME and/or AE instruction    Plan CHECK GRIP STRENGTH EARLY IN SESSION- standing without AFO's (to determine if he can stand in shower)  Stand balance with decreased relaince on UE's.    Consulted and Agree with Plan of Care Patient;Family member/caregiver             Patient will benefit from skilled therapeutic intervention in order to improve the following deficits and impairments:   Body Structure / Function / Physical Skills: ADL, Strength, Balance, Tone, Body mechanics, IADL, Endurance, Sensation, Mobility       Visit Diagnosis: Muscle weakness (generalized)  Abnormal posture  Other symptoms and signs involving the nervous system  Other disturbances of skin sensation    Problem List Patient Active Problem List   Diagnosis Date Noted   Acute on chronic respiratory failure with hypoxia (HCC)    Guillain Barr syndrome (HCC)    Paroxysmal atrial fibrillation (HCC)    Autonomic dysfunction    Severe sepsis [facility])     Collier Salina 04/07/2021, 2:20 PM  [facility] [address] [place], [place], [address] Phone: [phone]   Fax:  [phone]  Name: Joseph Mayo MRN: [id number] Date of Birth: 1946/06/08

## 2021-04-14 ENCOUNTER — Ambulatory Visit: Payer: No Typology Code available for payment source | Admitting: Occupational Therapy

## 2021-04-14 ENCOUNTER — Ambulatory Visit: Payer: No Typology Code available for payment source | Admitting: Physical Therapy

## 2021-04-14 ENCOUNTER — Encounter: Payer: Self-pay | Admitting: Physical Therapy

## 2021-04-14 ENCOUNTER — Encounter: Payer: Self-pay | Admitting: Occupational Therapy

## 2021-04-14 ENCOUNTER — Other Ambulatory Visit: Payer: Self-pay

## 2021-04-14 VITALS — BP 125/66 | HR 54

## 2021-04-14 DIAGNOSIS — R29818 Other symptoms and signs involving the nervous system: Secondary | ICD-10-CM

## 2021-04-14 DIAGNOSIS — R293 Abnormal posture: Secondary | ICD-10-CM

## 2021-04-14 DIAGNOSIS — M6281 Muscle weakness (generalized): Secondary | ICD-10-CM

## 2021-04-14 DIAGNOSIS — R208 Other disturbances of skin sensation: Secondary | ICD-10-CM

## 2021-04-14 DIAGNOSIS — R262 Difficulty in walking, not elsewhere classified: Secondary | ICD-10-CM | POA: Diagnosis not present

## 2021-04-14 NOTE — Therapy (Signed)
[facility] [address] [place], [place], [address] Phone: [phone]   Fax:  [phone]  Physical Therapy Treatment  Patient Details  Name: Joseph Mayo MRN: [id number] Date of Birth: [birth year] Referring Provider (PT): Greer Pickerel A   Encounter Date: 04/14/2021   PT End of Session - 04/14/21 0942     Visit Number 32    Number of Visits 12    Date for PT Re-Evaluation 06/14/21    Authorization Type VA    Authorization Time Period 15 visits through 04/28/21; 15 visits from 03/24/21-05/22/21    Authorization - Visit Number 1    Authorization - Number of Visits 15    PT Start Time 0934    PT Stop Time 1015    PT Time Calculation (min) 41 min    Equipment Utilized During Treatment Gait belt    Activity Tolerance Patient tolerated treatment well    Behavior During Therapy [facility] for tasks assessed/performed             Past Medical History:  Diagnosis Date   Acute on chronic respiratory failure with hypoxia (HCC)    Autonomic dysfunction    Guillain Barr syndrome ([place])    Paroxysmal atrial fibrillation ([place])    Severe sepsis ([place])     History reviewed. No pertinent surgical history.  Vitals:   04/14/21 0943 04/14/21 1000  BP: 136/68 125/66  Pulse: 61 (!) 54     Subjective Assessment - 04/14/21 0938     Subjective No new complaints. No pain. Did have a fall last Wednesday. Was standing next to the bed changing his shirt, lost his balance and when he went to grab onto the bed he was too close to the edge at the foot of the bed, and slid down to the floor. Was able to get himself up, daughter was in the room. Denies any injuries.    Patient is accompained by: Family member    Pertinent History GBS, a fib, hx of covid    Limitations Standing;Walking;Sitting    How long can you stand comfortably? about 1-2 minutes at the countertop with daughter assisting.    Patient Stated Goals wants to get up and  walk.    Currently in Pain? No/denies                      [facility] Adult PT Treatment/Exercise - 04/14/21 0943       Transfers   Transfers Sit to Stand;Stand to Sit    Sit to Stand 5: Supervision;With upper extremity assist;From bed;From chair/3-in-1    Stand to Sit 5: Supervision;With upper extremity assist;To bed;To chair/3-in-1      Ambulation/Gait   Ambulation/Gait Yes    Ambulation/Gait Assistance 5: Supervision    Ambulation/Gait Assistance Details around clinic with session    Assistive device Rolling walker;Other (Comment)   bil AFO's   Gait Pattern Step-through pattern;Decreased stride length;Decreased step length - right;Decreased step length - left;Decreased dorsiflexion - right;Decreased dorsiflexion - left;Right steppage;Left steppage;Narrow base of support    Ambulation Surface Level;Indoor      High Level Balance   High Level Balance Activities Side stepping;Marching forwards;Backward walking    High Level Balance Comments on blue mat in parallel bars with UE support/bil AFO's: 4 laps each with cues on posture/forward gaze and ex form/technique.      Knee/Hip Exercises: Aerobic   Other Aerobic [place] UE/LE's (seat 20 and arms 9) on level 3.5  x 8 minutes with goal >/= 60 steps per minute for strengthening and activity tolerance                 Balance Exercises - 04/14/21 0959       Balance Exercises: Standing   Standing Eyes Opened Narrow base of support (BOS);Foam/compliant surface;3 reps;30 secs;Limitations    Standing Eyes Opened Limitations on airex with no UE support for 30 sec's x 3 reps, min guard assist for balance with cues on posture and weight shifting.    Standing Eyes Closed Foam/compliant surface;Other reps (comment);30 secs;Limitations    Standing Eyes Closed Limitations on airex with fingertip support: feet hip width apart- EC 30 seconds x 3 reps, then feet wide apart- for EC head movements left<>right, up<>down for ~10 reps each. min  guard to min assist for balance with cues on posture and weight shifitng to assist with balance.                 PT Short Term Goals - 03/29/21 1327       PT SHORT TERM GOAL #1   Title Pt will perform 4 steps with B handrails and step to pattern with min guard in order to demo improved functional mobility. ALL STGS DUE 04/26/21    Baseline needs min guard    Time 4    Period Weeks    Status New    Target Date 04/26/21      PT SHORT TERM GOAL #2   Title Pt will improve gait speed to at least 1.5 ft/sec with RW and B AFOs in order to demo improved gait efficiency.    Baseline 1.31 ft/sec with RW    Time 4    Period Weeks    Status New      PT SHORT TERM GOAL #3   Title Pt will decr TUG time to 20 seconds or less with RW and B AFOs in order to demo decr fall risk.    Baseline 24.94 seconds    Time 4    Period Weeks    Status New      PT SHORT TERM GOAL #4   Title Pt will undergo further assessment of 5x sit <> stand with LTG written as appropriate in order to demo improved balance and functional strength.    Baseline not yet assessed.    Time 4    Period Weeks    Status New      PT SHORT TERM GOAL #5   Title Pt will ambulate at least 300' outdoors with RW and B AFOs with supervision in order to demo improved community mobility.    Baseline needs min assist    Time 4    Period Weeks    Status New               PT Long Term Goals - 03/29/21 0908       PT LONG TERM GOAL #1   Title Pt and pt's caregiver will be independent with final HEP in order to build upon functional gains made in therapy. ALL LTGS DUE 05/24/21    Baseline 03/19/21: met with current program- will continue to benefit from updates    Time 8   due to delay in scheduling   Period Weeks    Status New    Target Date 05/24/21      PT LONG TERM GOAL #2   Title Pt will improve BERG score to at least a 30/56 in  order to demo decr fall risk.    Baseline 23/56    Time 8    Period Weeks    Status  New      PT LONG TERM GOAL #3   Title Pt will ambulate at least 500' outdoors with RW and B AFOs and perform a curb with supervision in order to demo improved community mobility.    Time 8    Period Weeks    Status New      PT LONG TERM GOAL #4   Title 5x sit <> stand goal written as appropriate in order to demo improved functional BLE strength.    Baseline has not yet been assessed.    Time 8    Period Weeks    Status New      PT LONG TERM GOAL #5   Title Pt will improve gait speed to at least 1.8 ft/sec with RW and B AFOs in order to demo improved gait efficiency.    Baseline 1.31 ft/sec with RW    Time 8    Period Weeks    Status New                   Plan - 04/14/21 1839     Clinical Impression Statement Today's skilled session continued to focus on strengthening and balance training on compliant surfaces. No issues noted or reported in session. VSS with session today with no dizziness/lightheadedness reported or noted. The pt is progressing toward goals and should benefit from continued PT to progress toward unmet goals.    Personal Factors and Comorbidities Comorbidity 3+;Past/Current Experience;Fitness   pt very active prior to diagnosis   Comorbidities GBS, a fib, hx of covid.    Examination-Activity Limitations Bathing;Bed Mobility;Caring for [employer];Locomotion Level;Dressing;Hygiene/Grooming;Transfers;Stand;Toileting    Examination-Participation Restrictions Cleaning;Community Activity;Yard Work;Meal Prep;Shop;Laundry;Driving;Occupation   exercise, cycling   Stability/Clinical Decision Making Evolving/Moderate complexity    Rehab Potential Good    PT Frequency 2x / week   1-2x a week   PT Duration 12 weeks    PT Treatment/Interventions Manual techniques;Therapeutic exercise;Gait training;Neuromuscular re-education;Aquatic Therapy;Orthotic Fit/Training    PT Next Visit Plan add balance components to HEP.  continue with strengthening, standing  balance with decreased UE support. SciFit for strengthening and aerobic activity. Gait over/around objects, gait distance and gait on outdoor surfaces    PT Home Exercise Plan [email or web address]    Consulted and Agree with Plan of Care Patient;Family member/caregiver    Family Member Consulted pt's wife and daughter.             Patient will benefit from skilled therapeutic intervention in order to improve the following deficits and impairments:  Decreased activity tolerance, Decreased coordination, Decreased balance, Decreased endurance, Decreased range of motion, Decreased strength, Difficulty walking, Impaired sensation, Postural dysfunction  Visit Diagnosis: Muscle weakness (generalized)  Other symptoms and signs involving the nervous system  Difficulty in walking, not elsewhere classified  Other disturbances of skin sensation     Problem List Patient Active Problem List   Diagnosis Date Noted   Acute on chronic respiratory failure with hypoxia ([place])    Guillain Barr syndrome ([place])    Paroxysmal atrial fibrillation ([place])    Autonomic dysfunction    Severe sepsis ([place])    Willow Ora, PTA, [facility] [address] [place], [place] [address] [phone] 04/14/21, 9:36 PM   Name: Joseph Mayo MRN: [id number] Date of Birth: Apr 15, 1946

## 2021-04-14 NOTE — Therapy (Signed)
[facility] [address] [place], [place], [address] Phone: [phone]   Fax:  [phone]  Occupational Therapy Treatment and Discharge Summary  Patient Details  Name: Joseph Mayo MRN: [id number] Date of Birth: [birth year] Referring Provider (OT): Caprice Beaver   Encounter Date: 04/14/2021   OT End of Session - 04/14/21 1228     Visit Number 4    Number of Visits 7    Date for OT Re-Evaluation 05/01/21    Authorization Type [place]    OT Start Time 1145    OT Stop Time 1215    OT Time Calculation (min) 30 min    Activity Tolerance Patient tolerated treatment well    Behavior During Therapy [facility] for tasks assessed/performed             Past Medical History:  Diagnosis Date   Acute on chronic respiratory failure with hypoxia (HCC)    Autonomic dysfunction    Guillain Barr syndrome ([place])    Paroxysmal atrial fibrillation ([place])    Severe sepsis ([place])     History reviewed. No pertinent surgical history.  There were no vitals filed for this visit.   Subjective Assessment - 04/14/21 1217     Subjective  I took a shower standing up    Patient is accompanied by: Family member    Currently in Pain? No/denies                [facility] OT Assessment - 04/14/21 0001       Hand Function   Right Hand Gross Grasp Impaired    Right Hand Grip (lbs) 57.1    Left Hand Gross Grasp Impaired    Left Hand Grip (lbs) 53.3                      OT Treatments/Exercises (OP) - 04/14/21 1219       ADLs   Bathing Patient showered without assistance, and stood to shower.  He used grab bars and wore shower shoes.    Driving Patient has been practicing driving - on tractor, on lawn mower,  Patient instructed to drive his truck in graduated fashion - has been trying on his large property.  Since last session - he successfully drove to the grocery store.  After walking entire store though he was unable to  fully engage clutch to start the car.  Patient feels sure without AFO he could have engaged clutch.  Also discussed moving truck seat forward.  If these issues do not resolve as expected,  the patient is aware of the option for hand controls.    ADL Comments Patient has met OT goals and is agreeable to OT discharge.  Patient's wife and daughter present today and expressed no concerns relating to discharge.                    OT Education - 04/14/21 1228     Education Details driving options, progress toward goals - discharge    Person(s) Educated Patient;Spouse;Child(ren)    Methods Explanation    Comprehension Verbalized understanding              OT Short Term Goals - 04/14/21 1156       OT SHORT TERM GOAL #1   Title Patient will complete IADL task while standing with UE support for greater than 5 min    Baseline 4 min    Time 4  Period Weeks    Status Achieved               OT Long Term Goals - 04/14/21 1157       OT LONG TERM GOAL #1   Title Patient will complete an HEP designed to improve grip strength BUE    Time 6    Period Weeks    Status Achieved      OT LONG TERM GOAL #2   Title Patient will shower with modified independence    Time 6    Period Weeks    Status Achieved      OT LONG TERM GOAL #3   Title Patient will demonstrate awareness of return to driving recommendations    Time 6    Period Weeks    Status Achieved      OT LONG TERM GOAL #4   Title Patient will demonstrate sufficient balance in standing to pull up LB clothing using altrnating single UE support    Time 6    Period Weeks    Status Achieved      OT LONG TERM GOAL #5   Title Patient will dmeonstrate 5 lb increase in grip strength bilaterally to aide with opening bottles, packages, etc    Time 6    Period Weeks    Status Partially Met                   Plan - 04/14/21 1228     Clinical Impression Statement Patient has made excellent progress and is  agreeable to early OT discharge    OT Occupational Profile and History Problem Focused Assessment - Including review of records relating to presenting problem    Occupational performance deficits (Please refer to evaluation for details): ADL's;IADL's;Work    Body Structure / Function / Physical Skills ADL;Strength;Balance;Tone;Body mechanics;IADL;Endurance;Sensation;Mobility    Rehab Potential Good    Clinical Decision Making Limited treatment options, no task modification necessary    Comorbidities Affecting Occupational Performance: May have comorbidities impacting occupational performance    Modification or Assistance to Complete Evaluation  No modification of tasks or assist necessary to complete eval    OT Frequency 1x / week    OT Duration 6 weeks   6 sessions   OT Treatment/Interventions Self-care/ADL training;Balance training;Therapeutic activities;Therapeutic exercise;Neuromuscular education;Functional Mobility Training;Patient/family education;DME and/or AE instruction    Plan discharge    OT Home Exercise Plan putty exercises    Consulted and Agree with Plan of Care Patient;Family member/caregiver             Patient will benefit from skilled therapeutic intervention in order to improve the following deficits and impairments:   Body Structure / Function / Physical Skills: ADL, Strength, Balance, Tone, Body mechanics, IADL, Endurance, Sensation, Mobility       Visit Diagnosis: Muscle weakness (generalized)  Other symptoms and signs involving the nervous system  Other disturbances of skin sensation  Abnormal posture    Problem List Patient Active Problem List   Diagnosis Date Noted   Acute on chronic respiratory failure with hypoxia (HCC)    Guillain Barr syndrome (HCC)    Paroxysmal atrial fibrillation (HCC)    Autonomic dysfunction    Severe sepsis ([place])   OCCUPATIONAL THERAPY DISCHARGE SUMMARY  Visits from Start of Care: 4  Current functional level  related to goals / functional outcomes: Modified independent with ADL/IADL   Remaining deficits: LE Weakness, decreased (but improved) grip strength   Education / Equipment: Graduated  driving, driving compensations, ADL compensations, standing Home Activities Program   Patient agrees to discharge. Patient goals were met. Patient is being discharged due to meeting the stated rehab goals.Mariah Milling, OTR/L 04/14/2021, 1:18 PM  [facility] [address] [place], [place], [address] Phone: [phone]   Fax:  [phone]  Name: Joseph Mayo MRN: [id number] Date of Birth: 10-Oct-1945

## 2021-04-21 ENCOUNTER — Ambulatory Visit: Payer: No Typology Code available for payment source | Admitting: Physical Therapy

## 2021-04-21 ENCOUNTER — Encounter: Payer: No Typology Code available for payment source | Admitting: Occupational Therapy

## 2021-04-21 ENCOUNTER — Encounter: Payer: Self-pay | Admitting: Physical Therapy

## 2021-04-21 ENCOUNTER — Other Ambulatory Visit: Payer: Self-pay

## 2021-04-21 DIAGNOSIS — R262 Difficulty in walking, not elsewhere classified: Secondary | ICD-10-CM | POA: Diagnosis not present

## 2021-04-21 DIAGNOSIS — M6281 Muscle weakness (generalized): Secondary | ICD-10-CM

## 2021-04-21 DIAGNOSIS — R29818 Other symptoms and signs involving the nervous system: Secondary | ICD-10-CM

## 2021-04-21 NOTE — Therapy (Signed)
[facility] [address] [place], [place], [address] Phone: [phone]   Fax:  [phone]  Physical Therapy Treatment  Patient Details  Name: Joseph Mayo MRN: [id number] Date of Birth: Apr 26, 1946 Referring Provider (PT): Greer Pickerel A   Encounter Date: 04/21/2021   PT End of Session - 04/21/21 1333     Visit Number 33    Number of Visits 30    Date for PT Re-Evaluation 06/14/21    Authorization Type VA    Authorization Time Period 15 visits through 04/28/21; 15 visits from 03/24/21-07/22/21    Authorization - Visit Number 2    Authorization - Number of Visits 15    PT Start Time 1232    PT Stop Time 1316    PT Time Calculation (min) 44 min    Equipment Utilized During Treatment Gait belt    Activity Tolerance Patient tolerated treatment well    Behavior During Therapy WFL for tasks assessed/performed             Past Medical History:  Diagnosis Date   Acute on chronic respiratory failure with hypoxia (HCC)    Autonomic dysfunction    Guillain Barr syndrome ([place])    Paroxysmal atrial fibrillation ([place])    Severe sepsis ([place])     History reviewed. No pertinent surgical history.  There were no vitals filed for this visit.   Subjective Assessment - 04/21/21 1237     Subjective No falls. Finally got his braces! Did not have a lateral wedge added for ankle support, orthotist at [facility] stated it was due to hip weakness.    Patient is accompained by: Family member    Pertinent History GBS, a fib, hx of covid    Limitations Standing;Walking;Sitting    How long can you stand comfortably? about 1-2 minutes at the countertop with daughter assisting.    Patient Stated Goals wants to get up and walk.    Currently in Pain? No/denies                               [facility] Adult PT Treatment/Exercise - 04/21/21 1310       Transfers   Transfers Sit to Stand;Stand to Sit    Sit to  Stand 5: Supervision;With upper extremity assist;From bed;From chair/3-in-1    Stand to Sit 5: Supervision;With upper extremity assist;To bed;To chair/3-in-1      Ambulation/Gait   Ambulation/Gait Yes    Ambulation/Gait Assistance 5: Supervision    Ambulation/Gait Assistance Details around clinic with session with B AFOs    Assistive device Rolling walker;Other (Comment)   B AFOs   Gait Pattern Step-through pattern;Decreased stride length;Decreased step length - right;Decreased step length - left;Decreased dorsiflexion - right;Decreased dorsiflexion - left;Right steppage;Left steppage;Narrow base of support    Ambulation Surface Level;Indoor      Knee/Hip Exercises: Aerobic   Other Aerobic Scift UE/LE's (seat 20 and arms 9) on level 3.5 x 6 minutes with goal >/= 60 steps per minute for strengthening and activity tolerance              Access Code: [email or web address] URL: [email or web address] Date: 04/21/2021 Prepared by: Janann August  Updated HEP for balance/BLE strengthening and provided new handout. See MedBridge for more details.   Exercises Mini Squats with Walker and Chair - 1 x daily - 5 x weekly - 1-2 sets - 10 reps - cues for proper technique and  glute activation in standing  Single Leg Bridge - 1 x daily - 5 x weekly - 2 sets - 5 reps Clamshell with Resistance - 1 x daily - 5 x weekly - 2 sets - 10 reps - with green tband resistance.  Standing March with Counter Support - 1 x daily - 5 x weekly - 2 sets - 10 reps - with BUE support at [employer] in Corner with Eyes Closed - 1 x daily - 5 x weekly - 3 sets - 20 hold - with wide BOS and using fingertip support  Standing with Head Rotation - 1 x daily - 5 x weekly - 2 sets - 10 reps - with fingertip support in the corner with eyes open x10 reps head turns, x10 reps head nods        PT Education - 04/21/21 1331     Education Details updated HEP, scheduled pt for another clinic this week as well as pool  appt for week of 05/03/21    Person(s) Educated Patient;Spouse;Child(ren)    Methods Explanation;Demonstration;Handout;Verbal cues    Comprehension Verbalized understanding;Returned demonstration              PT Short Term Goals - 03/29/21 1327       PT SHORT TERM GOAL #1   Title Pt will perform 4 steps with B handrails and step to pattern with min guard in order to demo improved functional mobility. ALL STGS DUE 04/26/21    Baseline needs min guard    Time 4    Period Weeks    Status New    Target Date 04/26/21      PT SHORT TERM GOAL #2   Title Pt will improve gait speed to at least 1.5 ft/sec with RW and B AFOs in order to demo improved gait efficiency.    Baseline 1.31 ft/sec with RW    Time 4    Period Weeks    Status New      PT SHORT TERM GOAL #3   Title Pt will decr TUG time to 20 seconds or less with RW and B AFOs in order to demo decr fall risk.    Baseline 24.94 seconds    Time 4    Period Weeks    Status New      PT SHORT TERM GOAL #4   Title Pt will undergo further assessment of 5x sit <> stand with LTG written as appropriate in order to demo improved balance and functional strength.    Baseline not yet assessed.    Time 4    Period Weeks    Status New      PT SHORT TERM GOAL #5   Title Pt will ambulate at least 300' outdoors with RW and B AFOs with supervision in order to demo improved community mobility.    Baseline needs min assist    Time 4    Period Weeks    Status New               PT Long Term Goals - 03/29/21 0908       PT LONG TERM GOAL #1   Title Pt and pt's caregiver will be independent with final HEP in order to build upon functional gains made in therapy. ALL LTGS DUE 05/24/21    Baseline 03/19/21: met with current program- will continue to benefit from updates    Time 8   due to delay in scheduling   Period Weeks  Status New    Target Date 05/24/21      PT LONG TERM GOAL #2   Title Pt will improve BERG score to at least a  30/56 in order to demo decr fall risk.    Baseline 23/56    Time 8    Period Weeks    Status New      PT LONG TERM GOAL #3   Title Pt will ambulate at least 500' outdoors with RW and B AFOs and perform a curb with supervision in order to demo improved community mobility.    Time 8    Period Weeks    Status New      PT LONG TERM GOAL #4   Title 5x sit <> stand goal written as appropriate in order to demo improved functional BLE strength.    Baseline has not yet been assessed.    Time 8    Period Weeks    Status New      PT LONG TERM GOAL #5   Title Pt will improve gait speed to at least 1.8 ft/sec with RW and B AFOs in order to demo improved gait efficiency.    Baseline 1.31 ft/sec with RW    Time 8    Period Weeks    Status New                   Plan - 04/21/21 1338     Clinical Impression Statement Today's skilled session focused on BLE strengthening and balance training with decr UE support. Updated pt's HEP to progress strengthening and add standing in the corner. Pt to perform standing balance with family supervision. Pt tolerated session well, will continue to progress towards LTGs.    Personal Factors and Comorbidities Comorbidity 3+;Past/Current Experience;Fitness   pt very active prior to diagnosis   Comorbidities GBS, a fib, hx of covid.    Examination-Activity Limitations Bathing;Bed Mobility;Caring for [employer];Locomotion Level;Dressing;Hygiene/Grooming;Transfers;Stand;Toileting    Examination-Participation Restrictions Cleaning;Community Activity;Yard Work;Meal Prep;Shop;Laundry;Driving;Occupation   exercise, cycling   Stability/Clinical Decision Making Evolving/Moderate complexity    Rehab Potential Good    PT Frequency 2x / week   1-2x a week   PT Duration 12 weeks    PT Treatment/Interventions Manual techniques;Therapeutic exercise;Gait training;Neuromuscular re-education;Aquatic Therapy;Orthotic Fit/Training    PT Next Visit Plan check  STGS. continue with strengthening, standing balance with decreased UE support with eyes closed/head motions. SciFit for strengthening and aerobic activity. Gait over/around objects, gait distance and gait on outdoor surfaces    PT Home Exercise Plan [email or web address]    Consulted and Agree with Plan of Care Patient;Family member/caregiver    Family Member Consulted pt's wife and daughter.             Patient will benefit from skilled therapeutic intervention in order to improve the following deficits and impairments:  Decreased activity tolerance, Decreased coordination, Decreased balance, Decreased endurance, Decreased range of motion, Decreased strength, Difficulty walking, Impaired sensation, Postural dysfunction  Visit Diagnosis: Muscle weakness (generalized)  Other symptoms and signs involving the nervous system  Difficulty in walking, not elsewhere classified     Problem List Patient Active Problem List   Diagnosis Date Noted   Acute on chronic respiratory failure with hypoxia (HCC)    Guillain Barr syndrome ([place])    Paroxysmal atrial fibrillation ([place])    Autonomic dysfunction    Severe sepsis ([place])     Arliss Journey, PT, DPT  04/21/2021, 1:42 PM  [facility]  [facility] [address] [place], [place], [address] Phone: [phone]   Fax:  [phone]  Name: Joseph Mayo MRN: [id number] Date of Birth: 1946-04-18

## 2021-04-21 NOTE — Patient Instructions (Signed)
Access Code: [email or web address] URL: [email or web address] Date: 04/21/2021 Prepared by: Sherlie Ban  Exercises Mini Squats with Dan Humphreys and Chair - 1 x daily - 5 x weekly - 1-2 sets - 10 reps Single Leg Bridge - 1 x daily - 5 x weekly - 2 sets - 5 reps Clamshell with Resistance - 1 x daily - 5 x weekly - 2 sets - 10 reps Standing March with Counter Support - 1 x daily - 5 x weekly - 2 sets - 10 reps Standing Balance in Corner with Eyes Closed - 1 x daily - 5 x weekly - 3 sets - 20 hold Standing with Head Rotation - 1 x daily - 5 x weekly - 2 sets - 10 reps

## 2021-04-23 ENCOUNTER — Encounter: Payer: Self-pay | Admitting: Physical Therapy

## 2021-04-23 ENCOUNTER — Other Ambulatory Visit: Payer: Self-pay

## 2021-04-23 ENCOUNTER — Ambulatory Visit: Payer: No Typology Code available for payment source | Attending: Family Medicine | Admitting: Physical Therapy

## 2021-04-23 DIAGNOSIS — M6281 Muscle weakness (generalized): Secondary | ICD-10-CM | POA: Insufficient documentation

## 2021-04-23 DIAGNOSIS — R208 Other disturbances of skin sensation: Secondary | ICD-10-CM | POA: Insufficient documentation

## 2021-04-23 DIAGNOSIS — R262 Difficulty in walking, not elsewhere classified: Secondary | ICD-10-CM | POA: Diagnosis present

## 2021-04-23 DIAGNOSIS — R29818 Other symptoms and signs involving the nervous system: Secondary | ICD-10-CM | POA: Insufficient documentation

## 2021-04-23 DIAGNOSIS — R293 Abnormal posture: Secondary | ICD-10-CM | POA: Diagnosis present

## 2021-04-23 NOTE — Therapy (Signed)
[facility] [address] [place], [place], [address] Phone: [phone]   Fax:  [phone]  Physical Therapy Treatment  Patient Details  Name: Joseph Mayo MRN: [id number] Date of Birth: [birth year] Referring Provider (PT): Greer Pickerel A   Encounter Date: 04/23/2021   PT End of Session - 04/23/21 1507     Visit Number 34    Number of Visits 24    Date for PT Re-Evaluation 06/14/21    Authorization Type [facility]    Authorization Time Period 15 visits through 04/28/21; 15 visits from 03/24/21-07/22/21    Authorization - Visit Number 3    Authorization - Number of Visits 15    PT Start Time 9024    PT Stop Time 1444    PT Time Calculation (min) 41 min    Equipment Utilized During Treatment Gait belt    Activity Tolerance Patient tolerated treatment well    Behavior During Therapy WFL for tasks assessed/performed             Past Medical History:  Diagnosis Date   Acute on chronic respiratory failure with hypoxia (HCC)    Autonomic dysfunction    Guillain Barr syndrome ([place])    Paroxysmal atrial fibrillation ([place])    Severe sepsis ([place])     History reviewed. No pertinent surgical history.  There were no vitals filed for this visit.   Subjective Assessment - 04/23/21 1405     Subjective No falls, no changes.    Patient is accompained by: Family member    Pertinent History GBS, a fib, hx of covid    Limitations Standing;Walking;Sitting    How long can you stand comfortably? about 1-2 minutes at the countertop with daughter assisting.    Patient Stated Goals wants to get up and walk.    Currently in Pain? No/denies                [facility] PT Assessment - 04/23/21 1407       Timed Up and Go Test   Normal TUG (seconds) 18.78   with RW and B AFOs                          [facility] Adult PT Treatment/Exercise - 04/23/21 1407       Transfers   Transfers Sit to Stand;Stand to Sit     Sit to Stand 5: Supervision;With upper extremity assist;From bed;From chair/3-in-1    Stand to Sit 5: Supervision;With upper extremity assist;To bed;To chair/3-in-1    Comments plus an additional from 24.5" mat table: cues for incr forward lean to perform without UE support, min guard for balance x4 reps, cues for posture once in standing      Ambulation/Gait   Ambulation/Gait Yes    Ambulation/Gait Assistance 5: Supervision    Ambulation/Gait Assistance Details around clinic with activities    Assistive device Rolling walker;Other (Comment)   B AFOs   Gait Pattern Step-through pattern;Decreased stride length;Decreased step length - right;Decreased step length - left;Decreased dorsiflexion - right;Decreased dorsiflexion - left;Right steppage;Left steppage;Narrow base of support    Ambulation Surface Level;Indoor    Gait velocity 18.59 seconds = 1.76 ft/sec                 Balance Exercises - 04/23/21 1422       Balance Exercises: Standing   Standing Eyes Opened Wide (BOA)    Standing Eyes Opened Limitations on blue mat  in // bars with wide BOS: alternating UE lifts x5 reps B, trunk rotations reaching across body to touch object x5 reps B, static standing x30 seconds, intermittent taps to bars for balance with min guard/min A    Standing Eyes Closed Wide (BOA);Solid surface    Standing Eyes Closed Limitations on level ground in // bars: 3 x 30 seconds with cues for posture and intermittent taps to bars, with fingertip support: x10 reps head turns, x10 reps head nods, close min guard for balance    Sidestepping Foam/compliant support;Limitations    Sidestepping Limitations on blue compliant mat down and back x2 reps with fingertip support    Step Over Hurdles / Cones over 2 black 4" obstacles, 2 smaller profile orange hurdles - first with step to pattern forwards x2 reps, then reciprocal pattern down and back x2 reps with cues for foot clearance and incr hip/knee flexion, lateral  stepping with step to pattern down and back x2 reps    Other Standing Exercises Comments in // bars: forward marching and then walking backwards, cues for posture with fingertip support as needed               PT Education - 04/23/21 1506     Education Details progress towards goals    Person(s) Educated Patient;Spouse    Methods Explanation    Comprehension Verbalized understanding              PT Short Term Goals - 04/23/21 1411       PT SHORT TERM GOAL #1   Title Pt will perform 4 steps with B handrails and step to pattern with min guard in order to demo improved functional mobility. ALL STGS DUE 04/26/21    Baseline needs min guard    Time 4    Period Weeks    Status New    Target Date 04/26/21      PT SHORT TERM GOAL #2   Title Pt will improve gait speed to at least 1.5 ft/sec with RW and B AFOs in order to demo improved gait efficiency.    Baseline 1.31 ft/sec with RW; 1.74 ft/sec    Time 4    Period Weeks    Status Achieved      PT SHORT TERM GOAL #3   Title Pt will decr TUG time to 20 seconds or less with RW and B AFOs in order to demo decr fall risk.    Baseline 24.94 seconds; 18.78 seconds on 04/23/21    Time 4    Period Weeks    Status Achieved      PT SHORT TERM GOAL #4   Title Pt will undergo further assessment of 5x sit <> stand with LTG written as appropriate in order to demo improved balance and functional strength.    Baseline not yet assessed.    Time 4    Period Weeks    Status New      PT SHORT TERM GOAL #5   Title Pt will ambulate at least 300' outdoors with RW and B AFOs with supervision in order to demo improved community mobility.    Baseline needs min assist    Time 4    Period Weeks    Status New               PT Long Term Goals - 03/29/21 0908       PT LONG TERM GOAL #1   Title Pt and pt's caregiver will be  independent with final HEP in order to build upon functional gains made in therapy. ALL LTGS DUE 05/24/21     Baseline 03/19/21: met with current program- will continue to benefit from updates    Time 8   due to delay in scheduling   Period Weeks    Status New    Target Date 05/24/21      PT LONG TERM GOAL #2   Title Pt will improve BERG score to at least a 30/56 in order to demo decr fall risk.    Baseline 23/56    Time 8    Period Weeks    Status New      PT LONG TERM GOAL #3   Title Pt will ambulate at least 500' outdoors with RW and B AFOs and perform a curb with supervision in order to demo improved community mobility.    Time 8    Period Weeks    Status New      PT LONG TERM GOAL #4   Title 5x sit <> stand goal written as appropriate in order to demo improved functional BLE strength.    Baseline has not yet been assessed.    Time 8    Period Weeks    Status New      PT LONG TERM GOAL #5   Title Pt will improve gait speed to at least 1.8 ft/sec with RW and B AFOs in order to demo improved gait efficiency.    Baseline 1.31 ft/sec with RW    Time 8    Period Weeks    Status New                   Plan - 04/23/21 1508     Clinical Impression Statement Began to check STGs today with pt achieving STG #2 and #3. Pt improved gait speed with RW and B AFOs to 1.74 ft/sec (previously 1.31 ft/sec). Pt improved TUG time to 18.78 seconds (previously 24.94 seconds). Remainder of session focused on standing balance strategies working on compliant surfaces and on level ground working on [employer] UE support. Pt most challenged with balance with eyes closed, needing close min guard for safety. Will continue to progrss towards LTGs.    Personal Factors and Comorbidities Comorbidity 3+;Past/Current Experience;Fitness   pt very active prior to diagnosis   Comorbidities GBS, a fib, hx of covid.    Examination-Activity Limitations Bathing;Bed Mobility;Caring for [employer];Locomotion Level;Dressing;Hygiene/Grooming;Transfers;Stand;Toileting    Examination-Participation Restrictions  Cleaning;Community Activity;Yard Work;Meal Prep;Shop;Laundry;Driving;Occupation   exercise, cycling   Stability/Clinical Decision Making Evolving/Moderate complexity    Rehab Potential Good    PT Frequency 2x / week   1-2x a week   PT Duration 12 weeks    PT Treatment/Interventions Manual techniques;Therapeutic exercise;Gait training;Neuromuscular re-education;Aquatic Therapy;Orthotic Fit/Training    PT Next Visit Plan finish checking STGS. continue with strengthening, standing balance with decreased UE support with eyes closed/head motions. SciFit for strengthening and aerobic activity. Gait over/around objects, gait distance and gait on outdoor surfaces    PT Home Exercise Plan [id number]    Consulted and Agree with Plan of Care Patient;Family member/caregiver    Family Member Consulted pt's wife and daughter.             Patient will benefit from skilled therapeutic intervention in order to improve the following deficits and impairments:  Decreased activity tolerance, Decreased coordination, Decreased balance, Decreased endurance, Decreased range of motion, Decreased strength, Difficulty walking, Impaired sensation, Postural dysfunction  Visit Diagnosis:  Muscle weakness (generalized)  Other symptoms and signs involving the nervous system  Difficulty in walking, not elsewhere classified     Problem List Patient Active Problem List   Diagnosis Date Noted   Acute on chronic respiratory failure with hypoxia (HCC)    Guillain Barr syndrome ([place])    Paroxysmal atrial fibrillation ([place])    Autonomic dysfunction    Severe sepsis ([place])     Arliss Journey, PT, DPT  04/23/2021, 3:11 PM  [facility] [address] [place], [place], [address] Phone: [phone]   Fax:  [phone]  Name: Joseph Mayo MRN: [id number] Date of Birth: [birth year]

## 2021-04-28 ENCOUNTER — Ambulatory Visit: Payer: No Typology Code available for payment source | Admitting: Physical Therapy

## 2021-04-28 ENCOUNTER — Encounter: Payer: Self-pay | Admitting: Physical Therapy

## 2021-04-28 ENCOUNTER — Other Ambulatory Visit: Payer: Self-pay

## 2021-04-28 DIAGNOSIS — R29818 Other symptoms and signs involving the nervous system: Secondary | ICD-10-CM

## 2021-04-28 DIAGNOSIS — M6281 Muscle weakness (generalized): Secondary | ICD-10-CM | POA: Diagnosis not present

## 2021-04-28 DIAGNOSIS — R262 Difficulty in walking, not elsewhere classified: Secondary | ICD-10-CM

## 2021-04-28 NOTE — Therapy (Addendum)
[facility] [address] [place], [place], [address] Phone: [phone]   Fax:  [phone]  Physical Therapy Treatment  Patient Details  Name: Joseph Mayo MRN: [id number] Date of Birth: 1946-07-27 Referring Provider (PT): Greer Pickerel A   Encounter Date: 04/28/2021   PT End of Session - 04/28/21 1314     Visit Number 35    Number of Visits 57    Date for PT Re-Evaluation 06/14/21    Authorization Type VA    Authorization Time Period 15 visits through 04/28/21; 15 visits from 03/24/21-07/22/21    Authorization - Visit Number 4    Authorization - Number of Visits 15    PT Start Time 1232    PT Stop Time 1312    PT Time Calculation (min) 40 min    Equipment Utilized During Treatment Gait belt    Activity Tolerance Patient tolerated treatment well    Behavior During Therapy WFL for tasks assessed/performed             Past Medical History:  Diagnosis Date   Acute on chronic respiratory failure with hypoxia (HCC)    Autonomic dysfunction    Guillain Barr syndrome ([place])    Paroxysmal atrial fibrillation ([place])    Severe sepsis ([place])     History reviewed. No pertinent surgical history.  There were no vitals filed for this visit.   Subjective Assessment - 04/28/21 1235     Subjective No falls. Has been using his total gym at home and getting on and off with no issues.    Patient is accompained by: Family member    Pertinent History GBS, a fib, hx of covid    Limitations Standing;Walking;Sitting    How long can you stand comfortably? about 1-2 minutes at the countertop with daughter assisting.    Patient Stated Goals wants to get up and walk.    Currently in Pain? No/denies                               [facility] Adult PT Treatment/Exercise - 04/28/21 1239       Transfers   Transfers Sit to Stand;Stand to Sit    Sit to Stand 5: Supervision;With upper extremity assist;From  bed;From chair/3-in-1    Five time sit to stand comments  18.44 seconds single hand on RW and single hand on mat table, decr eccentric control    Stand to Sit 5: Supervision;With upper extremity assist;To bed;To chair/3-in-1      Ambulation/Gait   Ambulation/Gait Yes    Ambulation/Gait Assistance 5: Supervision    Ambulation/Gait Assistance Details outdoors over paved surfaces, needing one episode of min guard due to uneven portion in pavement    Ambulation Distance (Feet) 500 Feet    Assistive device Rolling walker;Other (Comment)    Gait Pattern Step-through pattern;Decreased stride length;Decreased step length - right;Decreased step length - left;Decreased dorsiflexion - right;Decreased dorsiflexion - left;Right steppage;Left steppage;Narrow base of support    Ambulation Surface Level;Indoor    Stairs Yes    Stairs Assistance 4: Min guard    Stairs Assistance Details (indicate cue type and reason) x1 rep with step to pattern with supervision, x2 reps with step through pattern using bilat handrails with min guard    Stair Management Technique Two rails;Alternating pattern;Step to pattern    Number of Stairs 4   x3 reps   Height of Stairs 6  Curb 5: Supervision;Other (comment)    Curb Details (indicate cue type and reason) x2 reps using RW, cues for proper sequencing, pt descending with LLE and ascending with RLE                 Balance Exercises - 04/28/21 1309       Balance Exercises: Standing   Standing Eyes Opened Narrow base of support (BOS);Solid surface    Standing Eyes Opened Limitations 4 reps of 20-30 seconds each    Standing Eyes Closed Wide (BOA);Solid surface    Standing Eyes Closed Limitations on level ground in // bars: 4 x 30 seconds with cues for posture and intermittent taps to bars    SLS with Vectors Solid surface;Upper extremity assist 1;Limitations    SLS with Vectors Limitations altertnating foot taps x10 reps B to blue air ex, needing fingertip  support FOR sls    Wall Bumps 15 reps;Hip;Eyes opened;Limitations    Wall Bumps Limitations in // bars, UE support > none, cues for technique    Other Standing Exercises with blue air ex: step up/up and down/down alternating legs with BUE support               PT Education - 04/28/21 1314     Education Details progress towards goals, scheduling for aquatics next [employer]) Educated Patient;Spouse    Methods Explanation    Comprehension Verbalized understanding              PT Short Term Goals - 04/29/21 0825       PT SHORT TERM GOAL #1   Title Pt will perform 4 steps with B handrails and step to pattern with min guard in order to demo improved functional mobility. ALL STGS DUE 04/26/21    Baseline step to with supervision, min guard with step through    Time 4    Period Weeks    Status Achieved    Target Date 04/26/21      PT SHORT TERM GOAL #2   Title Pt will improve gait speed to at least 1.5 ft/sec with RW and B AFOs in order to demo improved gait efficiency.    Baseline 1.31 ft/sec with RW; 1.74 ft/sec    Time 4    Period Weeks    Status Achieved      PT SHORT TERM GOAL #3   Title Pt will decr TUG time to 20 seconds or less with RW and B AFOs in order to demo decr fall risk.    Baseline 24.94 seconds; 18.78 seconds on 04/23/21    Time 4    Period Weeks    Status Achieved      PT SHORT TERM GOAL #4   Title Pt will undergo further assessment of 5x sit <> stand with LTG written as appropriate in order to demo improved balance and functional strength.    Baseline 18.44 seconds on 04/28/21 - LTG written    Time 4    Period Weeks    Status Achieved      PT SHORT TERM GOAL #5   Title Pt will ambulate at least 300' outdoors with RW and B AFOs with supervision in order to demo improved community mobility.    Baseline met 500' with supervision and one episode of min guard    Time 4    Period Weeks    Status Achieved  PT Long Term Goals  - 04/29/21 9983       PT LONG TERM GOAL #1   Title Pt and pt's caregiver will be independent with final HEP in order to build upon functional gains made in therapy. ALL LTGS DUE 05/24/21    Baseline 03/19/21: met with current program- will continue to benefit from updates    Time 8   due to delay in scheduling   Period Weeks    Status New      PT LONG TERM GOAL #2   Title Pt will improve BERG score to at least a 30/56 in order to demo decr fall risk.    Baseline 23/56    Time 8    Period Weeks    Status New      PT LONG TERM GOAL #3   Title Pt will ambulate at least 800' outdoors with RW and B AFOs and perform a curb with supervision in order to demo improved community mobility.    Time 8    Period Weeks    Status New      PT LONG TERM GOAL #4   Title Pt will improve 5x sit <> stand to 16 seconds or less from mat table with UE support in order to demo improved functional BLE strength for transfers.    Baseline 18.44 seconds on 04/28/21    Time 8    Period Weeks    Status Revised      PT LONG TERM GOAL #5   Title Pt will improve gait speed to at least 1.8 ft/sec with RW and B AFOs in order to demo improved gait efficiency.    Baseline 1.31 ft/sec with RW    Time 8    Period Weeks    Status New                   Plan - 04/29/21 3825     Clinical Impression Statement Checked remainder of STGs today with pt meeting all STGs. Pt ambulated 500' outdoors with RW and supervision with one episode of min guard. Pt almost met STG #1 and #4 in regards to 5x sit <> stand and stairs. Pt able to go up/down stairs with bilat handrails and step to with supervision and alternating pattern with min guard. Remainder of session focused on standing balance strategies on level ground working on decr UE support with vision removed and narrow BOS and SLS tasks with UE support. Pt is making excellent progress, will continue to progress towards LTGs    Personal Factors and Comorbidities  Comorbidity 3+;Past/Current Experience;Fitness   pt very active prior to diagnosis   Comorbidities GBS, a fib, hx of covid.    Examination-Activity Limitations Bathing;Bed Mobility;Caring for [employer];Locomotion Level;Dressing;Hygiene/Grooming;Transfers;Stand;Toileting    Examination-Participation Restrictions Cleaning;Community Activity;Yard Work;Meal Prep;Shop;Laundry;Driving;Occupation   exercise, cycling   Stability/Clinical Decision Making Evolving/Moderate complexity    Rehab Potential Good    PT Frequency 2x / week   1-2x a week   PT Duration 12 weeks    PT Treatment/Interventions Manual techniques;Therapeutic exercise;Gait training;Neuromuscular re-education;Aquatic Therapy;Orthotic Fit/Training    PT Next Visit Plan continue with strengthening, standing balance with decreased UE support with eyes closed/head motions. SciFit for strengthening and aerobic activity. Gait over/around objects, gait distance and gait on outdoor surfaces    PT Home Exercise Plan [email or web address]    Consulted and Agree with Plan of Care Patient;Family member/caregiver    Family Member Consulted pt's wife and daughter.  Patient will benefit from skilled therapeutic intervention in order to improve the following deficits and impairments:  Decreased activity tolerance, Decreased coordination, Decreased balance, Decreased endurance, Decreased range of motion, Decreased strength, Difficulty walking, Impaired sensation, Postural dysfunction  Visit Diagnosis: Muscle weakness (generalized)  Other symptoms and signs involving the nervous system  Difficulty in walking, not elsewhere classified     Problem List Patient Active Problem List   Diagnosis Date Noted   Acute on chronic respiratory failure with hypoxia (HCC)    Guillain Barr syndrome ([place])    Paroxysmal atrial fibrillation ([place])    Autonomic dysfunction    Severe sepsis ([place])     Arliss Journey, PT, DPT  04/29/2021, 8:45  AM  [facility] [address] [place], [place], [address] Phone: [phone]   Fax:  [phone]  Name: Joseph Mayo MRN: [id number] Date of Birth: 11/05/1945

## 2021-05-03 ENCOUNTER — Ambulatory Visit: Payer: Self-pay | Admitting: Physical Therapy

## 2021-05-03 ENCOUNTER — Other Ambulatory Visit: Payer: Self-pay

## 2021-05-03 ENCOUNTER — Ambulatory Visit: Payer: No Typology Code available for payment source | Admitting: Physical Therapy

## 2021-05-03 DIAGNOSIS — R262 Difficulty in walking, not elsewhere classified: Secondary | ICD-10-CM

## 2021-05-03 DIAGNOSIS — M6281 Muscle weakness (generalized): Secondary | ICD-10-CM

## 2021-05-03 NOTE — Therapy (Signed)
[facility] [address] [place], [place], [address] Phone: [phone]   Fax:  [phone]  Physical Therapy Treatment  Patient Details  Name: Joseph Mayo MRN: [id number] Date of Birth: [birth year] Referring Provider (PT): Greer Pickerel A   Encounter Date: 05/03/2021   PT End of Session - 05/03/21 1955     Visit Number 36    Number of Visits 79    Date for PT Re-Evaluation 06/14/21    Authorization Type [facility]    Authorization Time Period 15 visits through 04/28/21; 15 visits from 03/24/21-07/22/21    Authorization - Visit Number 6    Authorization - Number of Visits 15    PT Start Time 3151    PT Stop Time 1500    PT Time Calculation (min) 45 min    Equipment Utilized During Treatment Other (comment)   aquatic weights, large bar bell   Activity Tolerance Patient tolerated treatment well    Behavior During Therapy WFL for tasks assessed/performed             Past Medical History:  Diagnosis Date   Acute on chronic respiratory failure with hypoxia (HCC)    Autonomic dysfunction    Guillain Barr syndrome (HCC)    Paroxysmal atrial fibrillation ([place])    Severe sepsis ([place])     No past surgical history on file.  There were no vitals filed for this visit.   Subjective Assessment - 05/03/21 1953     Subjective Pt amb. into pool area with RW without AFO's today - did not use wheelchair    Patient is accompained by: Family member    Pertinent History GBS, a fib, hx of covid    Limitations Standing;Walking;Sitting    How long can you stand comfortably? about 1-2 minutes at the countertop with daughter assisting.    Patient Stated Goals wants to get up and walk.    Currently in Pain? No/denies              Aquatic therapy at [facility] - pool temp 95 degrees  Patient seen for aquatic therapy today.  Treatment took place in water 3.5-4.5 feet deep depending upon activity.  Pt entered and  Exited  the pool via step negotiation with use of bil. Hands rails, step by step with descension and step over step with ascension for exiting the pool.  Pt gait trained in approx. 4' water depth with HHA initially, large bar bell for bil. UE support, progressing to no UE support with CGA to SBA for safety as needed.  Pt gait trained approx. 18' x 6 reps across width of pool, approx. 25' x 1 rep at end of session.   Pt performed bil. LE strengthening exercises - squats x 10 reps bil. LE's, unilateral squats 10 reps each leg with UE support on side of pool prn  Pt performed standing hip flexion, extension and abduction with 2.5# on each leg 15 reps each;  step up exercise 10 reps each leg onto aquatic step in 4' water depth with minimal UE support; step down exercise 10 reps each leg for eccentric quad strengthening  Marching in place 10 reps each leg with weight for strengthening 10 reps each leg with UE support on side of pool prn for balance  Pt performed sit to stand from bench in pool (3.6' water depth)10 reps without UE support upon initial standing - CGA for balance recovery   Bil. Hamstring stretches passively - with both  knees positioned in extension for hamstring stretch - 30 sec hold x 1 rep  Static standing balance/trunk stabilization - pt stood with feet in bilateral stance pushed bar bell forward/back for core stabilization  Pt performed simulated leg press - pt supported by PT - both feet on pool wall - pushed pt toward pool wall for hip and knee flexion, pushing off wall into extension 5 reps   Pt requires buoyancy of water for support for reduced fall risk with gait training and balance exercises with minimal UE support; exercises able to be performed safely in water without the risk of fall compared to those same exercises performed on land;  viscosity of water needed for resistance for strengthening.  Current of water provides perturbations for challenging static & dynamic standing  balance.                             PT Short Term Goals - 05/03/21 2002       PT SHORT TERM GOAL #1   Title Pt will perform 4 steps with B handrails and step to pattern with min guard in order to demo improved functional mobility. ALL STGS DUE 04/26/21    Baseline step to with supervision, min guard with step through    Time 4    Period Weeks    Status Achieved    Target Date 04/26/21      PT SHORT TERM GOAL #2   Title Pt will improve gait speed to at least 1.5 ft/sec with RW and B AFOs in order to demo improved gait efficiency.    Baseline 1.31 ft/sec with RW; 1.74 ft/sec    Time 4    Period Weeks    Status Achieved      PT SHORT TERM GOAL #3   Title Pt will decr TUG time to 20 seconds or less with RW and B AFOs in order to demo decr fall risk.    Baseline 24.94 seconds; 18.78 seconds on 04/23/21    Time 4    Period Weeks    Status Achieved      PT SHORT TERM GOAL #4   Title Pt will undergo further assessment of 5x sit <> stand with LTG written as appropriate in order to demo improved balance and functional strength.    Baseline 18.44 seconds on 04/28/21 - LTG written    Time 4    Period Weeks    Status Achieved      PT SHORT TERM GOAL #5   Title Pt will ambulate at least 300' outdoors with RW and B AFOs with supervision in order to demo improved community mobility.    Baseline met 500' with supervision and one episode of min guard    Time 4    Period Weeks    Status Achieved               PT Long Term Goals - 05/03/21 2002       PT LONG TERM GOAL #1   Title Pt and pt's caregiver will be independent with final HEP in order to build upon functional gains made in therapy. ALL LTGS DUE 05/24/21    Baseline 03/19/21: met with current program- will continue to benefit from updates    Time 8   due to delay in scheduling   Period Weeks    Status New      PT LONG TERM GOAL #2   Title Pt  will improve BERG score to at least a 30/56 in order to demo  decr fall risk.    Baseline 23/56    Time 8    Period Weeks    Status New      PT LONG TERM GOAL #3   Title Pt will ambulate at least 800' outdoors with RW and B AFOs and perform a curb with supervision in order to demo improved community mobility.    Time 8    Period Weeks    Status New      PT LONG TERM GOAL #4   Title Pt will improve 5x sit <> stand to 16 seconds or less from mat table with UE support in order to demo improved functional BLE strength for transfers.    Baseline 18.44 seconds on 04/28/21    Time 8    Period Weeks    Status Revised      PT LONG TERM GOAL #5   Title Pt will improve gait speed to at least 1.8 ft/sec with RW and B AFOs in order to demo improved gait efficiency.    Baseline 1.31 ft/sec with RW    Time 8    Period Weeks    Status New                   Plan - 05/03/21 1957     Clinical Impression Statement Pt amb. with RW into pool area for first time rather than using wheelchair today (bil. AFO's not worn); pt entered and exited pool via step negotiation for first time, rather than using hydraulic chair lift.  Pt gait trained in pool without UE support as session progressed, with SBA for safety.  Pt is progressing well towards goals.    Personal Factors and Comorbidities Comorbidity 3+;Past/Current Experience;Fitness   pt very active prior to diagnosis   Comorbidities GBS, a fib, hx of covid.    Examination-Activity Limitations Bathing;Bed Mobility;Caring for [employer];Locomotion Level;Dressing;Hygiene/Grooming;Transfers;Stand;Toileting    Examination-Participation Restrictions Cleaning;Community Activity;Yard Work;Meal Prep;Shop;Laundry;Driving;Occupation   exercise, cycling   Stability/Clinical Decision Making Evolving/Moderate complexity    Rehab Potential Good    PT Frequency 2x / week   1-2x a week   PT Duration 12 weeks    PT Treatment/Interventions Manual techniques;Therapeutic exercise;Gait training;Neuromuscular  re-education;Aquatic Therapy;Orthotic Fit/Training    PT Next Visit Plan continue with strengthening, standing balance with decreased UE support with eyes closed/head motions. SciFit for strengthening and aerobic activity. Gait over/around objects, gait distance and gait on outdoor surfaces    PT Home Exercise Plan [email or web address]    Consulted and Agree with Plan of Care Patient;Family member/caregiver    Family Member Consulted pt's wife and daughter.             Patient will benefit from skilled therapeutic intervention in order to improve the following deficits and impairments:  Decreased activity tolerance, Decreased coordination, Decreased balance, Decreased endurance, Decreased range of motion, Decreased strength, Difficulty walking, Impaired sensation, Postural dysfunction  Visit Diagnosis: Muscle weakness (generalized)  Difficulty in walking, not elsewhere classified     Problem List Patient Active Problem List   Diagnosis Date Noted   Acute on chronic respiratory failure with hypoxia (HCC)    Guillain Barr syndrome ([place])    Paroxysmal atrial fibrillation ([place])    Autonomic dysfunction    Severe sepsis [facility])     Bera Pinela, [place], PT, ATRIC 05/03/2021, 8:04 PM  [facility] [address] [place],  [place], [address] Phone: [phone]   Fax:  [phone]  Name: Joseph Mayo MRN: [id number] Date of Birth: 12-Oct-1945

## 2021-05-05 ENCOUNTER — Encounter: Payer: Self-pay | Admitting: Physical Therapy

## 2021-05-05 ENCOUNTER — Other Ambulatory Visit: Payer: Self-pay

## 2021-05-05 ENCOUNTER — Ambulatory Visit: Payer: No Typology Code available for payment source | Admitting: Physical Therapy

## 2021-05-05 DIAGNOSIS — M6281 Muscle weakness (generalized): Secondary | ICD-10-CM

## 2021-05-05 DIAGNOSIS — R262 Difficulty in walking, not elsewhere classified: Secondary | ICD-10-CM

## 2021-05-05 DIAGNOSIS — R29818 Other symptoms and signs involving the nervous system: Secondary | ICD-10-CM

## 2021-05-06 NOTE — Therapy (Signed)
[facility] [address] [place], [place], [address] Phone: [phone]   Fax:  [phone]  Physical Therapy Treatment  Patient Details  Name: Joseph Mayo MRN: [id number] Date of Birth: [birth year] Referring Provider (PT): Greer Pickerel A   Encounter Date: 05/05/2021   PT End of Session - 05/05/21 1237     Visit Number 37    Number of Visits 45    Date for PT Re-Evaluation 06/14/21    Authorization Type [facility]    Authorization Time Period 15 visits through 04/28/21; 15 visits from 03/24/21-07/22/21    Authorization - Visit Number 7    Authorization - Number of Visits 15    PT Start Time 1233    PT Stop Time 1315    PT Time Calculation (min) 42 min    Equipment Utilized During Treatment Other (comment);Gait belt   bil braces   Activity Tolerance Patient tolerated treatment well    Behavior During Therapy WFL for tasks assessed/performed             Past Medical History:  Diagnosis Date   Acute on chronic respiratory failure with hypoxia (HCC)    Autonomic dysfunction    Guillain Barr syndrome ([place])    Paroxysmal atrial fibrillation ([place])    Severe sepsis ([place])     History reviewed. No pertinent surgical history.  There were no vitals filed for this visit.   Subjective Assessment - 05/05/21 1236     Subjective No new complatins. No falls or pain to report. Felt good after last session.    Patient is accompained by: Family member   spouse   Pertinent History GBS, a fib, hx of covid    Limitations Standing;Walking;Sitting    How long can you stand comfortably? about 1-2 minutes at the countertop with daughter assisting.    Patient Stated Goals wants to get up and walk.    Currently in Pain? No/denies                    [facility] Adult PT Treatment/Exercise - 05/05/21 1238       Transfers   Transfers Sit to Stand;Stand to Sit    Sit to Stand 5: Supervision;With upper extremity assist;From  bed;From chair/3-in-1    Stand to Sit 5: Supervision;With upper extremity assist;To bed;To chair/3-in-1      Ambulation/Gait   Ambulation/Gait Yes    Ambulation/Gait Assistance 5: Supervision;4: Min guard    Ambulation/Gait Assistance Details gait with RW over outdoor compliant surfaces with min guard assist, no significant balance issues noted with compliant surfaces outdoors.    Ambulation Distance (Feet) 500 Feet   x1 in/outdoors, plus around clinic with session   Assistive device Rolling walker;Other (Comment)   bil braces   Gait Pattern Step-through pattern;Decreased stride length;Decreased step length - right;Decreased step length - left;Decreased dorsiflexion - right;Decreased dorsiflexion - left;Right steppage;Left steppage;Narrow base of support    Ambulation Surface Level;Unlevel;Indoor;Outdoor;Paved;Gravel;Grass      High Level Balance   High Level Balance Activities Side stepping;Marching forwards;Backward walking    High Level Balance Comments on solid floor with no UE support, occasional touch to bars for side stepping x 3 laps both ways with min guard to min assist for balance; then forward marching/backward walking for 3 laps each way with single UE support on bars, min guard assist for safety. cues on posture and form/technique with each one. min      Knee/Hip Exercises: Aerobic  Other Aerobic [place] UE/LE's (seat 20 and arms 9) on level 3.5 x 6 minutes with goal >/= 60 steps per minute for strengthening and activity tolerance                 Balance Exercises - 05/05/21 1308       Balance Exercises: Standing   Standing Eyes Closed Narrow base of support (BOS);Wide (BOA);Head turns;Solid surface;Other reps (comment);30 secs;Limitations    Standing Eyes Closed Limitations on solid floor no UE support: feet together for EC 30 sec's x 3 reps, then feet apart for EC head movements left<>right, up<>down for ~10 reps each. Min guard to min assist for balance with cues on  posture/weight shifting for balance assist.                  PT Short Term Goals - 05/03/21 2002       PT SHORT TERM GOAL #1   Title Pt will perform 4 steps with B handrails and step to pattern with min guard in order to demo improved functional mobility. ALL STGS DUE 04/26/21    Baseline step to with supervision, min guard with step through    Time 4    Period Weeks    Status Achieved    Target Date 04/26/21      PT SHORT TERM GOAL #2   Title Pt will improve gait speed to at least 1.5 ft/sec with RW and B AFOs in order to demo improved gait efficiency.    Baseline 1.31 ft/sec with RW; 1.74 ft/sec    Time 4    Period Weeks    Status Achieved      PT SHORT TERM GOAL #3   Title Pt will decr TUG time to 20 seconds or less with RW and B AFOs in order to demo decr fall risk.    Baseline 24.94 seconds; 18.78 seconds on 04/23/21    Time 4    Period Weeks    Status Achieved      PT SHORT TERM GOAL #4   Title Pt will undergo further assessment of 5x sit <> stand with LTG written as appropriate in order to demo improved balance and functional strength.    Baseline 18.44 seconds on 04/28/21 - LTG written    Time 4    Period Weeks    Status Achieved      PT SHORT TERM GOAL #5   Title Pt will ambulate at least 300' outdoors with RW and B AFOs with supervision in order to demo improved community mobility.    Baseline met 500' with supervision and one episode of min guard    Time 4    Period Weeks    Status Achieved               PT Long Term Goals - 05/03/21 2002       PT LONG TERM GOAL #1   Title Pt and pt's caregiver will be independent with final HEP in order to build upon functional gains made in therapy. ALL LTGS DUE 05/24/21    Baseline 03/19/21: met with current program- will continue to benefit from updates    Time 8   due to delay in scheduling   Period Weeks    Status New      PT LONG TERM GOAL #2   Title Pt will improve BERG score to at least a 30/56 in order  to demo decr fall risk.    Baseline 23/56  Time 8    Period Weeks    Status New      PT LONG TERM GOAL #3   Title Pt will ambulate at least 800' outdoors with RW and B AFOs and perform a curb with supervision in order to demo improved community mobility.    Time 8    Period Weeks    Status New      PT LONG TERM GOAL #4   Title Pt will improve 5x sit <> stand to 16 seconds or less from mat table with UE support in order to demo improved functional BLE strength for transfers.    Baseline 18.44 seconds on 04/28/21    Time 8    Period Weeks    Status Revised      PT LONG TERM GOAL #5   Title Pt will improve gait speed to at least 1.8 ft/sec with RW and B AFOs in order to demo improved gait efficiency.    Baseline 1.31 ft/sec with RW    Time 8    Period Weeks    Status New                   Plan - 05/05/21 1238     Clinical Impression Statement Today's skilled session focused on strengthening, gait on various surfaces and balance training with emphasis on decreased UE support. No issues noted or reported in session. The pt demo'd no balance issues on outdoor compliant surfaces with RW this session. The pt is making progress toward goals and should benefit from continued PT to progress toward unmet goals.    Personal Factors and Comorbidities Comorbidity 3+;Past/Current Experience;Fitness   pt very active prior to diagnosis   Comorbidities GBS, a fib, hx of covid.    Examination-Activity Limitations Bathing;Bed Mobility;Caring for [employer];Locomotion Level;Dressing;Hygiene/Grooming;Transfers;Stand;Toileting    Examination-Participation Restrictions Cleaning;Community Activity;Yard Work;Meal Prep;Shop;Laundry;Driving;Occupation   exercise, cycling   Stability/Clinical Decision Making Evolving/Moderate complexity    Rehab Potential Good    PT Frequency 2x / week   1-2x a week   PT Duration 12 weeks    PT Treatment/Interventions Manual techniques;Therapeutic  exercise;Gait training;Neuromuscular re-education;Aquatic Therapy;Orthotic Fit/Training    PT Next Visit Plan continue with strengthening, standing balance with decreased UE support with eyes closed/head motions. SciFit for strengthening and aerobic activity. Gait over/around objects, gait distance and gait on outdoor surfaces    PT Home Exercise Plan [email or web address]    Consulted and Agree with Plan of Care Patient;Family member/caregiver    Family Member Consulted pt's wife and daughter.             Patient will benefit from skilled therapeutic intervention in order to improve the following deficits and impairments:  Decreased activity tolerance, Decreased coordination, Decreased balance, Decreased endurance, Decreased range of motion, Decreased strength, Difficulty walking, Impaired sensation, Postural dysfunction  Visit Diagnosis: Muscle weakness (generalized)  Difficulty in walking, not elsewhere classified  Other symptoms and signs involving the nervous system     Problem List Patient Active Problem List   Diagnosis Date Noted   Acute on chronic respiratory failure with hypoxia ([place])    Guillain Barr syndrome ([place])    Paroxysmal atrial fibrillation ([place])    Autonomic dysfunction    Severe sepsis ([place])     Willow Ora, PTA, [facility] [address], [place], [place] [address] [phone] 05/06/21, 3:50 PM   Name: ADARIUS TIGGES MRN: [id number] Date of Birth: August 03, 1946

## 2021-05-10 ENCOUNTER — Ambulatory Visit: Payer: No Typology Code available for payment source | Admitting: Physical Therapy

## 2021-05-10 ENCOUNTER — Other Ambulatory Visit: Payer: Self-pay

## 2021-05-10 ENCOUNTER — Encounter: Payer: Self-pay | Admitting: Physical Therapy

## 2021-05-10 DIAGNOSIS — M6281 Muscle weakness (generalized): Secondary | ICD-10-CM | POA: Diagnosis not present

## 2021-05-10 DIAGNOSIS — R293 Abnormal posture: Secondary | ICD-10-CM

## 2021-05-10 DIAGNOSIS — R208 Other disturbances of skin sensation: Secondary | ICD-10-CM

## 2021-05-10 DIAGNOSIS — R29818 Other symptoms and signs involving the nervous system: Secondary | ICD-10-CM

## 2021-05-10 DIAGNOSIS — R262 Difficulty in walking, not elsewhere classified: Secondary | ICD-10-CM

## 2021-05-10 NOTE — Therapy (Signed)
[facility] [address] [place], [place], [address] Phone: [phone]   Fax:  [phone]  Physical Therapy Treatment  Patient Details  Name: Joseph Mayo MRN: [id number] Date of Birth: 1946-06-11 Referring Provider (PT): Greer Pickerel A   Encounter Date: 05/10/2021   PT End of Session - 05/10/21 1559     Visit Number 38    Number of Visits 71    Date for PT Re-Evaluation 06/14/21    Authorization Type [facility]    Authorization Time Period 15 visits through 04/28/21; 15 visits from 03/24/21-07/22/21    Authorization - Visit Number 8    Authorization - Number of Visits 15    PT Start Time 1143    PT Stop Time 1230    PT Time Calculation (min) 47 min    Equipment Utilized During Treatment Other (comment)   aquatic weights, large bar bell   Activity Tolerance Patient tolerated treatment well    Behavior During Therapy WFL for tasks assessed/performed             Past Medical History:  Diagnosis Date   Acute on chronic respiratory failure with hypoxia (HCC)    Autonomic dysfunction    Guillain Barr syndrome (HCC)    Paroxysmal atrial fibrillation ([place])    Severe sepsis ([place])     History reviewed. No pertinent surgical history.  There were no vitals filed for this visit.   Subjective Assessment - 05/10/21 1558     Subjective No new complaints. No falls or pain to report.    Patient is accompained by: Family member   spouse   Pertinent History GBS, a fib, hx of covid    Limitations Standing;Walking;Sitting    How long can you stand comfortably? about 1-2 minutes at the countertop with daughter assisting.    Patient Stated Goals wants to get up and walk.    Currently in Pain? No/denies               Aquatic therapy at [facility] temp 94 degrees   Patient seen for aquatic therapy today.  Treatment took place in water 3.5-4.5 feet deep depending upon activity.  Pt entered and  Exited the  pool via step negotiation with use of bil. Hands rails, step by step with descension and step over step with ascension for exiting the pool with min guard assist.    Seated on ledge of pool: bil Hamstring stretching performed passively concurrent with heel cord stretching for 30 sec's x 3 reps each side. Then has pt perform sit<>stands for 2 sets of 10 reps with no UE support, PTA assisting to keep feet anchored to floor of pool.   Pt performed bil LE strengthening as follows: with 2.5# ankle weights for alternating hip abduction, extension and flexion for 15 reps. Alternating marching for 10 reps each side. At stairs with single UE support for forward step ups with contralateral march for 10 reps each side.   Gait in pool: with large bar bell across pool at ~4 foot depth forward/backward stepping, then laterally for 4 laps each, with min guard to min assist for balance. Then with small bar bell (one in each hand)- alternating punching with bar bell at water height with forward/backward gait at ~4 foot depth for 3 laps, then lateral stepping while moving arms into adduction down to legs/abduction back to water level for 3 laps. Min guard assist to min assist at times with cues on posture.  Working on static standing balance: at steps had pt work on alternating foot taps to bottom steps forward, then laterally with single UE support, cues on posture and weight shifting. In lateral stance with feet just wider that hip width apart: using bar bells for shoulder horizontal abduction, pushing them forward/backward, then shoulder adduction down to legs/abduction back to water level. In wide staggered stance moving arms with dumb bell reciprocally under water level. Min guard to min assist for balance.    Pt requires buoyancy of water for support for reduced fall risk with gait training and balance exercises with minimal UE support; exercises able to be performed safely in water without the risk of fall compared  to those same exercises performed on land;  viscosity of water needed for resistance for strengthening.  Current of water provides perturbations for challenging static & dynamic standing balance.             PT Short Term Goals - 05/03/21 2002       PT SHORT TERM GOAL #1   Title Pt will perform 4 steps with B handrails and step to pattern with min guard in order to demo improved functional mobility. ALL STGS DUE 04/26/21    Baseline step to with supervision, min guard with step through    Time 4    Period Weeks    Status Achieved    Target Date 04/26/21      PT SHORT TERM GOAL #2   Title Pt will improve gait speed to at least 1.5 ft/sec with RW and B AFOs in order to demo improved gait efficiency.    Baseline 1.31 ft/sec with RW; 1.74 ft/sec    Time 4    Period Weeks    Status Achieved      PT SHORT TERM GOAL #3   Title Pt will decr TUG time to 20 seconds or less with RW and B AFOs in order to demo decr fall risk.    Baseline 24.94 seconds; 18.78 seconds on 04/23/21    Time 4    Period Weeks    Status Achieved      PT SHORT TERM GOAL #4   Title Pt will undergo further assessment of 5x sit <> stand with LTG written as appropriate in order to demo improved balance and functional strength.    Baseline 18.44 seconds on 04/28/21 - LTG written    Time 4    Period Weeks    Status Achieved      PT SHORT TERM GOAL #5   Title Pt will ambulate at least 300' outdoors with RW and B AFOs with supervision in order to demo improved community mobility.    Baseline met 500' with supervision and one episode of min guard    Time 4    Period Weeks    Status Achieved               PT Long Term Goals - 05/03/21 2002       PT LONG TERM GOAL #1   Title Pt and pt's caregiver will be independent with final HEP in order to build upon functional gains made in therapy. ALL LTGS DUE 05/24/21    Baseline 03/19/21: met with current program- will continue to benefit from updates    Time 8   due  to delay in scheduling   Period Weeks    Status New      PT LONG TERM GOAL #2   Title Pt will improve  BERG score to at least a 30/56 in order to demo decr fall risk.    Baseline 23/56    Time 8    Period Weeks    Status New      PT LONG TERM GOAL #3   Title Pt will ambulate at least 800' outdoors with RW and B AFOs and perform a curb with supervision in order to demo improved community mobility.    Time 8    Period Weeks    Status New      PT LONG TERM GOAL #4   Title Pt will improve 5x sit <> stand to 16 seconds or less from mat table with UE support in order to demo improved functional BLE strength for transfers.    Baseline 18.44 seconds on 04/28/21    Time 8    Period Weeks    Status Revised      PT LONG TERM GOAL #5   Title Pt will improve gait speed to at least 1.8 ft/sec with RW and B AFOs in order to demo improved gait efficiency.    Baseline 1.31 ft/sec with RW    Time 8    Period Weeks    Status New                   Plan - 05/10/21 1602     Clinical Impression Statement Today's skilled session continued to focus on strengthening, balance and gait in an aquatic setting. Pt once again able to use steps with bil rails to enter/exit pool. No issues noted or reported after session. The pt is progressing and should benefit from continued PT to progress toward unmet goals.    Personal Factors and Comorbidities Comorbidity 3+;Past/Current Experience;Fitness   pt very active prior to diagnosis   Comorbidities GBS, a fib, hx of covid.    Examination-Activity Limitations Bathing;Bed Mobility;Caring for [employer];Locomotion Level;Dressing;Hygiene/Grooming;Transfers;Stand;Toileting    Examination-Participation Restrictions Cleaning;Community Activity;Yard Work;Meal Prep;Shop;Laundry;Driving;Occupation   exercise, cycling   Stability/Clinical Decision Making Evolving/Moderate complexity    Rehab Potential Good    PT Frequency 2x / week   1-2x a week   PT  Duration 12 weeks    PT Treatment/Interventions Manual techniques;Therapeutic exercise;Gait training;Neuromuscular re-education;Aquatic Therapy;Orthotic Fit/Training    PT Next Visit Plan continue with strengthening, standing balance with decreased UE support with eyes closed/head motions. SciFit for strengthening and aerobic activity. Gait over/around objects, gait distance and gait on outdoor surfaces    PT Home Exercise Plan [email or web address]    Consulted and Agree with Plan of Care Patient;Family member/caregiver    Family Member Consulted pt's wife and daughter.             Patient will benefit from skilled therapeutic intervention in order to improve the following deficits and impairments:  Decreased activity tolerance, Decreased coordination, Decreased balance, Decreased endurance, Decreased range of motion, Decreased strength, Difficulty walking, Impaired sensation, Postural dysfunction  Visit Diagnosis: Muscle weakness (generalized)  Difficulty in walking, not elsewhere classified  Other symptoms and signs involving the nervous system  Other disturbances of skin sensation  Abnormal posture     Problem List Patient Active Problem List   Diagnosis Date Noted   Acute on chronic respiratory failure with hypoxia ([place])    Guillain Barr syndrome ([place])    Paroxysmal atrial fibrillation ([place])    Autonomic dysfunction    Severe sepsis ([place])     Willow Ora, PTA, [facility] [address], [place], [place]  [address] [phone] 05/10/21, 4:25 PM   Name: Joseph Mayo MRN: [id number] Date of Birth: 05-Aug-1946

## 2021-05-12 ENCOUNTER — Other Ambulatory Visit: Payer: Self-pay

## 2021-05-12 ENCOUNTER — Encounter: Payer: Self-pay | Admitting: Physical Therapy

## 2021-05-12 ENCOUNTER — Ambulatory Visit: Payer: No Typology Code available for payment source | Admitting: Physical Therapy

## 2021-05-12 DIAGNOSIS — M6281 Muscle weakness (generalized): Secondary | ICD-10-CM | POA: Diagnosis not present

## 2021-05-12 DIAGNOSIS — R262 Difficulty in walking, not elsewhere classified: Secondary | ICD-10-CM

## 2021-05-12 DIAGNOSIS — R29818 Other symptoms and signs involving the nervous system: Secondary | ICD-10-CM

## 2021-05-13 NOTE — Therapy (Signed)
[facility] [address] [place], [place], [address] Phone: [phone]   Fax:  [phone]  Physical Therapy Treatment  Patient Details  Name: Joseph Mayo MRN: [id number] Date of Birth: 07-22-1946 Referring Provider (PT): Greer Pickerel A   Encounter Date: 05/12/2021   PT End of Session - 05/12/21 1107     Visit Number 39    Number of Visits 52    Date for PT Re-Evaluation 06/14/21    Authorization Type [facility]    Authorization Time Period 15 visits through 04/28/21; 15 visits from 03/24/21-07/22/21    Authorization - Visit Number 9    Authorization - Number of Visits 15    PT Start Time 1104    PT Stop Time 1145    PT Time Calculation (min) 41 min    Equipment Utilized During Treatment Gait belt    Activity Tolerance Patient tolerated treatment well    Behavior During Therapy WFL for tasks assessed/performed             Past Medical History:  Diagnosis Date   Acute on chronic respiratory failure with hypoxia (HCC)    Autonomic dysfunction    Guillain Barr syndrome ([place])    Paroxysmal atrial fibrillation ([place])    Severe sepsis ([place])     History reviewed. No pertinent surgical history.  There were no vitals filed for this visit.   Subjective Assessment - 05/12/21 1107     Subjective No new complaints. No falls or pain to report.    Patient is accompained by: Family member   spouse   Pertinent History GBS, a fib, hx of covid    Limitations Standing;Walking;Sitting    How long can you stand comfortably? about 1-2 minutes at the countertop with daughter assisting.    Patient Stated Goals wants to get up and walk.    Currently in Pain? No/denies                   [facility] Adult PT Treatment/Exercise - 05/12/21 1108       Transfers   Transfers Sit to Stand;Stand to Sit    Sit to Stand 5: Supervision;With upper extremity assist;From bed;From chair/3-in-1    Stand to Sit 5: Supervision;With  upper extremity assist;To bed;To chair/3-in-1      Ambulation/Gait   Ambulation/Gait Yes    Ambulation/Gait Assistance 5: Supervision    Ambulation Distance (Feet) --   around gym with session   Assistive device Rolling walker;Other (Comment)   bil braces   Gait Pattern Step-through pattern;Decreased stride length;Decreased step length - right;Decreased step length - left;Decreased dorsiflexion - right;Decreased dorsiflexion - left;Right steppage;Left steppage;Narrow base of support    Ambulation Surface Level;Indoor      High Level Balance   High Level Balance Activities Side stepping;Marching forwards;Marching backwards    High Level Balance Comments in parallel bars with light to no UE support: 3 laps each/each way with cues on posture and ex form/technique. min guard to min assist for balance.      Knee/Hip Exercises: Aerobic   Other Aerobic [place] UE/LE's (seat 20 and arms 9) on level 4.0 x 8  minutes with goal >/= 60 steps per minute for strengthening and activity tolerance      Knee/Hip Exercises: Standing   Lateral Step Up Both;1 set;10 reps;Hand Hold: 2;Step Height: 4"    Lateral Step Up Limitations lifting other leg up and back down with bil UE support. cues on posture/technique.  Forward Step Up Both;1 set;10 reps;Hand Hold: 2;Step Height: 4";Limitations    Forward Step Up Limitations contralateral march with bil UE support . cues for posture and decreased UE use                 Balance Exercises - 05/12/21 1132       Balance Exercises: Standing   Rockerboard Anterior/posterior;Lateral;EO;EC;30 seconds;Other reps (comment);Intermittent UE support;Limitations;UE support    Rockerboard Limitations performed both ways on balance board while holding the board steadyy: alternating UE raises, progressing to bil UE raises. then with no UE support for EC 30 sec's x3 reps. min guard to min assist for balance with cues on posture and weight shifting for balance assistance.                   PT Short Term Goals - 05/03/21 2002       PT SHORT TERM GOAL #1   Title Pt will perform 4 steps with B handrails and step to pattern with min guard in order to demo improved functional mobility. ALL STGS DUE 04/26/21    Baseline step to with supervision, min guard with step through    Time 4    Period Weeks    Status Achieved    Target Date 04/26/21      PT SHORT TERM GOAL #2   Title Pt will improve gait speed to at least 1.5 ft/sec with RW and B AFOs in order to demo improved gait efficiency.    Baseline 1.31 ft/sec with RW; 1.74 ft/sec    Time 4    Period Weeks    Status Achieved      PT SHORT TERM GOAL #3   Title Pt will decr TUG time to 20 seconds or less with RW and B AFOs in order to demo decr fall risk.    Baseline 24.94 seconds; 18.78 seconds on 04/23/21    Time 4    Period Weeks    Status Achieved      PT SHORT TERM GOAL #4   Title Pt will undergo further assessment of 5x sit <> stand with LTG written as appropriate in order to demo improved balance and functional strength.    Baseline 18.44 seconds on 04/28/21 - LTG written    Time 4    Period Weeks    Status Achieved      PT SHORT TERM GOAL #5   Title Pt will ambulate at least 300' outdoors with RW and B AFOs with supervision in order to demo improved community mobility.    Baseline met 500' with supervision and one episode of min guard    Time 4    Period Weeks    Status Achieved               PT Long Term Goals - 05/03/21 2002       PT LONG TERM GOAL #1   Title Pt and pt's caregiver will be independent with final HEP in order to build upon functional gains made in therapy. ALL LTGS DUE 05/24/21    Baseline 03/19/21: met with current program- will continue to benefit from updates    Time 8   due to delay in scheduling   Period Weeks    Status New      PT LONG TERM GOAL #2   Title Pt will improve BERG score to at least a 30/56 in order to demo decr fall risk.    Baseline 23/56  Time 8    Period Weeks    Status New      PT LONG TERM GOAL #3   Title Pt will ambulate at least 800' outdoors with RW and B AFOs and perform a curb with supervision in order to demo improved community mobility.    Time 8    Period Weeks    Status New      PT LONG TERM GOAL #4   Title Pt will improve 5x sit <> stand to 16 seconds or less from mat table with UE support in order to demo improved functional BLE strength for transfers.    Baseline 18.44 seconds on 04/28/21    Time 8    Period Weeks    Status Revised      PT LONG TERM GOAL #5   Title Pt will improve gait speed to at least 1.8 ft/sec with RW and B AFOs in order to demo improved gait efficiency.    Baseline 1.31 ft/sec with RW    Time 8    Period Weeks    Status New                   Plan - 05/12/21 1108     Clinical Impression Statement Today's skilled session continued to focus on strengthening and balance with decreased UE support with rest breaks taken as needed. No other issues noted or reported in session. The pt is making steady progress toward goals and should benefit from continued PT to progress toward unmet goals.    Personal Factors and Comorbidities Comorbidity 3+;Past/Current Experience;Fitness   pt very active prior to diagnosis   Comorbidities GBS, a fib, hx of covid.    Examination-Activity Limitations Bathing;Bed Mobility;Caring for [employer];Locomotion Level;Dressing;Hygiene/Grooming;Transfers;Stand;Toileting    Examination-Participation Restrictions Cleaning;Community Activity;Yard Work;Meal Prep;Shop;Laundry;Driving;Occupation   exercise, cycling   Stability/Clinical Decision Making Evolving/Moderate complexity    Rehab Potential Good    PT Frequency 2x / week   1-2x a week   PT Duration 12 weeks    PT Treatment/Interventions Manual techniques;Therapeutic exercise;Gait training;Neuromuscular re-education;Aquatic Therapy;Orthotic Fit/Training    PT Next Visit Plan continue with  strengthening, standing balance with decreased UE support with eyes closed/head motions. SciFit for strengthening and aerobic activity. Gait over/around objects, gait distance and gait on outdoor surfaces    PT Home Exercise Plan [id number]    Consulted and Agree with Plan of Care Patient;Family member/caregiver    Family Member Consulted pt's wife and daughter.             Patient will benefit from skilled therapeutic intervention in order to improve the following deficits and impairments:  Decreased activity tolerance, Decreased coordination, Decreased balance, Decreased endurance, Decreased range of motion, Decreased strength, Difficulty walking, Impaired sensation, Postural dysfunction  Visit Diagnosis: Muscle weakness (generalized)  Difficulty in walking, not elsewhere classified  Other symptoms and signs involving the nervous system     Problem List Patient Active Problem List   Diagnosis Date Noted   Acute on chronic respiratory failure with hypoxia ([place])    Guillain Barr syndrome ([place])    Paroxysmal atrial fibrillation ([place])    Autonomic dysfunction    Severe sepsis [facility])     Willow Ora, PTA 05/13/2021, 6:53 PM  [facility] [address] [place], [place], [address] Phone: [phone]   Fax:  [phone]  Name: Joseph Mayo MRN: [id number] Date of Birth: [birth year]

## 2021-05-17 ENCOUNTER — Encounter: Payer: Self-pay | Admitting: Physical Therapy

## 2021-05-17 ENCOUNTER — Ambulatory Visit: Payer: No Typology Code available for payment source | Admitting: Physical Therapy

## 2021-05-17 ENCOUNTER — Other Ambulatory Visit: Payer: Self-pay

## 2021-05-17 DIAGNOSIS — M6281 Muscle weakness (generalized): Secondary | ICD-10-CM | POA: Diagnosis not present

## 2021-05-17 DIAGNOSIS — R29818 Other symptoms and signs involving the nervous system: Secondary | ICD-10-CM

## 2021-05-17 DIAGNOSIS — R262 Difficulty in walking, not elsewhere classified: Secondary | ICD-10-CM

## 2021-05-17 DIAGNOSIS — R293 Abnormal posture: Secondary | ICD-10-CM

## 2021-05-17 NOTE — Therapy (Signed)
[facility] [address] [place], [place], [address] Phone: [phone]   Fax:  [phone]  Physical Therapy Treatment  Patient Details  Name: Joseph Mayo MRN: [id number] Date of Birth: [birth year] Referring Provider (PT): Greer Pickerel A   Encounter Date: 05/17/2021   PT End of Session - 05/17/21 2228     Visit Number 40    Number of Visits 67    Date for PT Re-Evaluation 06/14/21    Authorization Type [facility]    Authorization Time Period 15 visits through 04/28/21; 15 visits from 03/24/21-07/22/21    Authorization - Visit Number 10    Authorization - Number of Visits 15    PT Start Time 4709    PT Stop Time 1228    PT Time Calculation (min) 43 min    Equipment Utilized During Treatment Gait belt    Activity Tolerance Patient tolerated treatment well    Behavior During Therapy WFL for tasks assessed/performed             Past Medical History:  Diagnosis Date   Acute on chronic respiratory failure with hypoxia (HCC)    Autonomic dysfunction    Guillain Barr syndrome ([place])    Paroxysmal atrial fibrillation ([place])    Severe sepsis ([place])     History reviewed. No pertinent surgical history.  There were no vitals filed for this visit.   Subjective Assessment - 05/17/21 2227     Subjective No new complaints. No falls or pain to report.    Patient is accompained by: Family member   spouse   Pertinent History GBS, a fib, hx of covid    Limitations Standing;Walking;Sitting    How long can you stand comfortably? about 1-2 minutes at the countertop with daughter assisting.    Patient Stated Goals wants to get up and walk.    Currently in Pain? No/denies              Aquatic therapy at [facility] temp 94 degrees   Patient seen for aquatic therapy today.  Treatment took place in water 3.5-4.5 feet deep depending upon activity.  Pt entered and  Exited the pool via step negotiation with use of  bil. Hands rails, step by step to enter and exit pool with min guard assist.       Pt requires buoyancy of water for support for reduced fall risk with gait training and balance exercises with minimal UE support; exercises able to be performed safely in water without the risk of fall compared to those same exercises performed on land;  viscosity of water needed for resistance for strengthening.  Current of water provides perturbations for challenging static & dynamic standing balance.                               PT Short Term Goals - 05/03/21 2002       PT SHORT TERM GOAL #1   Title Pt will perform 4 steps with B handrails and step to pattern with min guard in order to demo improved functional mobility. ALL STGS DUE 04/26/21    Baseline step to with supervision, min guard with step through    Time 4    Period Weeks    Status Achieved    Target Date 04/26/21      PT SHORT TERM GOAL #2   Title Pt will improve gait speed to at least  1.5 ft/sec with RW and B AFOs in order to demo improved gait efficiency.    Baseline 1.31 ft/sec with RW; 1.74 ft/sec    Time 4    Period Weeks    Status Achieved      PT SHORT TERM GOAL #3   Title Pt will decr TUG time to 20 seconds or less with RW and B AFOs in order to demo decr fall risk.    Baseline 24.94 seconds; 18.78 seconds on 04/23/21    Time 4    Period Weeks    Status Achieved      PT SHORT TERM GOAL #4   Title Pt will undergo further assessment of 5x sit <> stand with LTG written as appropriate in order to demo improved balance and functional strength.    Baseline 18.44 seconds on 04/28/21 - LTG written    Time 4    Period Weeks    Status Achieved      PT SHORT TERM GOAL #5   Title Pt will ambulate at least 300' outdoors with RW and B AFOs with supervision in order to demo improved community mobility.    Baseline met 500' with supervision and one episode of min guard    Time 4    Period Weeks    Status Achieved                PT Long Term Goals - 05/03/21 2002       PT LONG TERM GOAL #1   Title Pt and pt's caregiver will be independent with final HEP in order to build upon functional gains made in therapy. ALL LTGS DUE 05/24/21    Baseline 03/19/21: met with current program- will continue to benefit from updates    Time 8   due to delay in scheduling   Period Weeks    Status New      PT LONG TERM GOAL #2   Title Pt will improve BERG score to at least a 30/56 in order to demo decr fall risk.    Baseline 23/56    Time 8    Period Weeks    Status New      PT LONG TERM GOAL #3   Title Pt will ambulate at least 800' outdoors with RW and B AFOs and perform a curb with supervision in order to demo improved community mobility.    Time 8    Period Weeks    Status New      PT LONG TERM GOAL #4   Title Pt will improve 5x sit <> stand to 16 seconds or less from mat table with UE support in order to demo improved functional BLE strength for transfers.    Baseline 18.44 seconds on 04/28/21    Time 8    Period Weeks    Status Revised      PT LONG TERM GOAL #5   Title Pt will improve gait speed to at least 1.8 ft/sec with RW and B AFOs in order to demo improved gait efficiency.    Baseline 1.31 ft/sec with RW    Time 8    Period Weeks    Status New                   Plan - 05/17/21 2229     Clinical Impression Statement Today's skilled session continued to focus on gait, strengthening and balance in the aquatic setting. No issues noted or reported in session. The pt continues  to improve his ability to walk and balance with decreased UE support in ~4 foot water. The pt is making progress toward goals and should benefit from continued PT to progress toward goals.    Personal Factors and Comorbidities Comorbidity 3+;Past/Current Experience;Fitness   pt very active prior to diagnosis   Comorbidities GBS, a fib, hx of covid.    Examination-Activity Limitations Bathing;Bed Mobility;Caring  for [employer];Locomotion Level;Dressing;Hygiene/Grooming;Transfers;Stand;Toileting    Examination-Participation Restrictions Cleaning;Community Activity;Yard Work;Meal Prep;Shop;Laundry;Driving;Occupation   exercise, cycling   Stability/Clinical Decision Making Evolving/Moderate complexity    Rehab Potential Good    PT Frequency 2x / week   1-2x a week   PT Duration 12 weeks    PT Treatment/Interventions Manual techniques;Therapeutic exercise;Gait training;Neuromuscular re-education;Aquatic Therapy;Orthotic Fit/Training    PT Next Visit Plan continue with strengthening, standing balance with decreased UE support with eyes closed/head motions. SciFit for strengthening and aerobic activity. Gait over/around objects, gait distance and gait on outdoor surfaces    PT Home Exercise Plan [id number]    Consulted and Agree with Plan of Care Patient;Family member/caregiver    Family Member Consulted pt's wife and daughter.             Patient will benefit from skilled therapeutic intervention in order to improve the following deficits and impairments:  Decreased activity tolerance, Decreased coordination, Decreased balance, Decreased endurance, Decreased range of motion, Decreased strength, Difficulty walking, Impaired sensation, Postural dysfunction  Visit Diagnosis: Muscle weakness (generalized)  Difficulty in walking, not elsewhere classified  Other symptoms and signs involving the nervous system  Abnormal posture     Problem List Patient Active Problem List   Diagnosis Date Noted   Acute on chronic respiratory failure with hypoxia (HCC)    Guillain Barr syndrome ([place])    Paroxysmal atrial fibrillation ([place])    Autonomic dysfunction    Severe sepsis [facility])     Willow Ora, PTA 05/17/2021, 10:51 PM  [facility] [address] [place], [place], [address] Phone: [phone]   Fax:  [phone]  Name: Joseph Mayo MRN: [id number] Date of Birth: 03-09-1946

## 2021-05-19 ENCOUNTER — Ambulatory Visit: Payer: No Typology Code available for payment source | Admitting: Physical Therapy

## 2021-05-19 ENCOUNTER — Encounter: Payer: Self-pay | Admitting: Physical Therapy

## 2021-05-19 ENCOUNTER — Other Ambulatory Visit: Payer: Self-pay

## 2021-05-19 DIAGNOSIS — M6281 Muscle weakness (generalized): Secondary | ICD-10-CM | POA: Diagnosis not present

## 2021-05-19 DIAGNOSIS — R29818 Other symptoms and signs involving the nervous system: Secondary | ICD-10-CM

## 2021-05-19 DIAGNOSIS — R262 Difficulty in walking, not elsewhere classified: Secondary | ICD-10-CM

## 2021-05-19 NOTE — Therapy (Signed)
[facility] [address] [place], [place], [address] Phone: [phone]   Fax:  [phone]  Physical Therapy Treatment  Patient Details  Name: Joseph Mayo MRN: [id number] Date of Birth: 1946-07-31 Referring Provider (PT): Greer Pickerel A   Encounter Date: 05/19/2021   PT End of Session - 05/19/21 1107     Visit Number 41    Number of Visits 33    Date for PT Re-Evaluation 06/14/21    Authorization Type [facility]    Authorization Time Period 15 visits through 04/28/21; 15 visits from 03/24/21-07/22/21    Authorization - Visit Number 11    Authorization - Number of Visits 15    PT Start Time 1105    PT Stop Time 1145    PT Time Calculation (min) 40 min    Equipment Utilized During Treatment Gait belt    Activity Tolerance Patient tolerated treatment well    Behavior During Therapy WFL for tasks assessed/performed             Past Medical History:  Diagnosis Date   Acute on chronic respiratory failure with hypoxia (HCC)    Autonomic dysfunction    Guillain Barr syndrome ([place])    Paroxysmal atrial fibrillation ([place])    Severe sepsis ([place])     History reviewed. No pertinent surgical history.  There were no vitals filed for this visit.   Subjective Assessment - 05/19/21 1107     Subjective No new complaints. No falls or pain to report.    Patient is accompained by: Family member   spouse   Pertinent History GBS, a fib, hx of covid    Limitations Standing;Walking;Sitting    How long can you stand comfortably? about 1-2 minutes at the countertop with daughter assisting.    Patient Stated Goals wants to get up and walk.    Currently in Pain? No/denies                   [facility] Adult PT Treatment/Exercise - 05/19/21 1108       Transfers   Transfers Sit to Stand;Stand to Sit    Sit to Stand 5: Supervision;With upper extremity assist;From bed;From chair/3-in-1    Stand to Sit 5: Supervision;With  upper extremity assist;To bed;To chair/3-in-1      Ambulation/Gait   Ambulation/Gait Yes    Ambulation/Gait Assistance 5: Supervision    Ambulation Distance (Feet) --   around gym with session   Assistive device Rolling walker;Other (Comment)   bil braces   Gait Pattern Step-through pattern;Decreased stride length;Decreased step length - right;Decreased step length - left;Decreased dorsiflexion - right;Decreased dorsiflexion - left;Right steppage;Left steppage;Narrow base of support    Ambulation Surface Level;Indoor      High Level Balance   High Level Balance Activities Backward walking;Other (comment)    High Level Balance Comments in parallel bars with no UE support, occasional touch to bars for forward<>backward gait for 3 laps each way, min guard to min assist. pt with increased fluidity as reps progressed with improved step length and arm swing.      Knee/Hip Exercises: Aerobic   Other Aerobic [place] UE/LE's (seat 20 and arms 9) on level 4.3 x 8  minutes with goal >/= 60 steps per minute for strengthening and activity tolerance                 Balance Exercises - 05/19/21 1112       Balance Exercises: Standing   Balance  Beam standing across blue foam beam: alternating forward stepping to floor/back onto beam, then alternating backward stepping to floor/back onto beam for ~10 reps each. light UE support on bars with cues for step length/height.    Tandem Gait Forward;Retro;Upper extremity support;Foam/compliant surface;3 reps;Limitations    Tandem Gait Limitations on blue foam beam for 3 laps each way with light UE support. cues on posture and step placement on beam.    Sidestepping Foam/compliant support;Upper extremity support;3 reps;Limitations    Sidestepping Limitations blue foam beam- 3 laps each way with light UE support on bars, cues on posture and step length/placement.                  PT Short Term Goals - 05/03/21 2002       PT SHORT TERM GOAL #1    Title Pt will perform 4 steps with B handrails and step to pattern with min guard in order to demo improved functional mobility. ALL STGS DUE 04/26/21    Baseline step to with supervision, min guard with step through    Time 4    Period Weeks    Status Achieved    Target Date 04/26/21      PT SHORT TERM GOAL #2   Title Pt will improve gait speed to at least 1.5 ft/sec with RW and B AFOs in order to demo improved gait efficiency.    Baseline 1.31 ft/sec with RW; 1.74 ft/sec    Time 4    Period Weeks    Status Achieved      PT SHORT TERM GOAL #3   Title Pt will decr TUG time to 20 seconds or less with RW and B AFOs in order to demo decr fall risk.    Baseline 24.94 seconds; 18.78 seconds on 04/23/21    Time 4    Period Weeks    Status Achieved      PT SHORT TERM GOAL #4   Title Pt will undergo further assessment of 5x sit <> stand with LTG written as appropriate in order to demo improved balance and functional strength.    Baseline 18.44 seconds on 04/28/21 - LTG written    Time 4    Period Weeks    Status Achieved      PT SHORT TERM GOAL #5   Title Pt will ambulate at least 300' outdoors with RW and B AFOs with supervision in order to demo improved community mobility.    Baseline met 500' with supervision and one episode of min guard    Time 4    Period Weeks    Status Achieved               PT Long Term Goals - 05/03/21 2002       PT LONG TERM GOAL #1   Title Pt and pt's caregiver will be independent with final HEP in order to build upon functional gains made in therapy. ALL LTGS DUE 05/24/21    Baseline 03/19/21: met with current program- will continue to benefit from updates    Time 8   due to delay in scheduling   Period Weeks    Status New      PT LONG TERM GOAL #2   Title Pt will improve BERG score to at least a 30/56 in order to demo decr fall risk.    Baseline 23/56    Time 8    Period Weeks    Status New  PT LONG TERM GOAL #3   Title Pt will ambulate at  least 800' outdoors with RW and B AFOs and perform a curb with supervision in order to demo improved community mobility.    Time 8    Period Weeks    Status New      PT LONG TERM GOAL #4   Title Pt will improve 5x sit <> stand to 16 seconds or less from mat table with UE support in order to demo improved functional BLE strength for transfers.    Baseline 18.44 seconds on 04/28/21    Time 8    Period Weeks    Status Revised      PT LONG TERM GOAL #5   Title Pt will improve gait speed to at least 1.8 ft/sec with RW and B AFOs in order to demo improved gait efficiency.    Baseline 1.31 ft/sec with RW    Time 8    Period Weeks    Status New                   Plan - 05/19/21 1108     Clinical Impression Statement Today's skilled session continued to focus on strengthening, balance training and began to address gait with decreased UE support on solid surfaces (has been working on this in the aquatic setting). No issues noted or reported in session. Pt was able to walk a few laps in parallel bars with no UE support this session with min guard to min assist. The pt is making steady progress toward goals and should benefit from continued PT to progress toward unmet goals.    Personal Factors and Comorbidities Comorbidity 3+;Past/Current Experience;Fitness   pt very active prior to diagnosis   Comorbidities GBS, a fib, hx of covid.    Examination-Activity Limitations Bathing;Bed Mobility;Caring for [employer];Locomotion Level;Dressing;Hygiene/Grooming;Transfers;Stand;Toileting    Examination-Participation Restrictions Cleaning;Community Activity;Yard Work;Meal Prep;Shop;Laundry;Driving;Occupation   exercise, cycling   Stability/Clinical Decision Making Evolving/Moderate complexity    Rehab Potential Good    PT Frequency 2x / week   1-2x a week   PT Duration 12 weeks    PT Treatment/Interventions Manual techniques;Therapeutic exercise;Gait training;Neuromuscular  re-education;Aquatic Therapy;Orthotic Fit/Training    PT Next Visit Plan on land ? gait with cane, continued to work on strengthing and balance. LTGs due 05/24/21.    PT Home Exercise Plan [id number]    Consulted and Agree with Plan of Care Patient;Family member/caregiver    Family Member Consulted pt's wife and daughter.             Patient will benefit from skilled therapeutic intervention in order to improve the following deficits and impairments:  Decreased activity tolerance, Decreased coordination, Decreased balance, Decreased endurance, Decreased range of motion, Decreased strength, Difficulty walking, Impaired sensation, Postural dysfunction  Visit Diagnosis: Muscle weakness (generalized)  Difficulty in walking, not elsewhere classified  Other symptoms and signs involving the nervous system     Problem List Patient Active Problem List   Diagnosis Date Noted   Acute on chronic respiratory failure with hypoxia ([place])    Guillain Barr syndrome ([place])    Paroxysmal atrial fibrillation ([place])    Autonomic dysfunction    Severe sepsis [facility])     Willow Ora, PTA 05/19/2021, 4:37 PM  [facility] [address] [place], [place], [address] Phone: [phone]   Fax:  [phone]  Name: Joseph Mayo MRN: [id number] Date of Birth: 1946/06/20

## 2021-05-24 ENCOUNTER — Ambulatory Visit: Payer: No Typology Code available for payment source | Admitting: Physical Therapy

## 2021-05-26 ENCOUNTER — Ambulatory Visit: Payer: No Typology Code available for payment source | Attending: Family Medicine | Admitting: Physical Therapy

## 2021-05-26 ENCOUNTER — Other Ambulatory Visit: Payer: Self-pay

## 2021-05-26 ENCOUNTER — Encounter: Payer: Self-pay | Admitting: Physical Therapy

## 2021-05-26 DIAGNOSIS — R29818 Other symptoms and signs involving the nervous system: Secondary | ICD-10-CM | POA: Insufficient documentation

## 2021-05-26 DIAGNOSIS — R293 Abnormal posture: Secondary | ICD-10-CM | POA: Insufficient documentation

## 2021-05-26 DIAGNOSIS — R208 Other disturbances of skin sensation: Secondary | ICD-10-CM | POA: Insufficient documentation

## 2021-05-26 DIAGNOSIS — M6281 Muscle weakness (generalized): Secondary | ICD-10-CM | POA: Diagnosis present

## 2021-05-26 DIAGNOSIS — R262 Difficulty in walking, not elsewhere classified: Secondary | ICD-10-CM | POA: Diagnosis present

## 2021-05-26 NOTE — Therapy (Addendum)
[facility] [address] [place], [place], [address] Phone: [phone]   Fax:  [phone]  Physical Therapy Treatment  Patient Details  Name: Joseph Mayo MRN: [id number] Date of Birth: [birth year] Referring Provider (PT): Greer Pickerel A   Encounter Date: 05/26/2021   PT End of Session - 05/26/21 1225     Visit Number 42    Number of Visits 47    Date for PT Re-Evaluation 06/14/21    Authorization Type [facility]    Authorization Time Period 15 visits through 04/28/21; 15 visits from 03/24/21-07/22/21    Authorization - Visit Number 12    Authorization - Number of Visits 15    PT Start Time 1102    PT Stop Time 1145    PT Time Calculation (min) 43 min    Equipment Utilized During Treatment Gait belt    Activity Tolerance Patient tolerated treatment well    Behavior During Therapy WFL for tasks assessed/performed             Past Medical History:  Diagnosis Date   Acute on chronic respiratory failure with hypoxia (HCC)    Autonomic dysfunction    Guillain Barr syndrome ([place])    Paroxysmal atrial fibrillation ([place])    Severe sepsis ([place])     History reviewed. No pertinent surgical history.  There were no vitals filed for this visit.   Subjective Assessment - 05/26/21 1107     Subjective Tried walking around the house with 2 walking poles. No falls. Reports that he feels like his balance is getting better.    Patient is accompained by: Family member   spouse   Pertinent History GBS, a fib, hx of covid    Limitations Standing;Walking;Sitting    How long can you stand comfortably? about 1-2 minutes at the countertop with daughter assisting.    Patient Stated Goals wants to get up and walk.    Currently in Pain? No/denies                               [facility] Adult PT Treatment/Exercise - 05/26/21 1109       Transfers   Transfers Sit to Stand;Stand to Sit    Sit to Stand 5:  Supervision;With upper extremity assist;From bed;From chair/3-in-1    Five time sit to stand comments  22.47 seconds from mat table with UE support    Stand to Sit 5: Supervision;With upper extremity assist;To bed;To chair/3-in-1      Ambulation/Gait   Ambulation/Gait Yes    Ambulation/Gait Assistance 5: Supervision;4: Min guard    Ambulation/Gait Assistance Details Gait with RW into and out of session and with gait speed, remainder of session worked on gait with SPC with quad tip with pt needing min guard. Initial cues for proper sequencing with pt able to pick it up well. No LOB. Pt holding cane in RUE    Ambulation Distance (Feet) 230 Feet   x2   Assistive device Rolling walker;Straight cane   SPC with 4 prong tip   Gait Pattern Step-through pattern;Decreased stride length;Decreased step length - right;Decreased step length - left;Decreased dorsiflexion - right;Decreased dorsiflexion - left;Right steppage;Left steppage;Narrow base of support    Ambulation Surface Level;Indoor    Gait velocity 15.62 seconds = 2.10 ft/sec   with RW   Gait Comments in // bars: down and back x2 reps with SPC with quad tip  05/26/21 0001  Balance Exercises: Standing  SLS with Vectors Solid surface;Upper extremity assist 1;Limitations  SLS with Vectors Limitations Alternating foot taps x10 reps B to 6" step, needing UE support, tried a couple reps without with pt needing min guard/reaching out to bars for balance at times.  Standing, One Foot on a Step Eyes open;6 inch;Limitations  Standing, One Foot on a Step Limitations with LLE as stance leg, cues for glute activation, holding with BUE support and episodes without. performed  3 x 15 seconds bilat  Step Ups Forward;UE support 2;6 inch;Limitations  Step Ups Limitations x10 reps bilat, alternating legs  Other Standing Exercises Forwards gait and retro gait in // bars with no UE support x4 reps, min guard when stepping posteriorly            PT Education - 05/26/21 1225     Education Details Discussed POC going forwards - PT to request more visits from [place] and pt to continue with POC with 2x a week (1 in clinic, 1 in pool)    Person(s) Educated Patient;Spouse    Methods Explanation    Comprehension Verbalized understanding              PT Short Term Goals - 05/03/21 2002       PT SHORT TERM GOAL #1   Title Pt will perform 4 steps with B handrails and step to pattern with min guard in order to demo improved functional mobility. ALL STGS DUE 04/26/21    Baseline step to with supervision, min guard with step through    Time 4    Period Weeks    Status Achieved    Target Date 04/26/21      PT SHORT TERM GOAL #2   Title Pt will improve gait speed to at least 1.5 ft/sec with RW and B AFOs in order to demo improved gait efficiency.    Baseline 1.31 ft/sec with RW; 1.74 ft/sec    Time 4    Period Weeks    Status Achieved      PT SHORT TERM GOAL #3   Title Pt will decr TUG time to 20 seconds or less with RW and B AFOs in order to demo decr fall risk.    Baseline 24.94 seconds; 18.78 seconds on 04/23/21    Time 4    Period Weeks    Status Achieved      PT SHORT TERM GOAL #4   Title Pt will undergo further assessment of 5x sit <> stand with LTG written as appropriate in order to demo improved balance and functional strength.    Baseline 18.44 seconds on 04/28/21 - LTG written    Time 4    Period Weeks    Status Achieved      PT SHORT TERM GOAL #5   Title Pt will ambulate at least 300' outdoors with RW and B AFOs with supervision in order to demo improved community mobility.    Baseline met 500' with supervision and one episode of min guard    Time 4    Period Weeks    Status Achieved               PT Long Term Goals - 05/26/21 1115       PT LONG TERM GOAL #1   Title Pt and pt's caregiver will be independent with final HEP in order to build upon functional gains made in therapy. ALL LTGS DUE  05/24/21    Baseline  03/19/21: met with current program- will continue to benefit from updates    Time 8   due to delay in scheduling   Period Weeks    Status New      PT LONG TERM GOAL #2   Title Pt will improve BERG score to at least a 30/56 in order to demo decr fall risk.    Baseline 23/56    Time 8    Period Weeks    Status New      PT LONG TERM GOAL #3   Title Pt will ambulate at least 800' outdoors with RW and B AFOs and perform a curb with supervision in order to demo improved community mobility.    Time 8    Period Weeks    Status New      PT LONG TERM GOAL #4   Title Pt will improve 5x sit <> stand to 16 seconds or less from mat table with UE support in order to demo improved functional BLE strength for transfers.    Baseline 18.44 seconds on 04/28/21, 22.47 on 05/26/21    Time 8    Period Weeks    Status Not Met      PT LONG TERM GOAL #5   Title Pt will improve gait speed to at least 1.8 ft/sec with RW and B AFOs in order to demo improved gait efficiency.    Baseline 1.31 ft/sec with RW; 15.62 seconds = 2.10 ft/sec    Time 8    Period Weeks    Status Achieved                   Plan - 05/27/21 0804     Clinical Impression Statement Began to check pt's LTGs today with pt meeting LTG #5 in regards to gait speed with RW. Performed in 2.10 ft/sec today, indicating that pt is a decr fall risk. Pt did not meet LTG #4 in regards to 5x sit <> stand, did perform with a slow time today with UE support from mat table, but did demonstrate improved eccentric control. Goal date updated for remainder of goals due to 12 weeks in POC. Initiated gait today with SPC with quad tip with pt needing min guard for safety. No LOB noted. Pt able to pick up sequencing easily with cane. Pt is making excellent progress with PT, will continue to progress towards LTGs.    Personal Factors and Comorbidities Comorbidity 3+;Past/Current Experience;Fitness   pt very active prior to diagnosis    Comorbidities GBS, a fib, hx of covid.    Examination-Activity Limitations Bathing;Bed Mobility;Caring for [employer];Locomotion Level;Dressing;Hygiene/Grooming;Transfers;Stand;Toileting    Examination-Participation Restrictions Cleaning;Community Activity;Yard Work;Meal Prep;Shop;Laundry;Driving;Occupation   exercise, cycling   Stability/Clinical Decision Making Evolving/Moderate complexity    Rehab Potential Good    PT Frequency 2x / week   1-2x a week   PT Duration 12 weeks    PT Treatment/Interventions Manual techniques;Therapeutic exercise;Gait training;Neuromuscular re-education;Aquatic Therapy;Orthotic Fit/Training    PT Next Visit Plan gait with cane, continued to work on strengthing and balance. any word from [place]? Will need recert at end of current visits    PT [place] and Agree with Plan of Care Patient;Family member/caregiver    Family Member Consulted pt's wife and daughter.             Patient will benefit from skilled therapeutic intervention in order to improve the following deficits and impairments:  Decreased activity tolerance, Decreased  coordination, Decreased balance, Decreased endurance, Decreased range of motion, Decreased strength, Difficulty walking, Impaired sensation, Postural dysfunction  Visit Diagnosis: Muscle weakness (generalized)  Difficulty in walking, not elsewhere classified  Other symptoms and signs involving the nervous system  Abnormal posture     Problem List Patient Active Problem List   Diagnosis Date Noted   Acute on chronic respiratory failure with hypoxia (HCC)    Guillain Barr syndrome ([place])    Paroxysmal atrial fibrillation ([place])    Autonomic dysfunction    Severe sepsis ([place])     Arliss Journey, PT, DPT  05/27/2021, 8:07 AM  [facility] [address] [place], [place], [address] Phone: [phone]   Fax:   [phone]  Name: Joseph Mayo MRN: [id number] Date of Birth: 1946/05/19

## 2021-05-31 ENCOUNTER — Encounter: Payer: Self-pay | Admitting: Physical Therapy

## 2021-05-31 ENCOUNTER — Other Ambulatory Visit: Payer: Self-pay

## 2021-05-31 ENCOUNTER — Ambulatory Visit: Payer: No Typology Code available for payment source | Admitting: Physical Therapy

## 2021-05-31 DIAGNOSIS — R262 Difficulty in walking, not elsewhere classified: Secondary | ICD-10-CM

## 2021-05-31 DIAGNOSIS — M6281 Muscle weakness (generalized): Secondary | ICD-10-CM | POA: Diagnosis not present

## 2021-05-31 DIAGNOSIS — R293 Abnormal posture: Secondary | ICD-10-CM

## 2021-05-31 DIAGNOSIS — R208 Other disturbances of skin sensation: Secondary | ICD-10-CM

## 2021-05-31 DIAGNOSIS — R29818 Other symptoms and signs involving the nervous system: Secondary | ICD-10-CM

## 2021-05-31 NOTE — Therapy (Signed)
[facility] [address] [place], [place], [address] Phone: [phone]   Fax:  [phone]  Physical Therapy Treatment  Patient Details  Name: Joseph Mayo MRN: [id number] Date of Birth: 19-Jan-1946 Referring Provider (PT): Greer Pickerel A   Encounter Date: 05/31/2021   PT End of Session - 05/31/21 1244     Visit Number 43    Number of Visits 46    Date for PT Re-Evaluation 06/14/21    Authorization Type [facility]    Authorization Time Period 15 visits through 04/28/21; 15 visits from 03/24/21-07/22/21    Authorization - Visit Number 13    Authorization - Number of Visits 15    PT Start Time 9485    PT Stop Time 1230    PT Time Calculation (min) 45 min    Equipment Utilized During Treatment Other (comment)   ankle weights, single bar bells and pool noodle   Activity Tolerance Patient tolerated treatment well    Behavior During Therapy [facility] for tasks assessed/performed             Past Medical History:  Diagnosis Date   Acute on chronic respiratory failure with hypoxia (HCC)    Autonomic dysfunction    Guillain Barr syndrome ([place])    Paroxysmal atrial fibrillation ([place])    Severe sepsis ([place])     History reviewed. No pertinent surgical history.  There were no vitals filed for this visit.   Subjective Assessment - 05/31/21 1243     Subjective No new complaints. No falls or pain to report. Continues to work on yard, was using tractor to pull trees down with family last week. Took it easy this weekend.    Patient is accompained by: Family member   spouse   Pertinent History GBS, a fib, hx of covid    Limitations Standing;Walking;Sitting    How long can you stand comfortably? about 1-2 minutes at the countertop with daughter assisting.    Patient Stated Goals wants to get up and walk.    Currently in Pain? No/denies             Aquatic therapy at [place] temp 92 degrees   Patient seen for  aquatic therapy today.  Treatment took place in water 3.5-4.5 feet deep depending upon activity.  Pt entered and  Exited the pool via step negotiation with use of bil. Hands rails, step by step with descension and with ascension for exiting the pool.    Gait/dynamic gait: in ~4.5 foot water. Supervision to min guard assist for balance.  Forward gait with no AD x 4 laps Backward gait with no AD x 4 laps Side stepping x 2 laps each way with no AD. Forward toe walking x 4 laps with bil HHA for balance Forward tandem walking x 4 laps with bil HHA for balance   Stretching/strengthening At wall- runner's stretch for 30 sec holds x 3 on each side with cues on form/technique. With 2.5# ankle weights on: alternating hip abduction, alternating hip extension, alternating HS curls, and alternating high knee marching with bil UE support for balance. Cues for posture and ex form/technique.    Balance: With single bar bells: standing with feet hip width apart for simultaneous shoulder horizontal abduction/adduction at water level, shoulder adduction to hips/abduction to water level and then with arms down by hips moving bar bells in cross country skii motions. Min guard to min assist for balance. Progressing to forward gait with alternating  punching with bar bells at water level for 2 laps in ~4.5 foot water. Then lateral stepping while moving bar bells up to water level/down to hip level for 2 laps in ~4.5 foot water. Min guard to min assist. Cues on posture and abdominal bracing with all balance activities.    Pt requires buoyancy of water for support for reduced fall risk with gait training and balance exercises with minimal UE support; exercises able to be performed safely in water without the risk of fall compared to those same exercises performed on land;  viscosity of water needed for resistance for strengthening.  Current of water provides perturbations for challenging static & dynamic standing  balance.          PT Short Term Goals - 05/03/21 2002       PT SHORT TERM GOAL #1   Title Pt will perform 4 steps with B handrails and step to pattern with min guard in order to demo improved functional mobility. ALL STGS DUE 04/26/21    Baseline step to with supervision, min guard with step through    Time 4    Period Weeks    Status Achieved    Target Date 04/26/21      PT SHORT TERM GOAL #2   Title Pt will improve gait speed to at least 1.5 ft/sec with RW and B AFOs in order to demo improved gait efficiency.    Baseline 1.31 ft/sec with RW; 1.74 ft/sec    Time 4    Period Weeks    Status Achieved      PT SHORT TERM GOAL #3   Title Pt will decr TUG time to 20 seconds or less with RW and B AFOs in order to demo decr fall risk.    Baseline 24.94 seconds; 18.78 seconds on 04/23/21    Time 4    Period Weeks    Status Achieved      PT SHORT TERM GOAL #4   Title Pt will undergo further assessment of 5x sit <> stand with LTG written as appropriate in order to demo improved balance and functional strength.    Baseline 18.44 seconds on 04/28/21 - LTG written    Time 4    Period Weeks    Status Achieved      PT SHORT TERM GOAL #5   Title Pt will ambulate at least 300' outdoors with RW and B AFOs with supervision in order to demo improved community mobility.    Baseline met 500' with supervision and one episode of min guard    Time 4    Period Weeks    Status Achieved               PT Long Term Goals - 05/27/21 9292       PT LONG TERM GOAL #1   Title Pt and pt's caregiver will be independent with final HEP in order to build upon functional gains made in therapy. ALL LTGS DUE 06/24/21    Baseline 03/19/21: met with current program- will continue to benefit from updates    Time 12    Period Weeks    Status Revised    Target Date 06/24/21      PT LONG TERM GOAL #2   Title Pt will improve BERG score to at least a 30/56 in order to demo decr fall risk.    Baseline 23/56     Time 12    Period Weeks    Status New  PT LONG TERM GOAL #3   Title Pt will ambulate at least 800' outdoors with RW and B AFOs and perform a curb with supervision in order to demo improved community mobility.    Time 12    Period Weeks    Status New      PT LONG TERM GOAL #4   Title Pt will improve 5x sit <> stand to 16 seconds or less from mat table with UE support in order to demo improved functional BLE strength for transfers.    Baseline 18.44 seconds on 04/28/21, 22.47 on 05/26/21    Time 12    Period Weeks    Status Not Met      PT LONG TERM GOAL #5   Title Pt will improve gait speed to at least 1.8 ft/sec with RW and B AFOs in order to demo improved gait efficiency.    Baseline 1.31 ft/sec with RW; 15.62 seconds = 2.10 ft/sec    Time 12    Period Weeks    Status Achieved                   Plan - 05/31/21 1245     Clinical Impression Statement Today's skilled session continued to focus on strengthening, gait and balance in the aquatic setting. No issues noted or reported by patient. The pt is progressing toward goals and should benefit from continued PT to progress toward unmet goals.    Personal Factors and Comorbidities Comorbidity 3+;Past/Current Experience;Fitness   pt very active prior to diagnosis   Comorbidities GBS, a fib, hx of covid.    Examination-Activity Limitations Bathing;Bed Mobility;Caring for [employer];Locomotion Level;Dressing;Hygiene/Grooming;Transfers;Stand;Toileting    Examination-Participation Restrictions Cleaning;Community Activity;Yard Work;Meal Prep;Shop;Laundry;Driving;Occupation   exercise, cycling   Stability/Clinical Decision Making Evolving/Moderate complexity    Rehab Potential Good    PT Frequency 2x / week   1-2x a week   PT Duration 12 weeks    PT Treatment/Interventions Manual techniques;Therapeutic exercise;Gait training;Neuromuscular re-education;Aquatic Therapy;Orthotic Fit/Training    PT Next Visit Plan gait  with cane, continued to work on strengthing and balance. any word from [place]? Will need recert at end of current visits    PT [place] and Agree with Plan of Care Patient;Family member/caregiver    Family Member Consulted pt's wife and daughter.             Patient will benefit from skilled therapeutic intervention in order to improve the following deficits and impairments:  Decreased activity tolerance, Decreased coordination, Decreased balance, Decreased endurance, Decreased range of motion, Decreased strength, Difficulty walking, Impaired sensation, Postural dysfunction  Visit Diagnosis: Muscle weakness (generalized)  Difficulty in walking, not elsewhere classified  Other symptoms and signs involving the nervous system  Abnormal posture  Other disturbances of skin sensation     Problem List Patient Active Problem List   Diagnosis Date Noted   Acute on chronic respiratory failure with hypoxia ([place])    Guillain Barr syndrome ([place])    Paroxysmal atrial fibrillation ([place])    Autonomic dysfunction    Severe sepsis ([place])     Willow Ora, PTA, [facility] [address] [place],  [address] [phone] 05/31/21, 12:59 PM   Name: ROLANDO HESSLING MRN: [id number] Date of Birth: 1945-12-14

## 2021-06-02 ENCOUNTER — Other Ambulatory Visit: Payer: Self-pay

## 2021-06-02 ENCOUNTER — Ambulatory Visit: Payer: No Typology Code available for payment source | Admitting: Physical Therapy

## 2021-06-02 DIAGNOSIS — M6281 Muscle weakness (generalized): Secondary | ICD-10-CM

## 2021-06-02 DIAGNOSIS — R29818 Other symptoms and signs involving the nervous system: Secondary | ICD-10-CM

## 2021-06-02 DIAGNOSIS — R262 Difficulty in walking, not elsewhere classified: Secondary | ICD-10-CM

## 2021-06-02 NOTE — Therapy (Signed)
[facility] [address] [place], [place], [address] Phone: [phone]   Fax:  [phone]  Physical Therapy Treatment  Patient Details  Name: Joseph Mayo MRN: [id number] Date of Birth: 1946/05/01 Referring Provider (PT): Greer Pickerel A   Encounter Date: 06/02/2021   PT End of Session - 06/02/21 1104     Visit Number 21    Number of Visits 50    Date for PT Re-Evaluation 06/14/21    Authorization Type VA    Authorization Time Period 15 visits through 04/28/21; 15 visits from 03/24/21-07/22/21    Authorization - Visit Number 14    Authorization - Number of Visits 15    PT Start Time 1101    PT Stop Time 1142    PT Time Calculation (min) 41 min    Equipment Utilized During Treatment Gait belt    Activity Tolerance Patient tolerated treatment well    Behavior During Therapy WFL for tasks assessed/performed             Past Medical History:  Diagnosis Date   Acute on chronic respiratory failure with hypoxia (HCC)    Autonomic dysfunction    Guillain Barr syndrome ([place])    Paroxysmal atrial fibrillation ([place])    Severe sepsis ([place])     No past surgical history on file.  There were no vitals filed for this visit.   Subjective Assessment - 06/02/21 1104     Subjective Worked on his Lucianne Lei this past week.    Patient is accompained by: Family member   spouse   Pertinent History GBS, a fib, hx of covid    Limitations Standing;Walking;Sitting    How long can you stand comfortably? about 1-2 minutes at the countertop with daughter assisting.    Patient Stated Goals wants to get up and walk.    Currently in Pain? No/denies                [facility] PT Assessment - 06/02/21 1107       Berg Balance Test   Sit to Stand Able to stand  independently using hands    Standing Unsupported Able to stand safely 2 minutes    Sitting with Back Unsupported but Feet Supported on Floor or Stool Able to sit safely  and securely 2 minutes    Stand to Sit Sits safely with minimal use of hands    Transfers Able to transfer safely, definite need of hands    Standing Unsupported with Eyes Closed Able to stand 3 seconds   posterior balance loss at 4 seconds   Standing Unsupported with Feet Together Needs help to attain position but able to stand for 30 seconds with feet together    From Standing, Reach Forward with Outstretched Arm Can reach forward >12 cm safely (5")    From Standing Position, Pick up Object from Floor Unable to try/needs assist to keep balance    From Standing Position, Turn to Look Behind Over each Shoulder Needs supervision when turning    Turn 360 Degrees Needs assistance while turning    Standing Unsupported, Alternately Place Feet on Step/Stool Needs assistance to keep from falling or unable to try    Standing Unsupported, One Foot in Front Needs help to step but can hold 15 seconds    Standing on One Leg Tries to lift leg/unable to hold 3 seconds but remains standing independently    Total Score 27    Berg comment: high risk  for falls                           [facility] PT Treatment/Exercise - 06/02/21 0001       Ambulation/Gait   Ambulation/Gait Yes    Ambulation/Gait Assistance 4: Min guard    Ambulation/Gait Assistance Details Small distances between activities    Ambulation Distance (Feet) 50 Feet   x2   Assistive device Straight cane   with quad tip   Gait Pattern Step-through pattern;Decreased stride length;Decreased step length - right;Decreased step length - left;Decreased dorsiflexion - right;Decreased dorsiflexion - left;Right steppage;Left steppage;Narrow base of support    Ambulation Surface Level;Indoor                 Balance Exercises - 06/02/21 1146       Balance Exercises: Standing   Standing Eyes Opened Solid surface    Standing Eyes Opened Limitations Reaching outside of BOS in multi directions to tap cone (held by PT tech) for  incr trunk rotation x12 reps each side, incr difficulty going to L, min guard for balance    Standing Eyes Closed Wide (BOA);Solid surface;Limitations    Standing Eyes Closed Limitations Wide BOS eyes closed - x5 seconds 1st rep, an additional 3 reps of 30 seconds with intermittent taps to bars for balance. With fingertip support as needed: 2 x 5 reps head turns, 2 x 5 reps head nods. Min guard/min A for balance    Rockerboard Anterior/posterior;EO;Limitations    Rockerboard Limitations Weight shifting with finger tip support x12 reps, keeping board steady 3 x 30 seconds with cues for posture, and hip strategy to help with balance, intermittent UE support as needed                PT Education - 06/02/21 1143     Education Details Scheduling more visits (will wait a couple weeks to allow for VA auth to come back), results of BERG    Person(s) Educated Spouse;Patient    Methods Explanation    Comprehension Verbalized understanding              PT Short Term Goals - 05/03/21 2002       PT SHORT TERM GOAL #1   Title Pt will perform 4 steps with B handrails and step to pattern with min guard in order to demo improved functional mobility. ALL STGS DUE 04/26/21    Baseline step to with supervision, min guard with step through    Time 4    Period Weeks    Status Achieved    Target Date 04/26/21      PT SHORT TERM GOAL #2   Title Pt will improve gait speed to at least 1.5 ft/sec with RW and B AFOs in order to demo improved gait efficiency.    Baseline 1.31 ft/sec with RW; 1.74 ft/sec    Time 4    Period Weeks    Status Achieved      PT SHORT TERM GOAL #3   Title Pt will decr TUG time to 20 seconds or less with RW and B AFOs in order to demo decr fall risk.    Baseline 24.94 seconds; 18.78 seconds on 04/23/21    Time 4    Period Weeks    Status Achieved      PT SHORT TERM GOAL #4   Title Pt will undergo further assessment of 5x sit <> stand with LTG written as  appropriate in  order to demo improved balance and functional strength.    Baseline 18.44 seconds on 04/28/21 - LTG written    Time 4    Period Weeks    Status Achieved      PT SHORT TERM GOAL #5   Title Pt will ambulate at least 300' outdoors with RW and B AFOs with supervision in order to demo improved community mobility.    Baseline met 500' with supervision and one episode of min guard    Time 4    Period Weeks    Status Achieved               PT Long Term Goals - 06/02/21 1123       PT LONG TERM GOAL #1   Title Pt and pt's caregiver will be independent with final HEP in order to build upon functional gains made in therapy. ALL LTGS DUE 06/24/21    Baseline verbally reviewed- pt reporting performing balance exercises intermittently    Time 12    Period Weeks    Status Partially Met      PT LONG TERM GOAL #2   Title Pt will improve BERG score to at least a 30/56 in order to demo decr fall risk.    Baseline 23/56; 27/56    Time 12    Period Weeks    Status Not Met      PT LONG TERM GOAL #3   Title Pt will ambulate at least 800' outdoors with RW and B AFOs and perform a curb with supervision in order to demo improved community mobility.    Time 12    Period Weeks    Status New      PT LONG TERM GOAL #4   Title Pt will improve 5x sit <> stand to 16 seconds or less from mat table with UE support in order to demo improved functional BLE strength for transfers.    Baseline 18.44 seconds on 04/28/21, 22.47 on 05/26/21    Time 12    Period Weeks    Status Not Met      PT LONG TERM GOAL #5   Title Pt will improve gait speed to at least 1.8 ft/sec with RW and B AFOs in order to demo improved gait efficiency.    Baseline 1.31 ft/sec with RW; 15.62 seconds = 2.10 ft/sec    Time 12    Period Weeks    Status Achieved                   Plan - 06/02/21 1149     Clinical Impression Statement Performed the BERG today with pt scoring a 27/56 today, indicating that pt is at a very high  risk for falls. Pt with improvement from before (23/56), but did not quite meet goal level. Pt with decr ankle strategy for balance. Remainder of session focused on small distance gait with SPC with quad tip with pt needing min guard and standing balance strategies working on decr UE support. Pt improving with eyes closed with incr reps. Does need min guard/min A at times for balance due to posterior balance loss. Will continue to progress towards LTGs. Awaiting VA auth for an additional 15 visits.    Personal Factors and Comorbidities Comorbidity 3+;Past/Current Experience;Fitness   pt very active prior to diagnosis   Comorbidities GBS, a fib, hx of covid.    Examination-Activity Limitations Bathing;Bed Mobility;Caring for [employer];Locomotion Level;Dressing;Hygiene/Grooming;Transfers;Stand;Toileting    Examination-Participation Restrictions  Cleaning;Community Activity;Yard Work;Meal Prep;Shop;Laundry;Driving;Occupation   exercise, cycling   Stability/Clinical Decision Making Evolving/Moderate complexity    Rehab Potential Good    PT Frequency 2x / week   1-2x a week   PT Duration 12 weeks    PT Treatment/Interventions Manual techniques;Therapeutic exercise;Gait training;Neuromuscular re-education;Aquatic Therapy;Orthotic Fit/Training    PT Next Visit Plan gait with cane, continued to work on strengthing and balance - ankle/hip/stepping strategiesand eyes closed/narrow BOS. any word from [place]? Will need recert at end of current visits    PT [place] and Agree with Plan of Care Patient;Family member/caregiver    Family Member Consulted pt's wife and daughter.             Patient will benefit from skilled therapeutic intervention in order to improve the following deficits and impairments:  Decreased activity tolerance, Decreased coordination, Decreased balance, Decreased endurance, Decreased range of motion, Decreased strength, Difficulty walking,  Impaired sensation, Postural dysfunction  Visit Diagnosis: Muscle weakness (generalized)  Difficulty in walking, not elsewhere classified  Other symptoms and signs involving the nervous system     Problem List Patient Active Problem List   Diagnosis Date Noted   Acute on chronic respiratory failure with hypoxia (HCC)    Guillain Barr syndrome ([place])    Paroxysmal atrial fibrillation ([place])    Autonomic dysfunction    Severe sepsis ([place])     Arliss Journey, PT, DPT  06/02/2021, 11:52 AM  [facility] [address] [place], [place], [address] Phone: [phone]   Fax:  [phone]  Name: Joseph Mayo MRN: [id number] Date of Birth: 13-Jan-1946

## 2021-06-07 ENCOUNTER — Ambulatory Visit: Payer: Self-pay | Admitting: Physical Therapy

## 2021-06-11 ENCOUNTER — Ambulatory Visit: Payer: No Typology Code available for payment source | Admitting: Physical Therapy

## 2021-06-18 ENCOUNTER — Encounter: Payer: Self-pay | Admitting: Physical Therapy

## 2021-06-18 ENCOUNTER — Other Ambulatory Visit: Payer: Self-pay

## 2021-06-18 ENCOUNTER — Ambulatory Visit: Payer: No Typology Code available for payment source | Admitting: Physical Therapy

## 2021-06-18 DIAGNOSIS — R262 Difficulty in walking, not elsewhere classified: Secondary | ICD-10-CM

## 2021-06-18 DIAGNOSIS — M6281 Muscle weakness (generalized): Secondary | ICD-10-CM | POA: Diagnosis not present

## 2021-06-18 DIAGNOSIS — R293 Abnormal posture: Secondary | ICD-10-CM

## 2021-06-18 DIAGNOSIS — R29818 Other symptoms and signs involving the nervous system: Secondary | ICD-10-CM

## 2021-06-18 NOTE — Therapy (Addendum)
[facility] [address] [place], [place], [address] Phone: [phone]   Fax:  [phone]  Physical Therapy Treatment/Re-Cert  Patient Details  Name: Joseph Mayo MRN: [id number] Date of Birth: Oct 08, 1945 Referring Provider (PT): Caprice Beaver, MD   Encounter Date: 06/18/2021    06/18/21 1542  PT Visits / Re-Eval  Visit Number 45  Number of Visits 60  Date for PT Re-Evaluation 09/16/2021  Authorization  Authorization Type VA  Authorization Time Period 15 visits through 04/28/21; 15 visits from 03/24/21-07/22/21, awaiting for additional [place] - Visit Number 15  Authorization - Number of Visits 15  PT Time Calculation  PT Start Time 2334 (PT running late)  PT Stop Time 1617  PT Time Calculation (min) 40 min  PT - End of Session  Equipment Utilized During Treatment Gait belt  Activity Tolerance Patient tolerated treatment well  Behavior During Therapy WFL for tasks assessed/performed     Past Medical History:  Diagnosis Date   Acute on chronic respiratory failure with hypoxia (HCC)    Autonomic dysfunction    Guillain Barr syndrome ([place])    Paroxysmal atrial fibrillation ([place])    Severe sepsis ([place])     History reviewed. No pertinent surgical history.  There were no vitals filed for this visit.   Subjective Assessment - 06/18/21 1539     Subjective Got a rollator from the [place]. Trying to walk more with his rollator. Was in [place] for the past week. No falls.    Patient is accompained by: Family member   spouse   Pertinent History GBS, a fib, hx of covid    Limitations Standing;Walking;Sitting    How long can you stand comfortably? about 1-2 minutes at the countertop with daughter assisting.    Patient Stated Goals wants to get up and walk.    Currently in Pain? No/denies                [facility] PT Assessment - 06/18/21 1555       Assessment   Medical Diagnosis GBS     Referring Provider (PT) Caprice Beaver, MD    Onset Date/Surgical Date 06/17/20    Hand Dominance Right      Prior Function   Level of Independence Independent;Independent with basic ADLs                            06/18/21 1556  Ambulation/Gait  Ambulation/Gait Yes  Ambulation/Gait Assistance 4: Min guard;5: Supervision  Ambulation/Gait Assistance Details Cues for using brakes down inclines - min guard for safety. Updated pt's new rollator to proper height. Pt able to ambulate 1000' with no rest breaks.  Ambulation Distance (Feet) 1000 Feet (bilat AFOs)  Assistive device 4-wheeled walker  Gait Pattern Step-through pattern;Decreased stride length;Decreased step length - right;Decreased step length - left;Decreased dorsiflexion - right;Decreased dorsiflexion - left;Right steppage;Left steppage;Narrow base of support  Ambulation Surface Level;Indoor;Unlevel;Outdoor;Grass;Paved       Access Code: [email or web address] URL: [email or web address] Date: 06/18/2021 Prepared by: Janann August  Reviewed HEP and added bolded exercises below. See MedBridge for more details.    Exercises Mini Squats with Walker and Chair - 1 x daily - 5 x weekly - 1-2 sets - 10 reps Single Leg Bridge - 1 x daily - 5 x weekly - 2 sets - 5 reps Clamshell with Resistance - 1 x daily - 5 x weekly - 2 sets - 10 reps  Standing March with Counter Support - 1 x daily - 5 x weekly - 2 sets - 10 reps Standing Balance in Corner with Eyes Closed - 1 x daily - 5 x weekly - 3 sets - 20 hold Standing with Head Rotation - 1 x daily - 5 x weekly - 2 sets - 10 reps Standing Hip Abduction with Counter Support - 1 x daily - 5 x weekly - 2 sets - 10 reps Standing Knee Flexion AROM with Chair Support - 1 x daily - 5 x weekly - 2 sets - 10 reps   Balance Exercises - 06/18/21 1614       Balance Exercises: Standing   Standing Eyes Opened Solid surface    Standing Eyes Opened Limitations In corner: more  narrow BOS: 2 x 5 reps head turns, 2 x 5 reps head nods, intermittent taps to walls/chair for balance   Standing Eyes Closed Wide (BOA);Solid surface;Limitations    Standing Eyes Closed Limitations Wide BOS eyes closed - x5 seconds 1st rep, an additional 3 reps of 30 seconds with intermittent taps to bars for balance. With fingertip support as needed: 2 x 5 reps head turns, 2 x 5 reps head nods. Min guard/min A for balance                PT Education - 06/18/21 1623     Education Details Progress towards goals, additions to HEP, awaiting to hear from the [place] for more auth    Person(s) Educated Patient;Spouse    Methods Explanation;Demonstration;Handout    Comprehension Verbalized understanding;Returned demonstration              PT Short Term Goals - 05/03/21 2002       PT SHORT TERM GOAL #1   Title Pt will perform 4 steps with B handrails and step to pattern with min guard in order to demo improved functional mobility. ALL STGS DUE 04/26/21    Baseline step to with supervision, min guard with step through    Time 4    Period Weeks    Status Achieved    Target Date 04/26/21      PT SHORT TERM GOAL #2   Title Pt will improve gait speed to at least 1.5 ft/sec with RW and B AFOs in order to demo improved gait efficiency.    Baseline 1.31 ft/sec with RW; 1.74 ft/sec    Time 4    Period Weeks    Status Achieved      PT SHORT TERM GOAL #3   Title Pt will decr TUG time to 20 seconds or less with RW and B AFOs in order to demo decr fall risk.    Baseline 24.94 seconds; 18.78 seconds on 04/23/21    Time 4    Period Weeks    Status Achieved      PT SHORT TERM GOAL #4   Title Pt will undergo further assessment of 5x sit <> stand with LTG written as appropriate in order to demo improved balance and functional strength.    Baseline 18.44 seconds on 04/28/21 - LTG written    Time 4    Period Weeks    Status Achieved      PT SHORT TERM GOAL #5   Title Pt will ambulate at least  300' outdoors with RW and B AFOs with supervision in order to demo improved community mobility.    Baseline met 500' with supervision and one episode of min guard  Time 4    Period Weeks    Status Achieved            revised STGs:  PT Short Term Goals - 06/25/21 1219       PT SHORT TERM GOAL #1   Title Pt will perform 4 steps with single handrail and step through pattern with supervision in order to demo improved functional mobility. ALL STGS DUE 07/16/21    Baseline with bilat handrails: step to with supervision, min guard with step through    Time 4    Period Weeks    Status Revised    Target Date 07/16/21      PT SHORT TERM GOAL #2   Title Pt will undergo further assessment of gait speed with cane with quad tip with LTG written as appropriate.    Time 4    Period Weeks    Status Achieved      PT SHORT TERM GOAL #3   Title Pt will ambulate at least 49' with SPC with quad tip and supervision over level surfaces in order to demo improved household mobility.    Time 4    Period Weeks    Status New               PT Long Term Goals - 06/18/21 1624       PT LONG TERM GOAL #1   Title Pt and pt's caregiver will be independent with final HEP in order to build upon functional gains made in therapy. ALL LTGS DUE 06/24/21    Baseline verbally reviewed- pt reporting performing balance exercises intermittently    Time 12    Period Weeks    Status Partially Met      PT LONG TERM GOAL #2   Title Pt will improve BERG score to at least a 30/56 in order to demo decr fall risk.    Baseline 23/56; 27/56    Time 12    Period Weeks    Status Not Met      PT LONG TERM GOAL #3   Title Pt will ambulate at least 800' outdoors with RW and B AFOs and perform a curb with supervision in order to demo improved community mobility.    Baseline met distance with rollator, min guard going down inclines, did not perform curb.    Time 12    Period Weeks    Status Partially Met      PT  LONG TERM GOAL #4   Title Pt will improve 5x sit <> stand to 16 seconds or less from mat table with UE support in order to demo improved functional BLE strength for transfers.    Baseline 18.44 seconds on 04/28/21, 22.47 on 05/26/21    Time 12    Period Weeks    Status Not Met      PT LONG TERM GOAL #5   Title Pt will improve gait speed to at least 1.8 ft/sec with RW and B AFOs in order to demo improved gait efficiency.    Baseline 1.31 ft/sec with RW; 15.62 seconds = 2.10 ft/sec    Time 12    Period Weeks    Status Achieved            Revised LTGs:  PT Long Term Goals - 06/25/21 1222       PT LONG TERM GOAL #1   Title Pt and pt's caregiver will be independent with final HEP in order to build upon functional gains  made in therapy. ALL LTGS DUE 08/13/21    Baseline verbally reviewed- pt reporting performing balance exercises intermittently    Time 8    Period Weeks    Status On-going    Target Date 08/13/21      PT LONG TERM GOAL #2   Title Pt will improve BERG score to at least a 32/56 in order to demo decr fall risk.    Baseline 23/56; 27/56    Time 8    Period Weeks    Status Revised      PT LONG TERM GOAL #3   Title Gait speed goal to be written with SPC with quad tip.    Time 8    Period Weeks    Status New      PT LONG TERM GOAL #4   Title Pt will improve 5x sit <> stand to 16 seconds or less from mat table with UE support in order to demo improved functional BLE strength for transfers.    Baseline 18.44 seconds on 04/28/21, 22.47 on 05/26/21    Time 8    Period Weeks    Status On-going      PT LONG TERM GOAL #5   Title Pt will improve gait speed to at least 2.65 ft/sec with RW and B AFOs in order to demo improved gait efficiency and improved community mobility.    Time 8    Period Weeks    Status Revised                  06/25/21 1212  Plan  Clinical Impression Statement Assessed remainder of pt's LTGs today. Pt partially met LTG #3 - able to  ambulate 1,000' outdoors with rollator (pt recently received from the [place]) with mainly supervision, but min guard when going down inclines with cues for brake management. Reviewed pt's HEP, as pt has not been performing corner balance as consistently, added standing balance/BLE strengthening to pt's HEP. Pt's BERG score performed at last session indicates that pt is at a very high risk for falls with pt with decr ankle strategy. In past sessions, able to progress gait over level indoor distances with bilat AFOs and SPC with quad tip with min guard. Pt is making excellent progress with PT, will continue to benefit from skilled PT in order to improve strength, balance, gait, endurance, functional mobility and to decr fall risk. STG/LTGS updated as appropriate. Awaiting auth from [place] for 15 additional visits.  Personal Factors and Comorbidities Comorbidity 3+;Past/Current Experience;Fitness (pt very active prior to diagnosis)  Comorbidities GBS, a fib, hx of covid.  Examination-Activity Limitations Bathing;Bed Mobility;Caring for [employer];Locomotion Level;Dressing;Hygiene/Grooming;Transfers;Stand;Toileting  Examination-Participation Restrictions Cleaning;Community Activity;Yard Work;Meal Prep;Shop;Laundry;Driving;Occupation (exercise, cycling)  Pt will benefit from skilled therapeutic intervention in order to improve on the following deficits Decreased activity tolerance;Decreased coordination;Decreased balance;Decreased endurance;Decreased range of motion;Decreased strength;Difficulty walking;Impaired sensation;Postural dysfunction  Stability/Clinical Decision Making Evolving/Moderate complexity  Rehab Potential Good  PT Frequency 2x / week (1-2x a week)  PT Duration 12 weeks  PT Treatment/Interventions Manual techniques;Therapeutic exercise;Gait training;Neuromuscular re-education;Aquatic Therapy;Orthotic Fit/Training  PT Next Visit Plan gait with cane with quad tip, continued to work on  strengthing and balance - ankle/hip/stepping strategiesand eyes closed/narrow BOS. any word from [place]?  PT Home Exercise Plan [email or web address]  Consulted and Agree with Plan of Care Patient;Family member/caregiver  Family Member Consulted pt's wife and daughter.       Patient will benefit from skilled therapeutic intervention in order to improve the following  deficits and impairments:     Visit Diagnosis: Muscle weakness (generalized)  Other symptoms and signs involving the nervous system  Abnormal posture  Difficulty in walking, not elsewhere classified     Problem List Patient Active Problem List   Diagnosis Date Noted   Acute on chronic respiratory failure with hypoxia (HCC)    Guillain Barr syndrome ([place])    Paroxysmal atrial fibrillation ([place])    Autonomic dysfunction    Severe sepsis ([place])     Arliss Journey, PT, DPT  06/18/2021, 4:25 PM  [facility] [address] [place], [place], [address] Phone: [phone]   Fax:  [phone]  Name: Joseph Mayo MRN: [id number] Date of Birth: 08/20/1946

## 2021-06-18 NOTE — Patient Instructions (Signed)
Access Code: [email or web address] URL: [email or web address] Date: 06/18/2021 Prepared by: Sherlie Ban  Exercises Mini Squats with Dan Humphreys and Chair - 1 x daily - 5 x weekly - 1-2 sets - 10 reps Single Leg Bridge - 1 x daily - 5 x weekly - 2 sets - 5 reps Clamshell with Resistance - 1 x daily - 5 x weekly - 2 sets - 10 reps Standing March with Counter Support - 1 x daily - 5 x weekly - 2 sets - 10 reps Standing Balance in Corner with Eyes Closed - 1 x daily - 5 x weekly - 3 sets - 20 hold Standing with Head Rotation - 1 x daily - 5 x weekly - 2 sets - 10 reps Standing Hip Abduction with Counter Support - 1 x daily - 5 x weekly - 2 sets - 10 reps Standing Knee Flexion AROM with Chair Support - 1 x daily - 5 x weekly - 2 sets - 10 reps

## 2021-06-25 NOTE — Addendum Note (Signed)
Addended by: Drake Leach on: 06/25/2021 12:25 PM   Modules accepted: Orders

## 2021-06-30 ENCOUNTER — Ambulatory Visit: Payer: No Typology Code available for payment source | Admitting: Physical Therapy

## 2021-07-07 ENCOUNTER — Ambulatory Visit: Payer: No Typology Code available for payment source | Admitting: Physical Therapy

## 2021-07-12 ENCOUNTER — Ambulatory Visit: Payer: No Typology Code available for payment source | Attending: Family Medicine | Admitting: Physical Therapy

## 2021-07-12 DIAGNOSIS — R208 Other disturbances of skin sensation: Secondary | ICD-10-CM | POA: Diagnosis not present

## 2021-07-12 DIAGNOSIS — R262 Difficulty in walking, not elsewhere classified: Secondary | ICD-10-CM | POA: Insufficient documentation

## 2021-07-12 DIAGNOSIS — R29818 Other symptoms and signs involving the nervous system: Secondary | ICD-10-CM | POA: Diagnosis present

## 2021-07-12 DIAGNOSIS — R293 Abnormal posture: Secondary | ICD-10-CM | POA: Insufficient documentation

## 2021-07-12 DIAGNOSIS — M6281 Muscle weakness (generalized): Secondary | ICD-10-CM | POA: Insufficient documentation

## 2021-07-13 ENCOUNTER — Other Ambulatory Visit: Payer: Self-pay

## 2021-07-13 ENCOUNTER — Encounter: Payer: Self-pay | Admitting: Physical Therapy

## 2021-07-13 NOTE — Therapy (Signed)
[facility] [address] [place], [place], [address] Phone: [phone]   Fax:  [phone]  Physical Therapy Treatment  Patient Details  Name: Joseph Mayo MRN: [id number] Date of Birth: July 30, 1946 Referring Provider (PT): Sim Boast, MD   Encounter Date: 07/12/2021   PT End of Session - 07/12/21 1700     Visit Number 46    Number of Visits 60    Date for PT Re-Evaluation 09/16/21    Authorization Type [place]    Authorization Time Period 15 visits through 04/28/21; 15 visits from 03/24/21-07/22/21; 15 visits    Authorization - Visit Number 1    Authorization - Number of Visits 15    PT Start Time 1100    PT Stop Time 1141    PT Time Calculation (min) 41 min    Equipment Utilized During Treatment Other (comment)   pool noods, single bar bell, aquatic step   Activity Tolerance Patient tolerated treatment well    Behavior During Therapy WFL for tasks assessed/performed             Past Medical History:  Diagnosis Date   Acute on chronic respiratory failure with hypoxia (HCC)    Autonomic dysfunction    Guillain Barr syndrome (HCC)    Paroxysmal atrial fibrillation (HCC)    Severe sepsis (HCC)     History reviewed. No pertinent surgical history.  There were no vitals filed for this visit.   Subjective Assessment - 07/12/21 1700     Subjective No new complaints. Returns to aquatic therapy after [place] auth recieved. No falls or pain to report.    Patient is accompained by: Family member   spouse, daughter and grandson   Pertinent History GBS, a fib, hx of covid    Limitations Standing;Walking;Sitting    How long can you stand comfortably? about 1-2 minutes at the countertop with daughter assisting.    Patient Stated Goals wants to get up and walk.    Currently in Pain? No/denies            Aquatic therapy at [facility] - pool temp 94 degrees   Patient seen for aquatic therapy today.  Treatment  took place in water 3.5-4.5 feet deep depending upon activity.  Pt entered and exited the pool with bil rails, step to pattern.   HHA for gait from stairs over to bench in water.   Seated on bench In long sitting with bil LE's out in extension- Passive heel cord and hamstring stretching for ~60 seconds for 2-3 reps each, bil sides.  Seated at edge of bench: sit to stands with feet in parallel position, then staggered stance position (performed with each foot forward) with assist to keep feet on pool floor for 10 reps each.  Gait with yellow noodle in ~4.8 -5.0 foot water across pool/back for 3-4 laps each. Min guard to min assist needed for balance.  Forward walking with emphasis on tall posture, heel>toe step progression and equal step length. Backward walking with emphasis on posture and step length.  Side stepping left<>right with emphasis on tall posture, step length and weight shifting.   Balance: With yellow noodle- in ~4.8 to 5.0 foot water across pool/back for 3-4 laps each. Min guard to min assist for balance.  Walking marching bring knees up to noodle with cues for posture and technique.  Walking with upper trunk rotation left<>right moving noodle at water surface   With single bar bells: in same depth of  water for 2-3 laps across pool/back Walking with alternating punching at water level with cues on sequencing and posture with arm movements. Then side stepping with simultaneous shoulder horizontal abd/add under water surface   Strengthening:  at pool edge in ~5.0 foot depth of water With use of pool step Forward steps ups with contralateral march for 10 reps each side Lateral step ups bil sides for 10 reps each side    Pt requires buoyancy of water for support for reduced fall risk with gait training and balance exercises with minimal UE support; exercises able to be performed safely in water without the risk of fall compared to those same exercises performed on land;   viscosity of water needed for resistance for strengthening.  Current of water provides perturbations for challenging static & dynamic standing balance.               PT Short Term Goals - 06/25/21 1219       PT SHORT TERM GOAL #1   Title Pt will perform 4 steps with single handrail and step through pattern with supervision in order to demo improved functional mobility. ALL STGS DUE 07/16/21    Baseline with bilat handrails: step to with supervision, min guard with step through    Time 4    Period Weeks    Status Revised    Target Date 07/16/21      PT SHORT TERM GOAL #2   Title Pt will undergo further assessment of gait speed with cane with quad tip with LTG written as appropriate.    Time 4    Period Weeks    Status Achieved      PT SHORT TERM GOAL #3   Title Pt will ambulate at least 34' with SPC with quad tip and supervision over level surfaces in order to demo improved household mobility.    Time 4    Period Weeks    Status New               PT Long Term Goals - 06/25/21 1222       PT LONG TERM GOAL #1   Title Pt and pt's caregiver will be independent with final HEP in order to build upon functional gains made in therapy. ALL LTGS DUE 08/13/21    Baseline verbally reviewed- pt reporting performing balance exercises intermittently    Time 8    Period Weeks    Status On-going    Target Date 08/13/21      PT LONG TERM GOAL #2   Title Pt will improve BERG score to at least a 32/56 in order to demo decr fall risk.    Baseline 23/56; 27/56    Time 8    Period Weeks    Status Revised      PT LONG TERM GOAL #3   Title Gait speed goal to be written with SPC with quad tip.    Time 8    Period Weeks    Status New      PT LONG TERM GOAL #4   Title Pt will improve 5x sit <> stand to 16 seconds or less from mat table with UE support in order to demo improved functional BLE strength for transfers.    Baseline 18.44 seconds on 04/28/21, 22.47 on 05/26/21    Time  8    Period Weeks    Status On-going      PT LONG TERM GOAL #5   Title Pt will improve  gait speed to at least 2.65 ft/sec with RW and B AFOs in order to demo improved gait efficiency and improved community mobility.    Time 8    Period Weeks    Status Revised                   Plan - 07/12/21 1700     Clinical Impression Statement Today's skilled session returned to working on gait, strengthening and balance in the aquatic setting. No issues noted or reported in session. The pt is making steady progress toward goals and should benefit from continued PT to progress toward unmet goals.    Personal Factors and Comorbidities Comorbidity 3+;Past/Current Experience;Fitness   pt very active prior to diagnosis   Comorbidities GBS, a fib, hx of covid.    Examination-Activity Limitations Bathing;Bed Mobility;Caring for [employer];Locomotion Level;Dressing;Hygiene/Grooming;Transfers;Stand;Toileting    Examination-Participation Restrictions Cleaning;Community Activity;Yard Work;Meal Prep;Shop;Laundry;Driving;Occupation   exercise, cycling   Stability/Clinical Decision Making Evolving/Moderate complexity    Rehab Potential Good    PT Frequency 2x / week   1-2x a week   PT Duration 12 weeks    PT Treatment/Interventions Manual techniques;Therapeutic exercise;Gait training;Neuromuscular re-education;Aquatic Therapy;Orthotic Fit/Training    PT Next Visit Plan gait with cane with quad tip, continued to work on strengthing and balance - ankle/hip/stepping strategiesand eyes closed/narrow BOS.    PT Home Exercise Plan [id number]    Consulted and Agree with Plan of Care Patient;Family member/caregiver    Family Member Consulted pt's wife and daughter.             Patient will benefit from skilled therapeutic intervention in order to improve the following deficits and impairments:  Decreased activity tolerance, Decreased coordination, Decreased balance, Decreased endurance, Decreased  range of motion, Decreased strength, Difficulty walking, Impaired sensation, Postural dysfunction  Visit Diagnosis: Muscle weakness (generalized)  Other symptoms and signs involving the nervous system  Abnormal posture  Difficulty in walking, not elsewhere classified     Problem List Patient Active Problem List   Diagnosis Date Noted   Acute on chronic respiratory failure with hypoxia (HCC)    Guillain Barr syndrome (HCC)    Paroxysmal atrial fibrillation (HCC)    Autonomic dysfunction    Severe sepsis (HCC)     Sallyanne Kuster, PTA, [facility] Outpatient Neuro [facility] [address], [address] [place], [place] [address] [phone] 07/13/21, 8:33 PM   Name: Joseph Mayo MRN: [id number] Date of Birth: 08-02-1946

## 2021-07-14 ENCOUNTER — Ambulatory Visit: Payer: No Typology Code available for payment source | Admitting: Physical Therapy

## 2021-07-14 ENCOUNTER — Encounter: Payer: Self-pay | Admitting: Physical Therapy

## 2021-07-14 DIAGNOSIS — R29818 Other symptoms and signs involving the nervous system: Secondary | ICD-10-CM

## 2021-07-14 DIAGNOSIS — R262 Difficulty in walking, not elsewhere classified: Secondary | ICD-10-CM

## 2021-07-14 DIAGNOSIS — M6281 Muscle weakness (generalized): Secondary | ICD-10-CM

## 2021-07-14 DIAGNOSIS — R293 Abnormal posture: Secondary | ICD-10-CM

## 2021-07-14 NOTE — Therapy (Addendum)
[facility] [address] [place], [place], [address] Phone: [phone]   Fax:  [phone]  Physical Therapy Treatment  Patient Details  Name: Joseph Mayo MRN: [id number] Date of Birth: Dec 31, 1945 Referring Provider (PT): Sim Boast, MD   Encounter Date: 07/14/2021   PT End of Session - 07/14/21 1023     Visit Number 47    Number of Visits 60    Date for PT Re-Evaluation 09/16/21    Authorization Type [place]    Authorization Time Period 15 visits through 04/28/21; 15 visits from 03/24/21-07/22/21; 15 visits    Authorization - Visit Number 2    Authorization - Number of Visits 15    PT Start Time 1017    PT Stop Time 1058    PT Time Calculation (min) 41 min    Equipment Utilized During Treatment Other (comment)   pool noods, single bar bell, aquatic step   Activity Tolerance Patient tolerated treatment well    Behavior During Therapy WFL for tasks assessed/performed             Past Medical History:  Diagnosis Date   Acute on chronic respiratory failure with hypoxia (HCC)    Autonomic dysfunction    Guillain Barr syndrome (HCC)    Paroxysmal atrial fibrillation (HCC)    Severe sepsis (HCC)     History reviewed. No pertinent surgical history.  There were no vitals filed for this visit.   Subjective Assessment - 07/14/21 1020     Subjective Had a fall. Was reaching to get something out of the pantry and fell backwards. Needed help to get up. Not wearing his AFOs today.    Patient is accompained by: Family member   spouse, daughter and grandson   Pertinent History GBS, a fib, hx of covid    Limitations Standing;Walking;Sitting    How long can you stand comfortably? about 1-2 minutes at the countertop with daughter assisting.    Patient Stated Goals wants to get up and walk.    Currently in Pain? No/denies                               [facility] Adult PT Treatment/Exercise -  07/14/21 1023       Ambulation/Gait   Ambulation/Gait Yes    Ambulation/Gait Assistance 5: Supervision    Ambulation/Gait Assistance Details During session with rollator and no braces    Assistive device 4-wheeled walker   Rollator   Gait Pattern Step-through pattern;Decreased stride length;Decreased step length - right;Decreased step length - left;Decreased dorsiflexion - right;Decreased dorsiflexion - left;Right steppage;Left steppage;Narrow base of support    Ambulation Surface Level;Indoor      Knee/Hip Exercises: Aerobic   Other Aerobic Scift UE/LE's (seat 20 and arms 9) on level 4.5 x 8  minutes with goal >/= 60 steps per minute for strengthening and activity tolerance                 Balance Exercises - 07/14/21 1057       Balance Exercises: Standing   Standing Eyes Opened Solid surface    Standing Eyes Opened Limitations Feet hip width distance; 3 x 5 reps head turns, 3 x 5 reps head nods, cues for posture, intermittent taps to bars for balance esp when losing it in posterior direction. Min guard/min A for balance.    Standing Eyes Closed Wide (BOA);Solid surface;Limitations    Standing Eyes  Closed Limitations Wide BOS eyes closed  5 reps of 20 seconds with intermittent taps to bars for balance, cues for posture before closing eyes and using hip strategy. Min guard/min A for balance    SLS with Vectors Solid surface;Upper extremity assist 1;Limitations    SLS with Vectors Limitations Alternating foot taps x10 reps B to 4" step, needing UE support, tried a couple reps without with pt needing min guard/reaching out to bars for balance at times.    Wall Bumps 15 reps;Hip;Eyes opened;Limitations    Wall Bumps Limitations Without UE support, cues for proper technique and glute activation    Stepping Strategy Posterior;UE support;10 reps;Limitations    Stepping Strategy Limitations Cues for weight shift    Rockerboard Anterior/posterior;EO;Limitations    Rockerboard  Limitations Weight shifting on smaller profile rockerboard; x15 reps, attempted without UE support but pt needing to reach out to bars. Trying to keep board steady alternating UE reaches x 5 reps each side.                PT Education - 07/14/21 1100     Education Details Using bilat AFOs during gait esp when outdoors due to foot drop for incr safety.    Person(s) Educated Patient;Spouse    Methods Explanation    Comprehension Verbalized understanding              PT Short Term Goals - 06/25/21 1219       PT SHORT TERM GOAL #1   Title Pt will perform 4 steps with single handrail and step through pattern with supervision in order to demo improved functional mobility. ALL STGS DUE 07/16/21    Baseline with bilat handrails: step to with supervision, min guard with step through    Time 4    Period Weeks    Status Revised    Target Date 07/16/21      PT SHORT TERM GOAL #2   Title Pt will undergo further assessment of gait speed with cane with quad tip with LTG written as appropriate.    Time 4    Period Weeks    Status Achieved      PT SHORT TERM GOAL #3   Title Pt will ambulate at least 39' with SPC with quad tip and supervision over level surfaces in order to demo improved household mobility.    Time 4    Period Weeks    Status New               PT Long Term Goals - 06/25/21 1222       PT LONG TERM GOAL #1   Title Pt and pt's caregiver will be independent with final HEP in order to build upon functional gains made in therapy. ALL LTGS DUE 08/13/21    Baseline verbally reviewed- pt reporting performing balance exercises intermittently    Time 8    Period Weeks    Status On-going    Target Date 08/13/21      PT LONG TERM GOAL #2   Title Pt will improve BERG score to at least a 32/56 in order to demo decr fall risk.    Baseline 23/56; 27/56    Time 8    Period Weeks    Status Revised      PT LONG TERM GOAL #3   Title Gait speed goal to be written  with SPC with quad tip.    Time 8    Period Weeks    Status  New      PT LONG TERM GOAL #4   Title Pt will improve 5x sit <> stand to 16 seconds or less from mat table with UE support in order to demo improved functional BLE strength for transfers.    Baseline 18.44 seconds on 04/28/21, 22.47 on 05/26/21    Time 8    Period Weeks    Status On-going      PT LONG TERM GOAL #5   Title Pt will improve gait speed to at least 2.65 ft/sec with RW and B AFOs in order to demo improved gait efficiency and improved community mobility.    Time 8    Period Weeks    Status Revised                   Plan - 07/14/21 1151     Clinical Impression Statement Today's skilled session focused on BLE strengthening/endurance on SciFit and standing balance strategies working on decr UE support. Pt with lack of ankle strategy to help maintain balance with static standing with head motions and eyes closed. Pt needing to reach out to bars for balance, cues to help use hip strategy to help with balance. Will continue to progress towards LTGs.    Personal Factors and Comorbidities Comorbidity 3+;Past/Current Experience;Fitness   pt very active prior to diagnosis   Comorbidities GBS, a fib, hx of covid.    Examination-Activity Limitations Bathing;Bed Mobility;Caring for [employer];Locomotion Level;Dressing;Hygiene/Grooming;Transfers;Stand;Toileting    Examination-Participation Restrictions Cleaning;Community Activity;Yard Work;Meal Prep;Shop;Laundry;Driving;Occupation   exercise, cycling   Stability/Clinical Decision Making Evolving/Moderate complexity    Rehab Potential Good    PT Frequency 2x / week   1-2x a week   PT Duration 12 weeks    PT Treatment/Interventions Manual techniques;Therapeutic exercise;Gait training;Neuromuscular re-education;Aquatic Therapy;Orthotic Fit/Training    PT Next Visit Plan check STGs. gait with cane with quad tip, continued to work on strengthing and balance -  ankle/hip/stepping strategiesand eyes closed/narrow BOS.    PT Home Exercise Plan [id number]    Consulted and Agree with Plan of Care Patient;Family member/caregiver    Family Member Consulted pt's wife and daughter.             Patient will benefit from skilled therapeutic intervention in order to improve the following deficits and impairments:  Decreased activity tolerance, Decreased coordination, Decreased balance, Decreased endurance, Decreased range of motion, Decreased strength, Difficulty walking, Impaired sensation, Postural dysfunction  Visit Diagnosis: Muscle weakness (generalized)  Other symptoms and signs involving the nervous system  Abnormal posture  Difficulty in walking, not elsewhere classified     Problem List Patient Active Problem List   Diagnosis Date Noted   Acute on chronic respiratory failure with hypoxia (HCC)    Guillain Barr syndrome (HCC)    Paroxysmal atrial fibrillation (HCC)    Autonomic dysfunction    Severe sepsis (HCC)     Drake Leach, PT, DPT  07/14/2021, 11:53 AM  [facility] [address] [place], [place], [address] Phone: [phone]   Fax:  [phone]  Name: MCKOY BHAKTA MRN: [id number] Date of Birth: Apr 24, 1946

## 2021-07-19 ENCOUNTER — Other Ambulatory Visit: Payer: Self-pay

## 2021-07-19 ENCOUNTER — Ambulatory Visit: Payer: No Typology Code available for payment source | Admitting: Physical Therapy

## 2021-07-19 ENCOUNTER — Encounter: Payer: Self-pay | Admitting: Physical Therapy

## 2021-07-19 DIAGNOSIS — R262 Difficulty in walking, not elsewhere classified: Secondary | ICD-10-CM

## 2021-07-19 DIAGNOSIS — M6281 Muscle weakness (generalized): Secondary | ICD-10-CM

## 2021-07-19 DIAGNOSIS — R208 Other disturbances of skin sensation: Secondary | ICD-10-CM

## 2021-07-19 DIAGNOSIS — R293 Abnormal posture: Secondary | ICD-10-CM

## 2021-07-19 NOTE — Therapy (Signed)
[facility] [address] [place], [place], [address] Phone: [phone]   Fax:  [phone]  Physical Therapy Treatment  Patient Details  Name: Joseph Mayo MRN: [id number] Date of Birth: 1946/05/08 Referring Provider (PT): Sim Boast, MD   Encounter Date: 07/19/2021   PT End of Session - 07/19/21 1805     Visit Number 48    Number of Visits 60    Date for PT Re-Evaluation 09/16/21    Authorization Type [place]    Authorization Time Period 15 visits through 04/28/21; 15 visits from 03/24/21-07/22/21; 15 visits    Authorization - Visit Number 3    Authorization - Number of Visits 15    PT Start Time 1233    PT Stop Time 1315    PT Time Calculation (min) 42 min    Equipment Utilized During Treatment Other (comment)   pool noodles, , aquatic step, aquatic weigths   Activity Tolerance Patient tolerated treatment well    Behavior During Therapy WFL for tasks assessed/performed             Past Medical History:  Diagnosis Date   Acute on chronic respiratory failure with hypoxia (HCC)    Autonomic dysfunction    Guillain Barr syndrome (HCC)    Paroxysmal atrial fibrillation (HCC)    Severe sepsis (HCC)     History reviewed. No pertinent surgical history.  There were no vitals filed for this visit.   Subjective Assessment - 07/19/21 1804     Subjective No new complaitns. No new falls. Does report some bil toe pain with movements/touch, not sure why.    Patient is accompained by: Family member   spouse   Pertinent History GBS, a fib, hx of covid    Limitations Standing;Walking;Sitting    How long can you stand comfortably? about 1-2 minutes at the countertop with daughter assisting.    Patient Stated Goals wants to get up and walk.    Currently in Pain? No/denies              Aquatic therapy at [facility] - pool temp 94 degrees   Patient seen for aquatic therapy today.  Treatment took place in  water 3.5-4.5 feet deep depending upon activity.  Pt entered and  Exited the pool via step negotiation with use of bil. Hands rails, step by step with descension and step over step with ascension for exiting the pool.  Passive stretching of bil LE''s in long sitting at bench- heel cords, hamstrings  Sit to stands in parallel, then staggered stance for 10 reps each. Assist needed to keep feet on floor of pool  Gait in ~4.0 to 4.5 foot depth water With yellow noodle Forward  gait x 3 laps across/back Backward gait x 3 laps across/back  Strengthening: 4.0 to 4.3 foot water With 2.5# ankle weights-  With pt against wall- single leg bicycle motions for 10 reps each side,   With pool step Forward step ups with contralateral hip extension, then lateral step ups with contralateral hip side kick 10 reps each  Balance ~4.3 foot water Working on [employer] ex's in static standing with min guard to min assist for balance     Pt requires buoyancy of water for support for reduced fall risk with gait training and balance exercises with minimal UE support; exercises able to be performed safely in water without the risk of fall compared to those same exercises performed on land;  viscosity of water  needed for resistance for strengthening.  Current of water provides perturbations for challenging static & dynamic standing balance.                PT Short Term Goals - 06/25/21 1219       PT SHORT TERM GOAL #1   Title Pt will perform 4 steps with single handrail and step through pattern with supervision in order to demo improved functional mobility. ALL STGS DUE 07/16/21    Baseline with bilat handrails: step to with supervision, min guard with step through    Time 4    Period Weeks    Status Revised    Target Date 07/16/21      PT SHORT TERM GOAL #2   Title Pt will undergo further assessment of gait speed with cane with quad tip with LTG written as appropriate.    Time 4    Period Weeks     Status Achieved      PT SHORT TERM GOAL #3   Title Pt will ambulate at least 2' with SPC with quad tip and supervision over level surfaces in order to demo improved household mobility.    Time 4    Period Weeks    Status New               PT Long Term Goals - 06/25/21 1222       PT LONG TERM GOAL #1   Title Pt and pt's caregiver will be independent with final HEP in order to build upon functional gains made in therapy. ALL LTGS DUE 08/13/21    Baseline verbally reviewed- pt reporting performing balance exercises intermittently    Time 8    Period Weeks    Status On-going    Target Date 08/13/21      PT LONG TERM GOAL #2   Title Pt will improve BERG score to at least a 32/56 in order to demo decr fall risk.    Baseline 23/56; 27/56    Time 8    Period Weeks    Status Revised      PT LONG TERM GOAL #3   Title Gait speed goal to be written with SPC with quad tip.    Time 8    Period Weeks    Status New      PT LONG TERM GOAL #4   Title Pt will improve 5x sit <> stand to 16 seconds or less from mat table with UE support in order to demo improved functional BLE strength for transfers.    Baseline 18.44 seconds on 04/28/21, 22.47 on 05/26/21    Time 8    Period Weeks    Status On-going      PT LONG TERM GOAL #5   Title Pt will improve gait speed to at least 2.65 ft/sec with RW and B AFOs in order to demo improved gait efficiency and improved community mobility.    Time 8    Period Weeks    Status Revised                   Plan - 07/19/21 1806     Personal Factors and Comorbidities Comorbidity 3+;Past/Current Experience;Fitness   pt very active prior to diagnosis   Comorbidities GBS, a fib, hx of covid.    Examination-Activity Limitations Bathing;Bed Mobility;Caring for [employer];Locomotion Level;Dressing;Hygiene/Grooming;Transfers;Stand;Toileting    Examination-Participation Restrictions Cleaning;Community Activity;Yard Work;Meal  Prep;Shop;Laundry;Driving;Occupation   exercise, cycling   Stability/Clinical Decision Making Evolving/Moderate complexity  Rehab Potential Good    PT Frequency 2x / week   1-2x a week   PT Duration 12 weeks    PT Treatment/Interventions Manual techniques;Therapeutic exercise;Gait training;Neuromuscular re-education;Aquatic Therapy;Orthotic Fit/Training    PT Next Visit Plan check remaining STGs. gait with cane with quad tip, continued to work on strengthing and balance - ankle/hip/stepping strategiesand eyes closed/narrow BOS.    PT Home Exercise Plan [id number]    Consulted and Agree with Plan of Care Patient;Family member/caregiver    Family Member Consulted pt's wife and daughter.             Patient will benefit from skilled therapeutic intervention in order to improve the following deficits and impairments:  Decreased activity tolerance, Decreased coordination, Decreased balance, Decreased endurance, Decreased range of motion, Decreased strength, Difficulty walking, Impaired sensation, Postural dysfunction  Visit Diagnosis: Muscle weakness (generalized)  Abnormal posture  Difficulty in walking, not elsewhere classified  Other disturbances of skin sensation     Problem List Patient Active Problem List   Diagnosis Date Noted   Acute on chronic respiratory failure with hypoxia (HCC)    Guillain Barr syndrome (HCC)    Paroxysmal atrial fibrillation (HCC)    Autonomic dysfunction    Severe sepsis (HCC)     Sallyanne Kuster, PTA, [facility] [address], [address] [place], [place] [address] [phone] 07/19/21, 6:09 PM   Name: SHANTI EICHEL MRN: [id number] Date of Birth: May 16, 1946

## 2021-07-21 ENCOUNTER — Ambulatory Visit: Payer: No Typology Code available for payment source | Admitting: Physical Therapy

## 2021-07-21 ENCOUNTER — Encounter: Payer: Self-pay | Admitting: Physical Therapy

## 2021-07-21 ENCOUNTER — Other Ambulatory Visit: Payer: Self-pay

## 2021-07-21 DIAGNOSIS — M6281 Muscle weakness (generalized): Secondary | ICD-10-CM

## 2021-07-21 DIAGNOSIS — R262 Difficulty in walking, not elsewhere classified: Secondary | ICD-10-CM

## 2021-07-21 DIAGNOSIS — R293 Abnormal posture: Secondary | ICD-10-CM

## 2021-07-21 NOTE — Therapy (Signed)
[facility] [address] [place], [place], [address] Phone: [phone]   Fax:  [phone]  Physical Therapy Treatment  Patient Details  Name: Joseph Mayo MRN: [id number] Date of Birth: Apr 02, 1946 Referring Provider (PT): Sim Boast, MD   Encounter Date: 07/21/2021   PT End of Session - 07/21/21 1110     Visit Number 49    Number of Visits 60    Date for PT Re-Evaluation 09/16/21    Authorization Type [place]    Authorization Time Period 15 visits through 04/28/21; 15 visits from 03/24/21-07/22/21; 15 visits    Authorization - Visit Number 4    Authorization - Number of Visits 15    PT Start Time 1106   pt late to session   Equipment Utilized During Treatment Gait belt    Activity Tolerance Patient tolerated treatment well    Behavior During Therapy [facility] for tasks assessed/performed             Past Medical History:  Diagnosis Date   Acute on chronic respiratory failure with hypoxia (HCC)    Autonomic dysfunction    Guillain Barr syndrome (HCC)    Paroxysmal atrial fibrillation (HCC)    Severe sepsis (HCC)     History reviewed. No pertinent surgical history.  There were no vitals filed for this visit.   Subjective Assessment - 07/21/21 1108     Subjective Had a fall - was going around his car to get his walker and tripped over a curb. Skinned your knee. A couple of guys were there and they helped him up.    Patient is accompained by: Family member   spouse   Pertinent History GBS, a fib, hx of covid    Limitations Standing;Walking;Sitting    How long can you stand comfortably? about 1-2 minutes at the countertop with daughter assisting.    Patient Stated Goals wants to get up and walk.    Currently in Pain? No/denies                               [facility] Adult PT Treatment/Exercise - 07/21/21 1154       Transfers   Comments x5 reps sit <> stands - standing with UE support  and sitting with none focusing on eccentric control.                 Balance Exercises - 07/21/21 1130       Balance Exercises: Standing   Standing Eyes Closed Wide (BOA);Solid surface;Limitations    Standing Eyes Closed Limitations Wide BOS eyes closed  5 reps of 20 seconds with intermittent taps to bars for balance, cues for posture before closing eyes and using hip strategy. Min guard/min A for balance    SLS with Vectors Solid surface;Upper extremity assist 1;Limitations    SLS with Vectors Limitations Alternating foot taps x12 reps bilat to 6" step, needing UE support, tried a couple reps without with pt needing min guard/reaching out to bars for balance at times.    Standing, One Foot on a Step Eyes open;6 inch;Limitations    Standing, One Foot on a Step Limitations alternating between legs as stance legs, cues for glute activation, intermittently letting go of bars for balance. incr difficulty with LLE, performed x4 reps each side, for about 10-15 seconds each.    Step Ups Forward;UE support 2;6 inch;Limitations    Step Ups Limitations x10 reps  each side with single UE support, floating non stance leg, wit cues for posture.    Sidestepping 4 reps    Sidestepping Limitations On level ground in // bars - working on weight shifting and incr foot clearance, down and back x4 reps, trying without UE support, pt mostly losing his balance forwards.    Marching Solid surface;Forwards;Limitations    Marching Limitations Forwards and retro marching with single UE support down and back x4 reps with cues for incr hip/knee flexion    Other Standing Exercises On air ex with wide BOS: alternating UE lifts over head x5 reps each side,x15 reps ball toss with PT tech with intermittent reaching to bars for balance and min guard/min A from PT    Other Standing Exercises Comments Forwards/retro walking down and back x4 reps, trying without UE support                  PT Short Term Goals -  07/21/21 1204       PT SHORT TERM GOAL #1   Title Pt will perform 4 steps with single handrail and step through pattern with supervision in order to demo improved functional mobility. ALL STGS DUE 07/16/21    Baseline with bilat handrails: step to with supervision, min guard with step through    Time 4    Period Weeks    Status Deferred    Target Date 07/16/21      PT SHORT TERM GOAL #2   Title Pt will undergo further assessment of gait speed with cane with quad tip with LTG written as appropriate.    Time 4    Period Weeks    Status Deferred      PT SHORT TERM GOAL #3   Title Pt will ambulate at least 8' with SPC with quad tip and supervision over level surfaces in order to demo improved household mobility.    Time 4    Period Weeks    Status Deferred               PT Long Term Goals - 06/25/21 1222       PT LONG TERM GOAL #1   Title Pt and pt's caregiver will be independent with final HEP in order to build upon functional gains made in therapy. ALL LTGS DUE 08/13/21    Baseline verbally reviewed- pt reporting performing balance exercises intermittently    Time 8    Period Weeks    Status On-going    Target Date 08/13/21      PT LONG TERM GOAL #2   Title Pt will improve BERG score to at least a 32/56 in order to demo decr fall risk.    Baseline 23/56; 27/56    Time 8    Period Weeks    Status Revised      PT LONG TERM GOAL #3   Title Gait speed goal to be written with SPC with quad tip.    Time 8    Period Weeks    Status New      PT LONG TERM GOAL #4   Title Pt will improve 5x sit <> stand to 16 seconds or less from mat table with UE support in order to demo improved functional BLE strength for transfers.    Baseline 18.44 seconds on 04/28/21, 22.47 on 05/26/21    Time 8    Period Weeks    Status On-going      PT LONG TERM GOAL #5  Title Pt will improve gait speed to at least 2.65 ft/sec with RW and B AFOs in order to demo improved gait efficiency and  improved community mobility.    Time 8    Period Weeks    Status Revised                   Plan - 07/21/21 1203     Clinical Impression Statement Pt ambulated into session with rollator and without his bilat AFOs and with a more steppage gait pattern. Discussed wearing AFOs to next session to help with balance/ankle stability. will defer STGs to next session when pt wears his AFOs, esp with ambulating with cane. Today's skilled session focused on standing balance strategies on compliant surfaces and on level ground with decr UE support. Will continue to progress towards LTGs    Personal Factors and Comorbidities Comorbidity 3+;Past/Current Experience;Fitness   pt very active prior to diagnosis   Comorbidities GBS, a fib, hx of covid.    Examination-Activity Limitations Bathing;Bed Mobility;Caring for [employer];Locomotion Level;Dressing;Hygiene/Grooming;Transfers;Stand;Toileting    Examination-Participation Restrictions Cleaning;Community Activity;Yard Work;Meal Prep;Shop;Laundry;Driving;Occupation   exercise, cycling   Stability/Clinical Decision Making Evolving/Moderate complexity    Rehab Potential Good    PT Frequency 2x / week   1-2x a week   PT Duration 12 weeks    PT Treatment/Interventions Manual techniques;Therapeutic exercise;Gait training;Neuromuscular re-education;Aquatic Therapy;Orthotic Fit/Training    PT Next Visit Plan check STGs (if pt wearing his braces, asked pt to wear his braces next time), continued to work on strengthing and balance - ankle/hip/stepping strategiesand eyes closed/narrow BOS.    PT Home Exercise Plan [id number]    Consulted and Agree with Plan of Care Patient;Family member/caregiver    Family Member Consulted pt's wife and daughter.             Patient will benefit from skilled therapeutic intervention in order to improve the following deficits and impairments:  Decreased activity tolerance, Decreased coordination, Decreased  balance, Decreased endurance, Decreased range of motion, Decreased strength, Difficulty walking, Impaired sensation, Postural dysfunction  Visit Diagnosis: Muscle weakness (generalized)  Abnormal posture  Difficulty in walking, not elsewhere classified     Problem List Patient Active Problem List   Diagnosis Date Noted   Acute on chronic respiratory failure with hypoxia (HCC)    Guillain Barr syndrome (HCC)    Paroxysmal atrial fibrillation (HCC)    Autonomic dysfunction    Severe sepsis (HCC)     Drake Leach, PT, DPT  07/21/2021, 12:07 PM  [facility] [address] [place], [place], [address] Phone: [phone]   Fax:  [phone]  Name: Joseph Mayo MRN: [id number] Date of Birth: March 27, 1946

## 2021-07-26 ENCOUNTER — Encounter: Payer: Self-pay | Admitting: Physical Therapy

## 2021-07-26 ENCOUNTER — Ambulatory Visit: Payer: No Typology Code available for payment source | Attending: Family Medicine | Admitting: Physical Therapy

## 2021-07-26 ENCOUNTER — Other Ambulatory Visit: Payer: Self-pay

## 2021-07-26 DIAGNOSIS — R29818 Other symptoms and signs involving the nervous system: Secondary | ICD-10-CM | POA: Diagnosis present

## 2021-07-26 DIAGNOSIS — R262 Difficulty in walking, not elsewhere classified: Secondary | ICD-10-CM | POA: Insufficient documentation

## 2021-07-26 DIAGNOSIS — R208 Other disturbances of skin sensation: Secondary | ICD-10-CM | POA: Diagnosis present

## 2021-07-26 DIAGNOSIS — M6281 Muscle weakness (generalized): Secondary | ICD-10-CM | POA: Diagnosis present

## 2021-07-26 DIAGNOSIS — R293 Abnormal posture: Secondary | ICD-10-CM | POA: Diagnosis present

## 2021-07-26 NOTE — Therapy (Signed)
[facility] [address] [place], [place], [address] Phone: [phone]   Fax:  [phone]  Physical Therapy Treatment  Patient Details  Name: Joseph Mayo MRN: [id number] Date of Birth: [birth year] Referring Provider (PT): Sim Boast, MD   Encounter Date: 07/26/2021   PT End of Session - 07/26/21 1230     Visit Number 50    Number of Visits 60    Date for PT Re-Evaluation 09/16/21    Authorization Type [place]    Authorization Time Period 15 visits through 04/28/21; 15 visits from 03/24/21-07/22/21; 15 visits    Authorization - Visit Number 5    Authorization - Number of Visits 15    PT Start Time 1230    PT Stop Time 1312    PT Time Calculation (min) 42 min    Equipment Utilized During Treatment Other (comment)   pool noodles   Activity Tolerance Patient tolerated treatment well    Behavior During Therapy [facility] for tasks assessed/performed             Past Medical History:  Diagnosis Date   Acute on chronic respiratory failure with hypoxia (HCC)    Autonomic dysfunction    Guillain Barr syndrome (HCC)    Paroxysmal atrial fibrillation (HCC)    Severe sepsis (HCC)     History reviewed. No pertinent surgical history.  There were no vitals filed for this visit.   Subjective Assessment - 07/26/21 1230     Subjective No new falls or complaints today. "no pain out of the ordinary."    Patient is accompained by: Family member   spouse   Pertinent History GBS, a fib, hx of covid    Limitations Standing;Walking;Sitting    How long can you stand comfortably? about 1-2 minutes at the countertop with daughter assisting.    Patient Stated Goals wants to get up and walk.    Currently in Pain? No/denies             Aquatic therapy at [facility] - pool temp 94 degrees.  Pt seen for aquatic therapy today. Treatment took place in water 3.5-4.8 ft deep depending on activity. Pt entered and exited pool via  step negotiation with use of B handrails, step by step with descending and ascending stairs.  At bench in corner of pool:  Passive heel cord/gasatroc/hamstring stretch x 60 sec each side Sit<>Stands with no UE support and assistance in maintaining LE placement. 10 x each with feet parallel, and staggered with each LE forward.   ~4.5 ft depth with yellow pool noodle for stability:  Forward walking with emphasis on proper heel strike x 4  laps down and back Backwards walking x 4  laps down and back Side stepping x 4 laps each direction Forward marching x 4 laps down and back Tandem walking x 3 laps down and back  ~4.5 ft with Standing with LE's wide and moving UE's to challenge static balance: wall behind pt for safety with PTA positioned in front of patient  Horizontal abd/add at water level x 10 each side Shoulder abd/add x 10 each side Cross country ski x 10 each side Trunk rotation while holding blue pool noodle x 10 each side   Pt requires buoyancy of water for support for reduced fall risk with gait training and balance exercises with minimal UE support; exercises able to be performed safely in water without the risk of fall compared to those same exercises on land; viscosity of  water needed for resistance for strengthening. Current of water provides perturbations for challenging static/dynamic standing balance.     PT Short Term Goals - 07/21/21 1204       PT SHORT TERM GOAL #1   Title Pt will perform 4 steps with single handrail and step through pattern with supervision in order to demo improved functional mobility. ALL STGS DUE 07/16/21    Baseline with bilat handrails: step to with supervision, min guard with step through    Time 4    Period Weeks    Status Deferred    Target Date 07/16/21      PT SHORT TERM GOAL #2   Title Pt will undergo further assessment of gait speed with cane with quad tip with LTG written as appropriate.    Time 4    Period Weeks    Status  Deferred      PT SHORT TERM GOAL #3   Title Pt will ambulate at least 53' with SPC with quad tip and supervision over level surfaces in order to demo improved household mobility.    Time 4    Period Weeks    Status Deferred               PT Long Term Goals - 06/25/21 1222       PT LONG TERM GOAL #1   Title Pt and pt's caregiver will be independent with final HEP in order to build upon functional gains made in therapy. ALL LTGS DUE 08/13/21    Baseline verbally reviewed- pt reporting performing balance exercises intermittently    Time 8    Period Weeks    Status On-going    Target Date 08/13/21      PT LONG TERM GOAL #2   Title Pt will improve BERG score to at least a 32/56 in order to demo decr fall risk.    Baseline 23/56; 27/56    Time 8    Period Weeks    Status Revised      PT LONG TERM GOAL #3   Title Gait speed goal to be written with SPC with quad tip.    Time 8    Period Weeks    Status New      PT LONG TERM GOAL #4   Title Pt will improve 5x sit <> stand to 16 seconds or less from mat table with UE support in order to demo improved functional BLE strength for transfers.    Baseline 18.44 seconds on 04/28/21, 22.47 on 05/26/21    Time 8    Period Weeks    Status On-going      PT LONG TERM GOAL #5   Title Pt will improve gait speed to at least 2.65 ft/sec with RW and B AFOs in order to demo improved gait efficiency and improved community mobility.    Time 8    Period Weeks    Status Revised                   Plan - 07/26/21 1313     Clinical Impression Statement Today's aquatics session was focused on further functional strength/gait training and balance training. The pt performed all interventions with no issues noted. The pt could continue to benefit from further skilled PT to address functional deficits.    Personal Factors and Comorbidities Comorbidity 3+;Past/Current Experience;Fitness   pt very active prior to diagnosis   Comorbidities  GBS, a fib, hx of covid.  Examination-Activity Limitations Bathing;Bed Mobility;Caring for [employer];Locomotion Level;Dressing;Hygiene/Grooming;Transfers;Stand;Toileting    Examination-Participation Restrictions Cleaning;Community Activity;Yard Work;Meal Prep;Shop;Laundry;Driving;Occupation   exercise, cycling   Stability/Clinical Decision Making Evolving/Moderate complexity    Rehab Potential Good    PT Frequency 2x / week   1-2x a week   PT Duration 12 weeks    PT Treatment/Interventions Manual techniques;Therapeutic exercise;Gait training;Neuromuscular re-education;Aquatic Therapy;Orthotic Fit/Training    PT Next Visit Plan check STGs (if pt wearing his braces, asked pt to wear his braces next time), continued to work on LE strengthing and balance - ankle/hip/stepping strategies and eyes closed/narrow BOS. Dynamic/static balance.    PT Home Exercise Plan [id number]    Consulted and Agree with Plan of Care Patient;Family member/caregiver    Family Member Consulted pt's wife             Patient will benefit from skilled therapeutic intervention in order to improve the following deficits and impairments:  Decreased activity tolerance, Decreased coordination, Decreased balance, Decreased endurance, Decreased range of motion, Decreased strength, Difficulty walking, Impaired sensation, Postural dysfunction  Visit Diagnosis: Muscle weakness (generalized)  Abnormal posture  Difficulty in walking, not elsewhere classified  Other disturbances of skin sensation     Problem List Patient Active Problem List   Diagnosis Date Noted   Acute on chronic respiratory failure with hypoxia (HCC)    Guillain Barr syndrome (HCC)    Paroxysmal atrial fibrillation (HCC)    Autonomic dysfunction    Severe sepsis (HCC)     Ruben Gottron, SPTA 07/26/2021, 1:19 PM  [facility] [address] [place], [place],  [address] Phone: [phone]   Fax:  [phone]  Name: Joseph Mayo MRN: [id number] Date of Birth: [birth year]

## 2021-07-28 ENCOUNTER — Other Ambulatory Visit: Payer: Self-pay

## 2021-07-28 ENCOUNTER — Encounter: Payer: Self-pay | Admitting: Physical Therapy

## 2021-07-28 ENCOUNTER — Ambulatory Visit: Payer: No Typology Code available for payment source | Admitting: Physical Therapy

## 2021-07-28 DIAGNOSIS — R208 Other disturbances of skin sensation: Secondary | ICD-10-CM

## 2021-07-28 DIAGNOSIS — M6281 Muscle weakness (generalized): Secondary | ICD-10-CM | POA: Diagnosis not present

## 2021-07-28 DIAGNOSIS — R293 Abnormal posture: Secondary | ICD-10-CM

## 2021-07-28 DIAGNOSIS — R262 Difficulty in walking, not elsewhere classified: Secondary | ICD-10-CM

## 2021-07-28 NOTE — Therapy (Signed)
[facility] [address] [place], [place], [address] Phone: [phone]   Fax:  [phone]  Physical Therapy Treatment  Patient Details  Name: Joseph Mayo MRN: [id number] Date of Birth: 1946/03/12 Referring Provider (PT): Caprice Beaver, MD   Encounter Date: 07/28/2021   PT End of Session - 07/28/21 1219     Visit Number 51    Number of Visits 60    Date for PT Re-Evaluation 09/16/21    Authorization Type [facility]    Authorization Time Period 15 visits through 04/28/21; 15 visits from 03/24/21-07/22/21; 15 visits    Authorization - Visit Number 6    Authorization - Number of Visits 15    PT Start Time 1017    PT Stop Time 1056    PT Time Calculation (min) 39 min    Equipment Utilized During Treatment Other (comment);Gait belt   B AFOs   Activity Tolerance Patient tolerated treatment well    Behavior During Therapy WFL for tasks assessed/performed             Past Medical History:  Diagnosis Date   Acute on chronic respiratory failure with hypoxia (HCC)    Autonomic dysfunction    Guillain Barr syndrome ([place])    Paroxysmal atrial fibrillation ([place])    Severe sepsis ([place])     History reviewed. No pertinent surgical history.  There were no vitals filed for this visit.   Subjective Assessment - 07/28/21 1023     Subjective No new falls, complaints, or pain. "These braces bother me sometimes, but they're not too bad today."    Patient is accompained by: Family member   spouse   Pertinent History GBS, a fib, hx of covid    Limitations Standing;Walking;Sitting    How long can you stand comfortably? about 1-2 minutes at the countertop with daughter assisting.    Patient Stated Goals wants to get up and walk.    Currently in Pain? No/denies                [facility] Adult PT Treatment/Exercise - 07/28/21 1037       Bed Mobility   Bed Mobility Supine to Sit;Sit to Supine    Supine to Sit  Supervision/Verbal cueing    Sit to Supine Supervision/Verbal cueing      Transfers   Transfers Sit to Stand;Stand to Sit    Sit to Stand 5: Supervision;With upper extremity assist    Stand to Sit 5: Supervision;With upper extremity assist;To bed;To chair/3-in-1      Ambulation/Gait   Ambulation/Gait Yes    Ambulation/Gait Assistance 4: Min guard    Ambulation/Gait Assistance Details Pt performed large loop in gym and gait thoughout session with straight cane/quad tip and B AFOs. The pt required min guard and demonstrated intermittent mild LOB.    Ambulation Distance (Feet) 300 Feet   big loop in gym   Assistive device 4-wheeled walker;Straight cane   straight cane/quad tip   Gait Pattern Step-through pattern;Decreased stride length;Decreased step length - right;Decreased step length - left;Decreased dorsiflexion - right;Decreased dorsiflexion - left;Right steppage;Left steppage;Narrow base of support    Ambulation Surface Level;Indoor    Gait velocity 14.50 sec = 2.26 ft/sec with straight cane/quad tip and B AFOs    Stairs Yes    Stairs Assistance 4: Min guard;4: Min assist    Stairs Assistance Details (indicate cue type and reason) Pt ascended/descended 4 steps with heavy B UE assist, alternating stepping pattern, and  min guard. The pt then ascended steps with heavy single UE assist, step-to and alternating pattern, and min guard assist. The pt demonstrated significantly decreased trunk extension when ascending with single UE support. The pt descended steps with continued heavy use of B UE's.    Stair Management Technique Two rails;Alternating pattern;Step to pattern;Forwards;One rail Right    Number of Stairs 8    Height of Stairs 6      High Level Balance   High Level Balance Activities Marching forwards;Backward walking;Side stepping    High Level Balance Comments Pt performed  forward marching then backwards wallking x 3 at counter. The ot then performed sidestepping x 4 each  direction at counter. The pt required cues for keeping B feet pointed forward when stepping to the R. The pt required min guard/assist and B UE support to perform both.      Knee/Hip Exercises: Seated   Clamshell with TheraBand Red   x 10 with cues for slow and controlled movements.     Knee/Hip Exercises: Supine   Bridges Strengthening;Both;1 set;10 reps    Bridges Limitations x 10 with 5 sec holds with red theraball under LE's    Straight Leg Raises Strengthening;Both;1 set;10 reps;Limitations    Straight Leg Raises Limitations 10 x each wide with 2# ankle weights on B UE's               PT Short Term Goals - 07/28/21 1214       PT SHORT TERM GOAL #1   Title Pt will perform 4 steps with single handrail and step through pattern with supervision in order to demo improved functional mobility. ALL STGS DUE 07/16/21    Baseline 07/28/2021 with B handrails step through with min guard. Min guard/assist with step-to/step through with single hanrail.    Status Partially Met      PT SHORT TERM GOAL #2   Title Pt will undergo further assessment of gait speed with cane with quad tip with LTG written as appropriate.    Baseline 07/28/2021 2.26 ft/sec with straight cane/quad tip and B AFOs    Status Achieved      PT SHORT TERM GOAL #3   Title Pt will ambulate at least 230' with SPC with quad tip and supervision over level surfaces in order to demo improved household mobility.    Baseline 07/28/2021 Pt ambulated ~250' with SPC/quad tip, B AFOs, and min guard.    Status Partially Met               PT Long Term Goals - 06/25/21 1222       PT LONG TERM GOAL #1   Title Pt and pt's caregiver will be independent with final HEP in order to build upon functional gains made in therapy. ALL LTGS DUE 08/13/21    Baseline verbally reviewed- pt reporting performing balance exercises intermittently    Time 8    Period Weeks    Status On-going    Target Date 08/13/21      PT LONG TERM GOAL  #2   Title Pt will improve BERG score to at least a 32/56 in order to demo decr fall risk.    Baseline 23/56; 27/56    Time 8    Period Weeks    Status Revised      PT LONG TERM GOAL #3   Title Gait speed goal to be written with SPC with quad tip.    Time 8    Period  Weeks    Status New      PT LONG TERM GOAL #4   Title Pt will improve 5x sit <> stand to 16 seconds or less from mat table with UE support in order to demo improved functional BLE strength for transfers.    Baseline 18.44 seconds on 04/28/21, 22.47 on 05/26/21    Time 8    Period Weeks    Status On-going      PT LONG TERM GOAL #5   Title Pt will improve gait speed to at least 2.65 ft/sec with RW and B AFOs in order to demo improved gait efficiency and improved community mobility.    Time 8    Period Weeks    Status Revised               Plan - 07/28/21 1222     Clinical Impression Statement Today's skilled session was focused on assessing STGs with B AFOs. The pt has met 1 STG and partially met remaining 2 STGs. The remainder of session was focused on challenging dynamic balance and LE strengthening exercises. The pt performed all interventions with no noted issues and rest breaks as needed. The pt should continue to benefit from further skilled PT to address remaining functional deficits.    Personal Factors and Comorbidities Comorbidity 3+;Past/Current Experience;Fitness    Comorbidities GBS, a fib, hx of covid.    Examination-Activity Limitations Bathing;Bed Mobility;Caring for [employer];Locomotion Level;Dressing;Hygiene/Grooming;Transfers;Stand;Toileting    Examination-Participation Restrictions Cleaning;Community Activity;Yard Work;Meal Prep;Shop;Laundry;Driving;Occupation    Stability/Clinical Decision Making Evolving/Moderate complexity    Rehab Potential Good    PT Frequency 2x / week    PT Duration 12 weeks    PT Treatment/Interventions Manual techniques;Therapeutic exercise;Gait  training;Neuromuscular re-education;Aquatic Therapy;Orthotic Fit/Training    PT Next Visit Plan continue to work on LE strengthing and balance - ankle/hip/stepping strategies and eyes closed/narrow BOS. Dynamic/static balance. Core stabilization/posture exercises.    PT Home Exercise Plan [id number]    Consulted and Agree with Plan of Care Patient;Family member/caregiver    Family Member Consulted pt's wife             Patient will benefit from skilled therapeutic intervention in order to improve the following deficits and impairments:  Decreased activity tolerance, Decreased coordination, Decreased balance, Decreased endurance, Decreased range of motion, Decreased strength, Difficulty walking, Impaired sensation, Postural dysfunction  Visit Diagnosis: Abnormal posture  Muscle weakness (generalized)  Difficulty in walking, not elsewhere classified  Other disturbances of skin sensation     Problem List Patient Active Problem List   Diagnosis Date Noted   Acute on chronic respiratory failure with hypoxia (HCC)    Guillain Barr syndrome (HCC)    Paroxysmal atrial fibrillation ([place])    Autonomic dysfunction    Severe sepsis ([place])     Rondel Baton, SPTA 07/28/2021, 12:29 PM  [facility] [address] [place], [place], [address] Phone: [phone]   Fax:  [phone]  Name: NASSER KU MRN: [id number] Date of Birth: 01-01-1946

## 2021-08-02 ENCOUNTER — Ambulatory Visit: Payer: No Typology Code available for payment source | Admitting: Physical Therapy

## 2021-08-02 ENCOUNTER — Other Ambulatory Visit: Payer: Self-pay

## 2021-08-02 ENCOUNTER — Encounter: Payer: Self-pay | Admitting: Physical Therapy

## 2021-08-02 DIAGNOSIS — R262 Difficulty in walking, not elsewhere classified: Secondary | ICD-10-CM

## 2021-08-02 DIAGNOSIS — R293 Abnormal posture: Secondary | ICD-10-CM

## 2021-08-02 DIAGNOSIS — M6281 Muscle weakness (generalized): Secondary | ICD-10-CM | POA: Diagnosis not present

## 2021-08-02 DIAGNOSIS — R208 Other disturbances of skin sensation: Secondary | ICD-10-CM

## 2021-08-02 NOTE — Therapy (Signed)
[facility] [address] [place], [place], [address] Phone: [phone]   Fax:  [phone]  Physical Therapy Treatment  Patient Details  Name: Joseph Mayo MRN: [id number] Date of Birth: 03/28/1946 Referring Provider (PT): Kumar, Sandhya, MD   Encounter Date: 08/02/2021   PT End of Session - 08/02/21 1848     Visit Number 52    Number of Visits 60    Date for PT Re-Evaluation 09/16/21    Authorization Type [facility]    Authorization Time Period 15 visits through 04/28/21; 15 visits from 03/24/21-07/22/21; 15 visits    Authorization - Visit Number 7    Authorization - Number of Visits 15    PT Start Time 1231    PT Stop Time 1316    PT Time Calculation (min) 45 min    Equipment Utilized During Treatment Other (comment)   aquatic step, foam noodle, single bar bells   Activity Tolerance Patient tolerated treatment well    Behavior During Therapy WFL for tasks assessed/performed             Past Medical History:  Diagnosis Date   Acute on chronic respiratory failure with hypoxia (HCC)    Autonomic dysfunction    Guillain Barr syndrome (HCC)    Paroxysmal atrial fibrillation (HCC)    Severe sepsis (HCC)     History reviewed. No pertinent surgical history.  There were no vitals filed for this visit.   Subjective Assessment - 08/02/21 1417     Subjective No new complaints. No falls or pain to report.    Patient is accompained by: Family member   spouse   Pertinent History GBS, a fib, hx of covid    Limitations Standing;Walking;Sitting    How long can you stand comfortably? about 1-2 minutes at the countertop with daughter assisting.    Patient Stated Goals wants to get up and walk.    Currently in Pain? No/denies             Aquatic therapy at [facility] - pool temp 94 degrees   Patient seen for aquatic therapy today.  Treatment took place in water 3.5-4.5 feet deep depending upon activity.  Pt  entered and  Exited the pool via step negotiation with use of bil. Hands rails, step to pattern with min guard assist.   At bench in water Heel propped on stool with fwd lean for hamstring stretch   With aquatic step in ~4.5- 4.8  foot water depth Heels hanging off for heel cord stretch Fwd step up with contralateral march Lateral step up with contralateral side kick  With yellow noodle Fwd Back Side Marching tandem   With single bar bell Static-  alternating mini lunge with same side punch  Dynamic Fwd walking with alternating punching at water level Side stepping with UE abd/add keeping bar bell under water    Pt requires buoyancy of water for support for reduced fall risk with gait training and balance exercises with minimal UE support; exercises able to be performed safely in water without the risk of fall compared to those same exercises performed on land;  viscosity of water needed for resistance for strengthening.  Current of water provides perturbations for challenging static & dynamic standing balance.                                PT Short Term Goals - 07/28/21 1214         PT SHORT TERM GOAL #1   Title Pt will perform 4 steps with single handrail and step through pattern with supervision in order to demo improved functional mobility. ALL STGS DUE 07/16/21    Baseline 07/28/2021 with B handrails step through with min guard. Min guard/assist with step-to/step through with single hanrail.    Status Partially Met      PT SHORT TERM GOAL #2   Title Pt will undergo further assessment of gait speed with cane with quad tip with LTG written as appropriate.    Baseline 07/28/2021 2.26 ft/sec with straight cane/quad tip and B AFOs    Status Achieved      PT SHORT TERM GOAL #3   Title Pt will ambulate at least 230' with SPC with quad tip and supervision over level surfaces in order to demo improved household mobility.    Baseline 07/28/2021 Pt  ambulated ~250' with SPC/quad tip, B AFOs, and min guard.    Status Partially Met               PT Long Term Goals - 06/25/21 1222       PT LONG TERM GOAL #1   Title Pt and pt's caregiver will be independent with final HEP in order to build upon functional gains made in therapy. ALL LTGS DUE 08/13/21    Baseline verbally reviewed- pt reporting performing balance exercises intermittently    Time 8    Period Weeks    Status On-going    Target Date 08/13/21      PT LONG TERM GOAL #2   Title Pt will improve BERG score to at least a 32/56 in order to demo decr fall risk.    Baseline 23/56; 27/56    Time 8    Period Weeks    Status Revised      PT LONG TERM GOAL #3   Title Gait speed goal to be written with SPC with quad tip.    Time 8    Period Weeks    Status New      PT LONG TERM GOAL #4   Title Pt will improve 5x sit <> stand to 16 seconds or less from mat table with UE support in order to demo improved functional BLE strength for transfers.    Baseline 18.44 seconds on 04/28/21, 22.47 on 05/26/21    Time 8    Period Weeks    Status On-going      PT LONG TERM GOAL #5   Title Pt will improve gait speed to at least 2.65 ft/sec with RW and B AFOs in order to demo improved gait efficiency and improved community mobility.    Time 8    Period Weeks    Status Revised                   Plan - 08/02/21 1850     Clinical Impression Statement Today' s skilled session continued to focus on stretching, strengthening, gait and balance in the aquatic setting with no issues noted or reported by patient. The pt was able to "let go" of support surface at times with dynamic balance/gait actiivites for short distances while in the deeper water with min guard assist. The pt is making steady progress toward goals and should benefit from continued PT to progress toward unmet goals.    Personal Factors and Comorbidities Comorbidity 3+;Past/Current Experience;Fitness    Comorbidities  GBS, a fib, hx of covid.    Examination-Activity Limitations Bathing;Bed  Mobility;Caring for Others;Reach Overhead;Locomotion Level;Dressing;Hygiene/Grooming;Transfers;Stand;Toileting    Examination-Participation Restrictions Cleaning;Community Activity;Yard Work;Meal Prep;Shop;Laundry;Driving;Occupation    Stability/Clinical Decision Making Evolving/Moderate complexity    Rehab Potential Good    PT Frequency 2x / week    PT Duration 12 weeks    PT Treatment/Interventions Manual techniques;Therapeutic exercise;Gait training;Neuromuscular re-education;Aquatic Therapy;Orthotic Fit/Training    PT Next Visit Plan continue to work on LE strengthing and balance - ankle/hip/stepping strategies and eyes closed/narrow BOS. Dynamic/static balance. Core stabilization/posture exercises.    PT Home Exercise Plan [id number]    Consulted and Agree with Plan of Care Patient;Family member/caregiver    Family Member Consulted pt's wife             Patient will benefit from skilled therapeutic intervention in order to improve the following deficits and impairments:  Decreased activity tolerance, Decreased coordination, Decreased balance, Decreased endurance, Decreased range of motion, Decreased strength, Difficulty walking, Impaired sensation, Postural dysfunction  Visit Diagnosis: Abnormal posture  Muscle weakness (generalized)  Difficulty in walking, not elsewhere classified  Other disturbances of skin sensation     Problem List Patient Active Problem List   Diagnosis Date Noted   Acute on chronic respiratory failure with hypoxia (HCC)    Guillain Barr syndrome (HCC)    Paroxysmal atrial fibrillation (HCC)    Autonomic dysfunction    Severe sepsis (HCC)     Bury, Kathy, PTA 08/02/2021, 6:52 PM  [facility] [address] [place], [place], [address] Phone: [phone]   Fax:  [phone]  Name: Joseph Mayo MRN:  [id number] Date of Birth: 09/22/1945    

## 2021-08-04 ENCOUNTER — Ambulatory Visit: Payer: No Typology Code available for payment source | Admitting: Physical Therapy

## 2021-08-04 ENCOUNTER — Other Ambulatory Visit: Payer: Self-pay

## 2021-08-04 ENCOUNTER — Encounter: Payer: Self-pay | Admitting: Physical Therapy

## 2021-08-04 DIAGNOSIS — M6281 Muscle weakness (generalized): Secondary | ICD-10-CM

## 2021-08-04 DIAGNOSIS — R208 Other disturbances of skin sensation: Secondary | ICD-10-CM

## 2021-08-04 DIAGNOSIS — R262 Difficulty in walking, not elsewhere classified: Secondary | ICD-10-CM

## 2021-08-04 DIAGNOSIS — R293 Abnormal posture: Secondary | ICD-10-CM

## 2021-08-04 NOTE — Therapy (Signed)
[facility] [address] [place], [place], [address] Phone: [phone]   Fax:  [phone]  Physical Therapy Treatment  Patient Details  Name: Joseph Mayo MRN: [id number] Date of Birth: [birth year] Referring Provider (PT): Caprice Beaver, MD   Encounter Date: 08/04/2021   PT End of Session - 08/04/21 1107     Visit Number 47    Number of Visits 60    Date for PT Re-Evaluation 09/16/21    Authorization Type [facility]    Authorization Time Period 15 visits through 04/28/21; 15 visits from 03/24/21-07/22/21; 15 visits    Authorization - Visit Number 8    Authorization - Number of Visits 15    PT Start Time 1105    PT Stop Time 1145    PT Time Calculation (min) 40 min    Equipment Utilized During Treatment Gait belt    Activity Tolerance Patient tolerated treatment well    Behavior During Therapy WFL for tasks assessed/performed             Past Medical History:  Diagnosis Date   Acute on chronic respiratory failure with hypoxia (HCC)    Autonomic dysfunction    Guillain Barr syndrome ([place])    Paroxysmal atrial fibrillation ([place])    Severe sepsis ([place])     History reviewed. No pertinent surgical history.  There were no vitals filed for this visit.   Subjective Assessment - 08/04/21 1107     Subjective No new complaints. No falls or pain to report.    Patient is accompained by: Family member   spouse   Pertinent History GBS, a fib, hx of covid    Limitations Standing;Walking;Sitting    Patient Stated Goals wants to get up and walk.    Currently in Pain? No/denies                    [facility] Adult PT Treatment/Exercise - 08/04/21 1108       Transfers   Transfers Sit to Stand;Stand to Sit    Sit to Stand 5: Supervision;With upper extremity assist    Stand to Sit 5: Supervision;With upper extremity assist;To bed;To chair/3-in-1    Number of Reps 2 sets;Other reps (comment)   5 reps   Comments  elevated mat table      Ambulation/Gait   Ambulation/Gait Yes    Ambulation/Gait Assistance 4: Min guard    Ambulation/Gait Assistance Details cues on posture and cane placement. minor veering noted at times with distractions.    Ambulation Distance (Feet) 210 Feet   x1, plus around clinic with session   Assistive device Rollator;Straight cane   cane with ruber quad tip   Gait Pattern Step-through pattern;Decreased stride length;Decreased step length - right;Decreased step length - left;Decreased dorsiflexion - right;Decreased dorsiflexion - left;Right steppage;Left steppage;Narrow base of support    Ambulation Surface Level;Indoor      High Level Balance   High Level Balance Activities Negotitating around obstacles;Negotiating over obstacles    High Level Balance Comments wiht bil AFO's/cane: forward stepping over 3 bolsters<>figure 8 around hoola hoops on floor for 4 laps with min guard to min assist. cues for sequencing with stepping over bolsters and for step length with figure 8's.                 Balance Exercises - 08/04/21 1124       Balance Exercises: Standing   Rockerboard Anterior/posterior;Head turns;EO;EC;30 seconds;Other reps (comment);Intermittent UE support;Limitations  Rockerboard Limitations on balance board in anterior/posterior direction: holding the board steady  for alternating UE raises, progressing to bil UE raises with min guard assist. then with no UE support for EC 30 sec's x 3 reps, progressing to EO head movements left<>right, then up<>down for ~10 reps each. Min guard to min assist for balance with cues on posture and weight shifitng to assist with balance.                  PT Short Term Goals - 07/28/21 1214       PT SHORT TERM GOAL #1   Title Pt will perform 4 steps with single handrail and step through pattern with supervision in order to demo improved functional mobility. ALL STGS DUE 07/16/21    Baseline 07/28/2021 with B handrails  step through with min guard. Min guard/assist with step-to/step through with single hanrail.    Status Partially Met      PT SHORT TERM GOAL #2   Title Pt will undergo further assessment of gait speed with cane with quad tip with LTG written as appropriate.    Baseline 07/28/2021 2.26 ft/sec with straight cane/quad tip and B AFOs    Status Achieved      PT SHORT TERM GOAL #3   Title Pt will ambulate at least 230' with SPC with quad tip and supervision over level surfaces in order to demo improved household mobility.    Baseline 07/28/2021 Pt ambulated ~250' with SPC/quad tip, B AFOs, and min guard.    Status Partially Met               PT Long Term Goals - 06/25/21 1222       PT LONG TERM GOAL #1   Title Pt and pt's caregiver will be independent with final HEP in order to build upon functional gains made in therapy. ALL LTGS DUE 08/13/21    Baseline verbally reviewed- pt reporting performing balance exercises intermittently    Time 8    Period Weeks    Status On-going    Target Date 08/13/21      PT LONG TERM GOAL #2   Title Pt will improve BERG score to at least a 32/56 in order to demo decr fall risk.    Baseline 23/56; 27/56    Time 8    Period Weeks    Status Revised      PT LONG TERM GOAL #3   Title Gait speed goal to be written with SPC with quad tip.    Time 8    Period Weeks    Status New      PT LONG TERM GOAL #4   Title Pt will improve 5x sit <> stand to 16 seconds or less from mat table with UE support in order to demo improved functional BLE strength for transfers.    Baseline 18.44 seconds on 04/28/21, 22.47 on 05/26/21    Time 8    Period Weeks    Status On-going      PT LONG TERM GOAL #5   Title Pt will improve gait speed to at least 2.65 ft/sec with RW and B AFOs in order to demo improved gait efficiency and improved community mobility.    Time 8    Period Weeks    Status Revised                   Plan - 08/04/21 1108     Clinical  Impression Statement  Today's skilled session continued to focus on gait with AFO's/cane and balance training with no issues noted or reported in session. The pt is making steady progress and should benefit from continued PT to progress toward unmet goals.    Personal Factors and Comorbidities Comorbidity 3+;Past/Current Experience;Fitness    Comorbidities GBS, a fib, hx of covid.    Examination-Activity Limitations Bathing;Bed Mobility;Caring for [employer];Locomotion Level;Dressing;Hygiene/Grooming;Transfers;Stand;Toileting    Examination-Participation Restrictions Cleaning;Community Activity;Yard Work;Meal Prep;Shop;Laundry;Driving;Occupation    Stability/Clinical Decision Making Evolving/Moderate complexity    Rehab Potential Good    PT Frequency 2x / week    PT Duration 12 weeks    PT Treatment/Interventions Manual techniques;Therapeutic exercise;Gait training;Neuromuscular re-education;Aquatic Therapy;Orthotic Fit/Training    PT Next Visit Plan continue to work on LE strengthing and balance - ankle/hip/stepping strategies and eyes closed/narrow BOS. Dynamic/static balance. Core stabilization/posture exercises.    PT Home Exercise Plan [id number]    Consulted and Agree with Plan of Care Patient;Family member/caregiver    Family Member Consulted pt's wife             Patient will benefit from skilled therapeutic intervention in order to improve the following deficits and impairments:  Decreased activity tolerance, Decreased coordination, Decreased balance, Decreased endurance, Decreased range of motion, Decreased strength, Difficulty walking, Impaired sensation, Postural dysfunction  Visit Diagnosis: Abnormal posture  Muscle weakness (generalized)  Difficulty in walking, not elsewhere classified  Other disturbances of skin sensation     Problem List Patient Active Problem List   Diagnosis Date Noted   Acute on chronic respiratory failure with hypoxia ([place])     Guillain Barr syndrome ([place])    Paroxysmal atrial fibrillation ([place])    Autonomic dysfunction    Severe sepsis ([place])     Willow Ora, PTA, [facility] Neuro [facility] [address], [place], [place] [address] [phone] 08/04/21, 4:09 PM   Name: Joseph Mayo MRN: [id number] Date of Birth: [birth year]

## 2021-08-09 ENCOUNTER — Other Ambulatory Visit: Payer: Self-pay

## 2021-08-09 ENCOUNTER — Ambulatory Visit: Payer: No Typology Code available for payment source | Admitting: Physical Therapy

## 2021-08-09 ENCOUNTER — Encounter: Payer: Self-pay | Admitting: Physical Therapy

## 2021-08-09 DIAGNOSIS — R208 Other disturbances of skin sensation: Secondary | ICD-10-CM

## 2021-08-09 DIAGNOSIS — R293 Abnormal posture: Secondary | ICD-10-CM

## 2021-08-09 DIAGNOSIS — M6281 Muscle weakness (generalized): Secondary | ICD-10-CM | POA: Diagnosis not present

## 2021-08-09 DIAGNOSIS — R262 Difficulty in walking, not elsewhere classified: Secondary | ICD-10-CM

## 2021-08-09 NOTE — Therapy (Signed)
[facility] [address] [place], [place], [address] Phone: [phone]   Fax:  [phone]  Physical Therapy Treatment  Patient Details  Name: Joseph Mayo MRN: [id number] Date of Birth: 10-12-1945 Referring Provider (PT): Caprice Beaver, MD   Encounter Date: 08/09/2021   PT End of Session - 08/09/21 2149     Visit Number 5    Number of Visits 60    Date for PT Re-Evaluation 09/16/21    Authorization Type [facility]    Authorization Time Period 15 visits through 04/28/21; 15 visits from 03/24/21-07/22/21; 15 visits    Authorization - Visit Number 9    Authorization - Number of Visits 15    PT Start Time 1230    PT Stop Time 1318    PT Time Calculation (min) 48 min    Equipment Utilized During Treatment Other (comment)   aquatic step, pool noodle, aquatic weights   Activity Tolerance Patient tolerated treatment well    Behavior During Therapy WFL for tasks assessed/performed             Past Medical History:  Diagnosis Date   Acute on chronic respiratory failure with hypoxia (HCC)    Autonomic dysfunction    Guillain Barr syndrome (HCC)    Paroxysmal atrial fibrillation ([place])    Severe sepsis ([place])     History reviewed. No pertinent surgical history.  There were no vitals filed for this visit.   Subjective Assessment - 08/09/21 2148     Subjective No new complaints. No falls or pain to report. Does report having to cut up and spit a tree that fell across his driveway;    Patient is accompained by: Family member   spouse   Pertinent History GBS, a fib, hx of covid    Limitations Standing;Walking;Sitting    How long can you stand comfortably? about 1-2 minutes at the countertop with daughter assisting.    Patient Stated Goals wants to get up and walk.    Currently in Pain? No/denies              Aquatic therapy at [facility] temp 96 degrees   Patient seen for aquatic therapy today.   Treatment took place in water 3.5-4.5 feet deep depending upon activity.  Pt entered/exited pool via stairs with bil rails and step to pattern.  Use of wall for gait from stairs to bench in water.  At Bench: Long sitting for heel cords Sitting at edge of bench with feet on aquatic step Hamstring stretch Sit<>stands in parallel stance, then staggered stance With 2.5# ankle weights   In ~4.5 to 4.8 foot water with yellow noodle Forward gait Backward gait Side stepping High knee marching Forward gait with upper trunk rotation  In ~4.5 foot water with single bar bells Static stance with feet hip width apart for Shoulder horizontal abd/add under water Shoulder abd/add under water level Cross country skiing  Pt requires buoyancy of water for support for reduced fall risk with gait training and balance exercises with minimal UE support; exercises able to be performed safely in water without the risk of fall compared to those same exercises performed on land;  viscosity of water needed for resistance for strengthening.  Current of water provides perturbations for challenging static & dynamic standing balance.                       PT Short Term Goals - 07/28/21 1214  PT SHORT TERM GOAL #1   Title Pt will perform 4 steps with single handrail and step through pattern with supervision in order to demo improved functional mobility. ALL STGS DUE 07/16/21    Baseline 07/28/2021 with B handrails step through with min guard. Min guard/assist with step-to/step through with single hanrail.    Status Partially Met      PT SHORT TERM GOAL #2   Title Pt will undergo further assessment of gait speed with cane with quad tip with LTG written as appropriate.    Baseline 07/28/2021 2.26 ft/sec with straight cane/quad tip and B AFOs    Status Achieved      PT SHORT TERM GOAL #3   Title Pt will ambulate at least 230' with SPC with quad tip and supervision over level surfaces in  order to demo improved household mobility.    Baseline 07/28/2021 Pt ambulated ~250' with SPC/quad tip, B AFOs, and min guard.    Status Partially Met               PT Long Term Goals - 06/25/21 1222       PT LONG TERM GOAL #1   Title Pt and pt's caregiver will be independent with final HEP in order to build upon functional gains made in therapy. ALL LTGS DUE 08/13/21    Baseline verbally reviewed- pt reporting performing balance exercises intermittently    Time 8    Period Weeks    Status On-going    Target Date 08/13/21      PT LONG TERM GOAL #2   Title Pt will improve BERG score to at least a 32/56 in order to demo decr fall risk.    Baseline 23/56; 27/56    Time 8    Period Weeks    Status Revised      PT LONG TERM GOAL #3   Title Gait speed goal to be written with SPC with quad tip.    Time 8    Period Weeks    Status New      PT LONG TERM GOAL #4   Title Pt will improve 5x sit <> stand to 16 seconds or less from mat table with UE support in order to demo improved functional BLE strength for transfers.    Baseline 18.44 seconds on 04/28/21, 22.47 on 05/26/21    Time 8    Period Weeks    Status On-going      PT LONG TERM GOAL #5   Title Pt will improve gait speed to at least 2.65 ft/sec with RW and B AFOs in order to demo improved gait efficiency and improved community mobility.    Time 8    Period Weeks    Status Revised                   Plan - 08/09/21 2149     Clinical Impression Statement Today's skilled session continued to focus on stretching, strengthening, balance and gait in the aquatic setting with no issues noted or reported in session. The pt is making progress toward goals and should benefit from continued PT to progress toward unmet goals.    Personal Factors and Comorbidities Comorbidity 3+;Past/Current Experience;Fitness    Comorbidities GBS, a fib, hx of covid.    Examination-Activity Limitations Bathing;Bed Mobility;Caring for  [employer];Locomotion Level;Dressing;Hygiene/Grooming;Transfers;Stand;Toileting    Examination-Participation Restrictions Cleaning;Community Activity;Yard Work;Meal Prep;Shop;Laundry;Driving;Occupation    Stability/Clinical Decision Making Evolving/Moderate complexity    Rehab Potential Good  PT Frequency 2x / week    PT Duration 12 weeks    PT Treatment/Interventions Manual techniques;Therapeutic exercise;Gait training;Neuromuscular re-education;Aquatic Therapy;Orthotic Fit/Training    PT Next Visit Plan continue to work on LE strengthing and balance - ankle/hip/stepping strategies and eyes closed/narrow BOS. Dynamic/static balance. Core stabilization/posture exercises.    PT Home Exercise Plan [id number]    Consulted and Agree with Plan of Care Patient;Family member/caregiver    Family Member Consulted pt's wife             Patient will benefit from skilled therapeutic intervention in order to improve the following deficits and impairments:  Decreased activity tolerance, Decreased coordination, Decreased balance, Decreased endurance, Decreased range of motion, Decreased strength, Difficulty walking, Impaired sensation, Postural dysfunction  Visit Diagnosis: Abnormal posture  Muscle weakness (generalized)  Difficulty in walking, not elsewhere classified  Other disturbances of skin sensation     Problem List Patient Active Problem List   Diagnosis Date Noted   Acute on chronic respiratory failure with hypoxia ([place])    Guillain Barr syndrome ([place])    Paroxysmal atrial fibrillation ([place])    Autonomic dysfunction    Severe sepsis ([place])    Willow Ora, PTA, [facility] Outpatient Neuro [facility] [address], [place] [employer], [place] [address] [phone] 08/09/21, 9:56 PM   Name: Joseph Mayo MRN: [id number] Date of Birth: [birth year]

## 2021-08-11 ENCOUNTER — Other Ambulatory Visit: Payer: Self-pay

## 2021-08-11 ENCOUNTER — Encounter: Payer: Self-pay | Admitting: Physical Therapy

## 2021-08-11 ENCOUNTER — Ambulatory Visit: Payer: No Typology Code available for payment source | Admitting: Physical Therapy

## 2021-08-11 DIAGNOSIS — M6281 Muscle weakness (generalized): Secondary | ICD-10-CM

## 2021-08-11 DIAGNOSIS — R293 Abnormal posture: Secondary | ICD-10-CM

## 2021-08-11 DIAGNOSIS — R29818 Other symptoms and signs involving the nervous system: Secondary | ICD-10-CM

## 2021-08-11 DIAGNOSIS — R262 Difficulty in walking, not elsewhere classified: Secondary | ICD-10-CM

## 2021-08-11 NOTE — Therapy (Signed)
[facility] [address] [place], [place], [address] Phone: [phone]   Fax:  [phone]  Physical Therapy Treatment  Patient Details  Name: Joseph Mayo MRN: [id number] Date of Birth: 11-11-1945 Referring Provider (PT): Sim Boast, MD   Encounter Date: 08/11/2021   PT End of Session - 08/11/21 1213     Visit Number 55    Number of Visits 60    Date for PT Re-Evaluation 09/16/21    Authorization Type [facility]    Authorization Time Period 15 visits through 04/28/21; 15 visits from 03/24/21-07/22/21; 15 visits    Authorization - Visit Number 10    Authorization - Number of Visits 15    PT Start Time 1103    PT Stop Time 1145    PT Time Calculation (min) 42 min    Equipment Utilized During Treatment Gait belt    Activity Tolerance Patient tolerated treatment well    Behavior During Therapy WFL for tasks assessed/performed             Past Medical History:  Diagnosis Date   Acute on chronic respiratory failure with hypoxia (HCC)    Autonomic dysfunction    Guillain Barr syndrome (HCC)    Paroxysmal atrial fibrillation (HCC)    Severe sepsis (HCC)     History reviewed. No pertinent surgical history.  There were no vitals filed for this visit.   Subjective Assessment - 08/11/21 1105     Subjective No falls. Has been doing more walking with his cane and his bilat AFOs.    Patient is accompained by: Family member   spouse   Pertinent History GBS, a fib, hx of covid    Limitations Standing;Walking;Sitting    How long can you stand comfortably? about 1-2 minutes at the countertop with daughter assisting.    Patient Stated Goals wants to get up and walk.    Currently in Pain? No/denies                               [facility] Adult PT Treatment/Exercise - 08/11/21 1112       Transfers   Transfers Sit to Stand;Stand to Sit    Sit to Stand 4: Min guard    Five time sit to stand  comments  28.25 seconds with BUE support fro mat table. Needs min guard in standing initially for balance.    Stand to Sit 4: Min guard    Comments From elevated mat table performed x10 reps sit <> stands with BUE support and then sitting with no UE support for eccentric control. With BUE support on chair performed x10 reps mini squats with verbal/tactile/demo cues for technique.      Ambulation/Gait   Ambulation/Gait Yes    Ambulation/Gait Assistance 4: Min guard    Ambulation/Gait Assistance Details Between activities in session    Assistive device Straight cane   B AFOs   Gait Pattern Step-through pattern;Decreased stride length;Decreased step length - right;Decreased step length - left;Decreased dorsiflexion - right;Decreased dorsiflexion - left;Right steppage;Left steppage;Narrow base of support    Ambulation Surface Level;Indoor    Gait velocity 13.87 seconds = 2.36 ft/sec   with SPC with quad tip                Balance Exercises - 08/11/21 1140       Balance Exercises: Standing   Standing Eyes Opened Wide (BOA);Foam/compliant surface  Standing Eyes Opened Limitations On air ex: 2 x 5 reps head turns, 2 x 5 reps head nods, cues for posture and glute activation, intermittent UE support for balance    SLS with Vectors Solid surface;Upper extremity assist 1;Limitations    SLS with Vectors Limitations Alternating foot taps x12 reps bilat to 6" step, UE suppot > none with close min guard and intermittent UE support. Then performed x12 reps cross body taps, with fingertip support.    Sidestepping 3 reps;Limitations    Sidestepping Limitations On blue mat, cues for foot clearance, intermittent UE support, cues for posture.    Other Standing Exercises Forwards walking with head turns, head nods x3 reps each with fingertip support    Other Standing Exercises Comments With air ex: step up/up and down/down alternating legs x10 reps each side, intermittent UE support, min guard for  balance.                PT Education - 08/11/21 1210     Education Details Adding additional visits through January (has only 4 more remaining for [facility] visits and then would like to D/C)    Person(s) Educated Patient;Spouse    Methods Explanation    Comprehension Verbalized understanding              PT Short Term Goals - 07/28/21 1214       PT SHORT TERM GOAL #1   Title Pt will perform 4 steps with single handrail and step through pattern with supervision in order to demo improved functional mobility. ALL STGS DUE 07/16/21    Baseline 07/28/2021 with B handrails step through with min guard. Min guard/assist with step-to/step through with single hanrail.    Status Partially Met      PT SHORT TERM GOAL #2   Title Pt will undergo further assessment of gait speed with cane with quad tip with LTG written as appropriate.    Baseline 07/28/2021 2.26 ft/sec with straight cane/quad tip and B AFOs    Status Achieved      PT SHORT TERM GOAL #3   Title Pt will ambulate at least 230' with SPC with quad tip and supervision over level surfaces in order to demo improved household mobility.    Baseline 07/28/2021 Pt ambulated ~250' with SPC/quad tip, B AFOs, and min guard.    Status Partially Met               PT Long Term Goals - 08/11/21 1211       PT LONG TERM GOAL #1   Title Pt and pt's caregiver will be independent with final HEP in order to build upon functional gains made in therapy. ALL LTGS DUE 08/13/21    Baseline verbally reviewed- pt reporting performing balance exercises intermittently    Time 8    Period Weeks    Status On-going    Target Date 08/13/21      PT LONG TERM GOAL #2   Title Pt will improve BERG score to at least a 32/56 in order to demo decr fall risk.    Baseline 23/56; 27/56    Time 8    Period Weeks    Status Revised      PT LONG TERM GOAL #3   Title Pt will improve gait speed with SPC with quad tip to at least 2.55 ft/sec in order to demo  improved gait efficiency.    Baseline 2.36 ft/sec with quad tip    Time 8  Period Weeks    Status Revised      PT LONG TERM GOAL #4   Title Pt will improve 5x sit <> stand to 16 seconds or less from mat table with UE support in order to demo improved functional BLE strength for transfers.    Baseline 18.44 seconds on 04/28/21, 22.47 on 05/26/21, 28.25 seconds from mat table with UE support, needing min guard in standing.    Time 8    Period Weeks    Status Not Met      PT LONG TERM GOAL #5   Title Pt will improve gait speed to at least 2.65 ft/sec with RW and B AFOs in order to demo improved gait efficiency and improved community mobility.    Baseline pt using SPC with quad tip with gait speed 2.36 ft/sec on 08/11/21    Time 8    Period Weeks    Status Deferred             Ongoing/updated LTGs:   PT Long Term Goals - 08/11/21 1225       PT LONG TERM GOAL #1   Title Pt and pt's caregiver will be independent with final HEP in order to build upon functional gains made in therapy. ALL LTGS DUE 09/08/21    Baseline verbally reviewed- pt reporting performing balance exercises intermittently, will benefit from updates/revisions.    Time 12    Period Weeks    Status On-going    Target Date 09/08/21      PT LONG TERM GOAL #2   Title Pt will improve BERG score to at least a 32/56 in order to demo decr fall risk.    Baseline 23/56; 27/56    Time 12    Period Weeks    Status On-going      PT LONG TERM GOAL #3   Title Pt will improve gait speed with SPC with quad tip to at least 2.55 ft/sec in order to demo improved gait efficiency.    Baseline 2.36 ft/sec with quad tip    Time 12    Period Weeks    Status Revised      PT LONG TERM GOAL #4   Title Pt will improve 5x sit <> stand to 24 seconds or less from mat table with UE support in order to demo improved functional BLE strength for transfers.    Baseline 28.25 seconds from mat table with UE support, needing min guard in  standing.    Time 12    Period Weeks    Status Revised                   Plan - 08/11/21 1221     Clinical Impression Statement Began to check pt's LTGs. Pt ambulated into session with SPC with quad tip. Checked pt's gait speed with cane at 2.36 ft/sec, indicating a limited community ambulator. Updated pt's LTGs. Pt did not meet LTG #4 in regards to 5x sit <> stand, performed with BUE support in 28.25 seconds from mat table, needing min guard in standing at times for balance. Remainder of session focused on standing balance strategies on compliant surfaces, SLS tasks on level ground. Pt needing intermittent UE support and min guard for balance tasks. To add remaining 4 [facility] visits to POC and pt would like to D/C after end of current visits due to progress. LTGs updated in 12 week POC.    Personal Factors and Comorbidities Comorbidity 3+;Past/Current Experience;Fitness  Comorbidities GBS, a fib, hx of covid.    Examination-Activity Limitations Bathing;Bed Mobility;Caring for [employer];Locomotion Level;Dressing;Hygiene/Grooming;Transfers;Stand;Toileting    Examination-Participation Restrictions Cleaning;Community Activity;Yard Work;Meal Prep;Shop;Laundry;Driving;Occupation    Stability/Clinical Decision Making Evolving/Moderate complexity    Rehab Potential Good    PT Frequency 2x / week    PT Duration 12 weeks    PT Treatment/Interventions Manual techniques;Therapeutic exercise;Gait training;Neuromuscular re-education;Aquatic Therapy;Orthotic Fit/Training    PT Next Visit Plan continue to work on LE strengthing and balance - ankle/hip/stepping strategies and eyes closed/narrow BOS. Dynamic/static balance. Core stabilization/posture exercises.    PT Home Exercise Plan [id number]    Consulted and Agree with Plan of Care Patient;Family member/caregiver    Family Member Consulted pt's wife             Patient will benefit from skilled therapeutic intervention in order to  improve the following deficits and impairments:  Decreased activity tolerance, Decreased coordination, Decreased balance, Decreased endurance, Decreased range of motion, Decreased strength, Difficulty walking, Impaired sensation, Postural dysfunction  Visit Diagnosis: Abnormal posture  Difficulty in walking, not elsewhere classified  Muscle weakness (generalized)  Other symptoms and signs involving the nervous system     Problem List Patient Active Problem List   Diagnosis Date Noted   Acute on chronic respiratory failure with hypoxia (HCC)    Guillain Barr syndrome ([place])    Paroxysmal atrial fibrillation ([place])    Autonomic dysfunction    Severe sepsis ([place])     Arliss Journey, PT, DPT  08/11/2021, 12:25 PM  [facility] [address] [place], [place], [address] Phone: [phone]   Fax:  [phone]  Name: FAIN FRANCIS MRN: [id number] Date of Birth: 1945/10/28

## 2021-08-18 ENCOUNTER — Other Ambulatory Visit: Payer: Self-pay

## 2021-08-18 ENCOUNTER — Encounter: Payer: Self-pay | Admitting: Physical Therapy

## 2021-08-18 ENCOUNTER — Ambulatory Visit: Payer: No Typology Code available for payment source | Admitting: Physical Therapy

## 2021-08-18 DIAGNOSIS — R29818 Other symptoms and signs involving the nervous system: Secondary | ICD-10-CM

## 2021-08-18 DIAGNOSIS — M6281 Muscle weakness (generalized): Secondary | ICD-10-CM

## 2021-08-18 DIAGNOSIS — R293 Abnormal posture: Secondary | ICD-10-CM

## 2021-08-18 DIAGNOSIS — R262 Difficulty in walking, not elsewhere classified: Secondary | ICD-10-CM

## 2021-08-18 NOTE — Therapy (Signed)
[place] [address] [place], [place], [address] Phone: [phone]   Fax:  [phone]  Physical Therapy Treatment  Patient Details  Name: Joseph Mayo MRN: [id number] Date of Birth: 1945-12-18 Referring Provider (PT): Caprice Beaver, MD   Encounter Date: 08/18/2021   PT End of Session - 08/18/21 1104     Visit Number 56    Number of Visits 60    Date for PT Re-Evaluation 09/16/21    Authorization Type [facility]    Authorization Time Period 15 visits through 04/28/21; 15 visits from 03/24/21-07/22/21; 15 visits    Authorization - Visit Number 11    Authorization - Number of Visits 15    PT Start Time 1103    PT Stop Time 1144    PT Time Calculation (min) 41 min    Equipment Utilized During Treatment Gait belt    Activity Tolerance Patient tolerated treatment well    Behavior During Therapy WFL for tasks assessed/performed             Past Medical History:  Diagnosis Date   Acute on chronic respiratory failure with hypoxia (HCC)    Autonomic dysfunction    Guillain Barr syndrome ([place])    Paroxysmal atrial fibrillation ([place])    Severe sepsis ([place])     History reviewed. No pertinent surgical history.  There were no vitals filed for this visit.   Subjective Assessment - 08/18/21 1105     Subjective Joined the [employer] and went swimming. Had a fall, was in a restaurant and not wearing his AFOs and tripped over his feet. Had to have people help him up. Has been wearing his AFOs ever since.    Patient is accompained by: Family member   spouse   Pertinent History GBS, a fib, hx of covid    Limitations Standing;Walking;Sitting    How long can you stand comfortably? about 1-2 minutes at the countertop with daughter assisting.    Patient Stated Goals wants to get up and walk.    Currently in Pain? No/denies                               [facility] Adult PT Treatment/Exercise - 08/18/21 1114        Ambulation/Gait   Ambulation/Gait Yes    Ambulation/Gait Assistance 5: Supervision;4: Min guard    Ambulation/Gait Assistance Details With use of bilat AFOs, cane with 4 prong tip, performed scanning environment, no LOB. plus additional distances throughout session.    Ambulation Distance (Feet) 115 Feet    Assistive device Straight cane   with 4 prong tip   Gait Pattern Step-through pattern;Decreased stride length;Decreased step length - right;Decreased step length - left;Decreased dorsiflexion - right;Decreased dorsiflexion - left;Right steppage;Left steppage;Narrow base of support    Ambulation Surface Level;Indoor      Knee/Hip Exercises: Aerobic   Other Aerobic [place] UE/LE's for strengthening/activity tolerance on level 4.5 x 7 minutes with goal >/= 60 steps per minute. Pt recently joined [employer], discussed pt using a seated stepper/seated recumbent bike as a means of aerobic activity.                 Balance Exercises - 08/18/21 1142       Balance Exercises: Standing   Standing Eyes Opened Foam/compliant surface    Standing Eyes Opened Limitations On single pillow: feet together 2 x30 seconds- cued for posture, feet apart  and head turns x10 reps, head nods x10 reps with single tap to chair for balance.    Standing Eyes Closed Wide (BOA);Foam/compliant surface    Standing Eyes Closed Limitations On single pillow 4 x 20 seconds, intermittent fingertip support.    Stepping Strategy Anterior;Posterior;Foam/compliant surface;Lateral;UE support;10 reps;Limitations    Stepping Strategy Limitations On blue mat; alternating forward stepping to visual cue x10 reps each side, x10 reps each side posterior, x5 reps each side lateral stepping with intermittent UE support as needed for balance, performed in // bars    Other Standing Exercises Standing on single pillow in corner reaching outside of BOS to R/L to grab bean bag and then tossing  into crate x20 reps, intermittent taps to  wall/chair as needed.                PT Education - 08/18/21 1154     Education Details Importance of wearing bilat AFOs at all times for safety/decr fall risk.    Person(s) Educated Patient;Spouse    Methods Explanation    Comprehension Verbalized understanding              PT Short Term Goals - 07/28/21 1214       PT SHORT TERM GOAL #1   Title Pt will perform 4 steps with single handrail and step through pattern with supervision in order to demo improved functional mobility. ALL STGS DUE 07/16/21    Baseline 07/28/2021 with B handrails step through with min guard. Min guard/assist with step-to/step through with single hanrail.    Status Partially Met      PT SHORT TERM GOAL #2   Title Pt will undergo further assessment of gait speed with cane with quad tip with LTG written as appropriate.    Baseline 07/28/2021 2.26 ft/sec with straight cane/quad tip and B AFOs    Status Achieved      PT SHORT TERM GOAL #3   Title Pt will ambulate at least 230' with SPC with quad tip and supervision over level surfaces in order to demo improved household mobility.    Baseline 07/28/2021 Pt ambulated ~250' with SPC/quad tip, B AFOs, and min guard.    Status Partially Met               PT Long Term Goals - 08/11/21 1225       PT LONG TERM GOAL #1   Title Pt and pt's caregiver will be independent with final HEP in order to build upon functional gains made in therapy. ALL LTGS DUE 09/08/21    Baseline verbally reviewed- pt reporting performing balance exercises intermittently, will benefit from updates/revisions.    Time 12    Period Weeks    Status On-going    Target Date 09/08/21      PT LONG TERM GOAL #2   Title Pt will improve BERG score to at least a 32/56 in order to demo decr fall risk.    Baseline 23/56; 27/56    Time 12    Period Weeks    Status On-going      PT LONG TERM GOAL #3   Title Pt will improve gait speed with SPC with quad tip to at least 2.55 ft/sec  in order to demo improved gait efficiency.    Baseline 2.36 ft/sec with quad tip    Time 12    Period Weeks    Status Revised      PT LONG TERM GOAL #4   Title Pt  will improve 5x sit <> stand to 24 seconds or less from mat table with UE support in order to demo improved functional BLE strength for transfers.    Baseline 28.25 seconds from mat table with UE support, needing min guard in standing.    Time 12    Period Weeks    Status Revised                   Plan - 08/18/21 1155     Clinical Impression Statement Today's skilled session focused on BLE strengthening/endurance, and balance strategies. With scanning during gait, pt with no LOB, just needing min guard. Pt still challenged by balance with vision removed and on a single pillow, needing intermittent UE support throughout for balance. Will continue to progress towards LTGs.    Personal Factors and Comorbidities Comorbidity 3+;Past/Current Experience;Fitness    Comorbidities GBS, a fib, hx of covid.    Examination-Activity Limitations Bathing;Bed Mobility;Caring for [employer];Locomotion Level;Dressing;Hygiene/Grooming;Transfers;Stand;Toileting    Examination-Participation Restrictions Cleaning;Community Activity;Yard Work;Meal Prep;Shop;Laundry;Driving;Occupation    Stability/Clinical Decision Making Evolving/Moderate complexity    Rehab Potential Good    PT Frequency 2x / week    PT Duration 12 weeks    PT Treatment/Interventions Manual techniques;Therapeutic exercise;Gait training;Neuromuscular re-education;Aquatic Therapy;Orthotic Fit/Training    PT Next Visit Plan give HEP for the pool as pt has joined the [facility]. continue to work on LE strengthing and balance - ankle/hip/stepping strategies and eyes closed/narrow BOS. Dynamic/static balance. Core stabilization/posture exercises.    PT Home Exercise Plan [id number]    Consulted and Agree with Plan of Care Patient;Family member/caregiver    Family Member  Consulted pt's wife             Patient will benefit from skilled therapeutic intervention in order to improve the following deficits and impairments:  Decreased activity tolerance, Decreased coordination, Decreased balance, Decreased endurance, Decreased range of motion, Decreased strength, Difficulty walking, Impaired sensation, Postural dysfunction  Visit Diagnosis: Abnormal posture  Difficulty in walking, not elsewhere classified  Muscle weakness (generalized)  Other symptoms and signs involving the nervous system     Problem List Patient Active Problem List   Diagnosis Date Noted   Acute on chronic respiratory failure with hypoxia (HCC)    Guillain Barr syndrome ([place])    Paroxysmal atrial fibrillation ([place])    Autonomic dysfunction    Severe sepsis ([place])     Arliss Journey, PT, DPT 08/18/2021, 11:57 AM  [place] [address] [place], [place], [address] Phone: [phone]   Fax:  [phone]  Name: Joseph Mayo MRN: [id number] Date of Birth: 02/15/1946

## 2021-08-25 ENCOUNTER — Ambulatory Visit: Payer: No Typology Code available for payment source | Attending: Family Medicine | Admitting: Physical Therapy

## 2021-08-25 ENCOUNTER — Other Ambulatory Visit: Payer: Self-pay

## 2021-08-25 DIAGNOSIS — R29818 Other symptoms and signs involving the nervous system: Secondary | ICD-10-CM | POA: Insufficient documentation

## 2021-08-25 DIAGNOSIS — M6281 Muscle weakness (generalized): Secondary | ICD-10-CM | POA: Insufficient documentation

## 2021-08-25 DIAGNOSIS — R262 Difficulty in walking, not elsewhere classified: Secondary | ICD-10-CM | POA: Diagnosis present

## 2021-08-25 DIAGNOSIS — R293 Abnormal posture: Secondary | ICD-10-CM | POA: Diagnosis present

## 2021-08-25 NOTE — Therapy (Addendum)
[facility] [address] [place], [place], [address] Phone: [phone]   Fax:  [phone]  Physical Therapy Treatment  Patient Details  Name: Joseph Mayo MRN: [id number] Date of Birth: 02-Mar-1946 Referring Provider (PT): Caprice Beaver, MD   Encounter Date: 08/25/2021   PT End of Session - 08/25/21 1320     Visit Number 75    Number of Visits 58    Date for PT Re-Evaluation 09/16/21    Authorization Type [facility]    Authorization Time Period 15 visits through 04/28/21; 15 visits from 03/24/21-07/22/21; 15 visits    Authorization - Visit Number 12    Authorization - Number of Visits 15    PT Start Time 1318    PT Stop Time 1359    PT Time Calculation (min) 41 min    Equipment Utilized During Treatment Gait belt    Activity Tolerance Patient tolerated treatment well    Behavior During Therapy WFL for tasks assessed/performed             Past Medical History:  Diagnosis Date   Acute on chronic respiratory failure with hypoxia (HCC)    Autonomic dysfunction    Guillain Barr syndrome ([place])    Paroxysmal atrial fibrillation ([place])    Severe sepsis ([place])     No past surgical history on file.  There were no vitals filed for this visit.   Subjective Assessment - 08/25/21 1320     Subjective Went to the [facility] and went swimming, it went well. Had an almost fall - was working out in [employer], had to hold onto a branch to keep his balance.    Patient is accompained by: Family member   spouse   Pertinent History GBS, a fib, hx of covid    Limitations Standing;Walking;Sitting    How long can you stand comfortably? about 1-2 minutes at the countertop with daughter assisting.    Patient Stated Goals wants to get up and walk.    Currently in Pain? No/denies                            Finalized pt's HEP for D/C next week for strength/balance. See MedBridge fore more details.   Access Code:  [email or web address] URL: [email or web address] Date: 08/25/2021 Prepared by: Janann August  Program Notes Wall bumps: standing with wall behind you with feet apart, bumping bottom against the wall and then bringing it away from wall with control/getting tall, perform 2 sets of 10    Exercises Mini Squats with Walker and Chair - 1 x daily - 5 x weekly - 1-2 sets - 10 reps Single Leg Bridge - 1 x daily - 5 x weekly - 2 sets - 5 reps Clamshell with Resistance - 1 x daily - 5 x weekly - 2 sets - 10 reps Standing Balance in Corner with Eyes Closed - 1 x daily - 5 x weekly - 3 sets - 20-30 hold Standing with Head Rotation - 1 x daily - 5 x weekly - 2 sets - 10 reps Standing Hip Abduction with Counter Support - 1 x daily - 5 x weekly - 2 sets - 10 reps Standing Single Leg Stance with Counter Support - 1 x daily - 5 x weekly - 3 sets - 10 hold Standing Romberg to 1/4 Tandem Stance - 1 x daily - 5 x weekly - 3 sets - 20 hold  Balance Exercises - 08/25/21 1401       Balance Exercises: Standing   Rockerboard Lateral;EO    Rockerboard Limitations In lateral direction: static holds 2 x 30 seconds, weight shifting x10 reps each side, reaching outside of BOS to tap cone (either on ipsilateral side or across body) x10 reps each side, needing intermittent UE support to bars for balance.    Heel Raises 10 reps;Both                PT Education - 08/25/21 1402     Education Details Finalized HEP    Person(s) Educated Patient;Spouse    Methods Explanation;Demonstration;Handout    Comprehension Verbalized understanding;Returned demonstration              PT Short Term Goals - 07/28/21 1214       PT SHORT TERM GOAL #1   Title Pt will perform 4 steps with single handrail and step through pattern with supervision in order to demo improved functional mobility. ALL STGS DUE 07/16/21    Baseline 07/28/2021 with B handrails step through with min guard. Min guard/assist with  step-to/step through with single hanrail.    Status Partially Met      PT SHORT TERM GOAL #2   Title Pt will undergo further assessment of gait speed with cane with quad tip with LTG written as appropriate.    Baseline 07/28/2021 2.26 ft/sec with straight cane/quad tip and B AFOs    Status Achieved      PT SHORT TERM GOAL #3   Title Pt will ambulate at least 230' with SPC with quad tip and supervision over level surfaces in order to demo improved household mobility.    Baseline 07/28/2021 Pt ambulated ~250' with SPC/quad tip, B AFOs, and min guard.    Status Partially Met               PT Long Term Goals - 08/11/21 1225       PT LONG TERM GOAL #1   Title Pt and pt's caregiver will be independent with final HEP in order to build upon functional gains made in therapy. ALL LTGS DUE 09/08/21    Baseline verbally reviewed- pt reporting performing balance exercises intermittently, will benefit from updates/revisions.    Time 12    Period Weeks    Status On-going    Target Date 09/08/21      PT LONG TERM GOAL #2   Title Pt will improve BERG score to at least a 32/56 in order to demo decr fall risk.    Baseline 23/56; 27/56    Time 12    Period Weeks    Status On-going      PT LONG TERM GOAL #3   Title Pt will improve gait speed with SPC with quad tip to at least 2.55 ft/sec in order to demo improved gait efficiency.    Baseline 2.36 ft/sec with quad tip    Time 12    Period Weeks    Status Revised      PT LONG TERM GOAL #4   Title Pt will improve 5x sit <> stand to 24 seconds or less from mat table with UE support in order to demo improved functional BLE strength for transfers.    Baseline 28.25 seconds from mat table with UE support, needing min guard in standing.    Time 12    Period Weeks    Status Revised  Plan - 08/25/21 1547     Clinical Impression Statement Today's skilled session focused on finalizing pt's HEP for strength/balance due  to anticipated D/C in the next couple of visits. Pt still remains challenged by balance with eyes closed and with more narrow BOS. All balance activities at home to be performed in the corner. Pt tolerated session well, will continue to progress towards LTGs.    Personal Factors and Comorbidities Comorbidity 3+;Past/Current Experience;Fitness    Comorbidities GBS, a fib, hx of covid.    Examination-Activity Limitations Bathing;Bed Mobility;Caring for [employer];Locomotion Level;Dressing;Hygiene/Grooming;Transfers;Stand;Toileting    Examination-Participation Restrictions Cleaning;Community Activity;Yard Work;Meal Prep;Shop;Laundry;Driving;Occupation    Stability/Clinical Decision Making Evolving/Moderate complexity    Rehab Potential Good    PT Frequency 2x / week    PT Duration 12 weeks    PT Treatment/Interventions Manual techniques;Therapeutic exercise;Gait training;Neuromuscular re-education;Aquatic Therapy;Orthotic Fit/Training    PT Next Visit Plan give HEP for the pool as pt has joined the [facility]. begin to check LTGs with anticipated D/C.  continue to work on LE strengthing and balance - ankle/hip/stepping strategies and eyes closed/narrow BOS. Dynamic/static balance. Core stabilization/posture exercises.    PT Home Exercise Plan [email or web address]    Consulted and Agree with Plan of Care Patient;Family member/caregiver    Family Member Consulted pt's wife             Patient will benefit from skilled therapeutic intervention in order to improve the following deficits and impairments:  Decreased activity tolerance, Decreased coordination, Decreased balance, Decreased endurance, Decreased range of motion, Decreased strength, Difficulty walking, Impaired sensation, Postural dysfunction  Visit Diagnosis: Abnormal posture  Difficulty in walking, not elsewhere classified  Muscle weakness (generalized)  Other symptoms and signs involving the nervous system     Problem List Patient  Active Problem List   Diagnosis Date Noted   Acute on chronic respiratory failure with hypoxia (HCC)    Guillain Barr syndrome ([place])    Paroxysmal atrial fibrillation ([place])    Autonomic dysfunction    Severe sepsis ([place])     Arliss Journey, PT, DPT  08/25/2021, 3:51 PM  [facility] [address] [place], [place], [address] Phone: [phone]   Fax:  [phone]  Name: Joseph Mayo MRN: [id number] Date of Birth: [birth year]

## 2021-08-25 NOTE — Patient Instructions (Signed)
Access Code: [email or web address] URL: [email or web address] Date: 08/25/2021 Prepared by: Janann August  Program Notes Wall bumps: standing with wall behind you with feet apart, bumping bottom against the wall and then bringing it away from wall with control/getting tall, perform 2 sets of 10    Exercises Mini Squats with Walker and Chair - 1 x daily - 5 x weekly - 1-2 sets - 10 reps Single Leg Bridge - 1 x daily - 5 x weekly - 2 sets - 5 reps Clamshell with Resistance - 1 x daily - 5 x weekly - 2 sets - 10 reps Standing Balance in Corner with Eyes Closed - 1 x daily - 5 x weekly - 3 sets - 20-30 hold Standing with Head Rotation - 1 x daily - 5 x weekly - 2 sets - 10 reps Standing Hip Abduction with Counter Support - 1 x daily - 5 x weekly - 2 sets - 10 reps Standing Single Leg Stance with Counter Support - 1 x daily - 5 x weekly - 3 sets - 10 hold Standing Romberg to 1/4 Tandem Stance - 1 x daily - 5 x weekly - 3 sets - 20 hold

## 2021-08-30 ENCOUNTER — Encounter: Payer: Self-pay | Admitting: Physical Therapy

## 2021-08-30 ENCOUNTER — Other Ambulatory Visit: Payer: Self-pay

## 2021-08-30 ENCOUNTER — Ambulatory Visit: Payer: No Typology Code available for payment source | Admitting: Physical Therapy

## 2021-08-30 DIAGNOSIS — R293 Abnormal posture: Secondary | ICD-10-CM | POA: Diagnosis not present

## 2021-08-30 DIAGNOSIS — M6281 Muscle weakness (generalized): Secondary | ICD-10-CM

## 2021-08-30 DIAGNOSIS — R29818 Other symptoms and signs involving the nervous system: Secondary | ICD-10-CM

## 2021-08-30 DIAGNOSIS — R262 Difficulty in walking, not elsewhere classified: Secondary | ICD-10-CM

## 2021-08-30 NOTE — Therapy (Signed)
[facility] [address] [place], [place], [address] Phone: [phone]   Fax:  [phone]  Physical Therapy Treatment  Patient Details  Name: Joseph Mayo MRN: [id number] Date of Birth: 06-29-1946 Referring Provider (PT): Caprice Beaver, MD   Encounter Date: 08/30/2021   PT End of Session - 08/30/21 2300     Visit Number 68    Number of Visits 60    Date for PT Re-Evaluation 09/16/21    Authorization Type VA    Authorization Time Period 15 visits through 04/28/21; 15 visits from 03/24/21-07/22/21; 15 visits    Authorization - Visit Number [id number]    Authorization - Number of Visits 15    PT Start Time 1150   pt running late for appt today   PT Stop Time 1229    PT Time Calculation (min) 39 min    Equipment Utilized During Treatment Other (comment)   aquatic step, aquatic weights, foam noodle, single bar bells   Activity Tolerance Patient tolerated treatment well    Behavior During Therapy WFL for tasks assessed/performed             Past Medical History:  Diagnosis Date   Acute on chronic respiratory failure with hypoxia (HCC)    Autonomic dysfunction    Guillain Barr syndrome ([place])    Paroxysmal atrial fibrillation ([place])    Severe sepsis ([place])     History reviewed. No pertinent surgical history.  There were no vitals filed for this visit.   Subjective Assessment - 08/30/21 2250     Subjective No new complaints. No falls or pain to report.    Patient is accompained by: Family member   spouse   Pertinent History GBS, a fib, hx of covid    Limitations Standing;Walking;Sitting    Patient Stated Goals wants to get up and walk.    Currently in Pain? No/denies             Aquatic therapy at [place] temp 96 degrees   Patient seen for aquatic therapy today.  Treatment took place in water 3.5-4.5 feet deep depending upon activity.  Pt entered/exited the pool via bil rails with step to step  pattern.  Min HHA for gait from stairs to bench in water Seated at edge of bench Bil hamstring stretching with foot on aquatic step for 30 sec's x 3 reps with assist to maintain knee extension.  With 2# ankle weights- long arc quads and hip abduction/adduction for 10 reps each.   HHA for gait into ~4.3 foot depth of water UE support at wall for following ex's to be issued to aquatic HEP for 10 reps each Heel raises Hip abduction Hip extension Alternating marching  With blue foam noodle in ~4.0 to 4.3 depth water Forward walking Backward walking Side stepping Forward walking marching Forward toe walking Forward tandem gait    Pt requires buoyancy of water for support for reduced fall risk with gait training and balance exercises with minimal UE support; exercises able to be performed safely in water without the risk of fall compared to those same exercises performed on land;  viscosity of water needed for resistance for strengthening.  Current of water provides perturbations for challenging static & dynamic standing balance.                                PT Short Term Goals - 07/28/21 1214  PT SHORT TERM GOAL #1   Title Pt will perform 4 steps with single handrail and step through pattern with supervision in order to demo improved functional mobility. ALL STGS DUE 07/16/21    Baseline 07/28/2021 with B handrails step through with min guard. Min guard/assist with step-to/step through with single hanrail.    Status Partially Met      PT SHORT TERM GOAL #2   Title Pt will undergo further assessment of gait speed with cane with quad tip with LTG written as appropriate.    Baseline 07/28/2021 2.26 ft/sec with straight cane/quad tip and B AFOs    Status Achieved      PT SHORT TERM GOAL #3   Title Pt will ambulate at least 230' with SPC with quad tip and supervision over level surfaces in order to demo improved household mobility.    Baseline 07/28/2021  Pt ambulated ~250' with SPC/quad tip, B AFOs, and min guard.    Status Partially Met               PT Long Term Goals - 08/11/21 1225       PT LONG TERM GOAL #1   Title Pt and pt's caregiver will be independent with final HEP in order to build upon functional gains made in therapy. ALL LTGS DUE 09/08/21    Baseline verbally reviewed- pt reporting performing balance exercises intermittently, will benefit from updates/revisions.    Time 12    Period Weeks    Status On-going    Target Date 09/08/21      PT LONG TERM GOAL #2   Title Pt will improve BERG score to at least a 32/56 in order to demo decr fall risk.    Baseline 23/56; 27/56    Time 12    Period Weeks    Status On-going      PT LONG TERM GOAL #3   Title Pt will improve gait speed with SPC with quad tip to at least 2.55 ft/sec in order to demo improved gait efficiency.    Baseline 2.36 ft/sec with quad tip    Time 12    Period Weeks    Status Revised      PT LONG TERM GOAL #4   Title Pt will improve 5x sit <> stand to 24 seconds or less from mat table with UE support in order to demo improved functional BLE strength for transfers.    Baseline 28.25 seconds from mat table with UE support, needing min guard in standing.    Time 12    Period Weeks    Status Revised                   Plan - 08/30/21 2320     Personal Factors and Comorbidities Comorbidity 3+;Past/Current Experience;Fitness    Comorbidities GBS, a fib, hx of covid.    Examination-Activity Limitations Bathing;Bed Mobility;Caring for [employer];Locomotion Level;Dressing;Hygiene/Grooming;Transfers;Stand;Toileting    Examination-Participation Restrictions Cleaning;Community Activity;Yard Work;Meal Prep;Shop;Laundry;Driving;Occupation    Stability/Clinical Decision Making Evolving/Moderate complexity    Rehab Potential Good    PT Frequency 2x / week    PT Duration 12 weeks    PT Treatment/Interventions Manual techniques;Therapeutic  exercise;Gait training;Neuromuscular re-education;Aquatic Therapy;Orthotic Fit/Training    PT Next Visit Plan give HEP for the pool as pt has joined the [facility] begin to check LTGs with anticipated D/C.  continue to work on LE strengthing and balance - ankle/hip/stepping strategies and eyes closed/narrow BOS. Dynamic/static balance. Core stabilization/posture exercises.  PT Home Exercise Plan [email or web address]    Consulted and Agree with Plan of Care Patient;Family member/caregiver    Family Member Consulted pt's wife             Patient will benefit from skilled therapeutic intervention in order to improve the following deficits and impairments:  Decreased activity tolerance, Decreased coordination, Decreased balance, Decreased endurance, Decreased range of motion, Decreased strength, Difficulty walking, Impaired sensation, Postural dysfunction  Visit Diagnosis: Difficulty in walking, not elsewhere classified  Muscle weakness (generalized)  Other symptoms and signs involving the nervous system     Problem List Patient Active Problem List   Diagnosis Date Noted   Acute on chronic respiratory failure with hypoxia ([place])    Guillain Barr syndrome ([place])    Paroxysmal atrial fibrillation ([place])    Autonomic dysfunction    Severe sepsis ([place])     Willow Ora, PTA, [facility] [address], [place], [place] [address] [phone] 08/30/21, 11:21 PM   Name: DAAIEL STARLIN MRN: [id number] Date of Birth: [birth year]

## 2021-09-01 ENCOUNTER — Encounter: Payer: Self-pay | Admitting: Physical Therapy

## 2021-09-01 ENCOUNTER — Other Ambulatory Visit: Payer: Self-pay

## 2021-09-01 ENCOUNTER — Ambulatory Visit: Payer: No Typology Code available for payment source | Admitting: Physical Therapy

## 2021-09-01 DIAGNOSIS — R29818 Other symptoms and signs involving the nervous system: Secondary | ICD-10-CM

## 2021-09-01 DIAGNOSIS — R262 Difficulty in walking, not elsewhere classified: Secondary | ICD-10-CM

## 2021-09-01 DIAGNOSIS — R293 Abnormal posture: Secondary | ICD-10-CM

## 2021-09-01 DIAGNOSIS — M6281 Muscle weakness (generalized): Secondary | ICD-10-CM

## 2021-09-01 NOTE — Therapy (Signed)
[facility] [address] [place], [place], [address] Phone: [phone]   Fax:  [phone]  Physical Therapy Treatment  Patient Details  Name: Joseph Mayo MRN: [id number] Date of Birth: 04-Nov-1945 Referring Provider (PT): Sim Boast, MD   Encounter Date: 09/01/2021   PT End of Session - 09/01/21 1109     Visit Number 59    Number of Visits 60    Date for PT Re-Evaluation 09/16/21    Authorization Type [facility]    Authorization Time Period 15 visits through 04/28/21; 15 visits from 03/24/21-07/22/21; 15 visits    Authorization - Visit Number 14    Authorization - Number of Visits 15    PT Start Time 1107   pt late to appt   PT Stop Time 1145    PT Time Calculation (min) 38 min    Equipment Utilized During Treatment Gait belt    Activity Tolerance Patient tolerated treatment well    Behavior During Therapy WFL for tasks assessed/performed             Past Medical History:  Diagnosis Date   Acute on chronic respiratory failure with hypoxia (HCC)    Autonomic dysfunction    Guillain Barr syndrome (HCC)    Paroxysmal atrial fibrillation (HCC)    Severe sepsis (HCC)     History reviewed. No pertinent surgical history.  There were no vitals filed for this visit.       [facility] PT Assessment - 09/01/21 1113       Berg Balance Test   Sit to Stand Able to stand  independently using hands    Standing Unsupported Able to stand safely 2 minutes    Sitting with Back Unsupported but Feet Supported on Floor or Stool Able to sit safely and securely 2 minutes    Stand to Sit Sits safely with minimal use of hands    Transfers Able to transfer safely, definite need of hands    Standing Unsupported with Eyes Closed Able to stand 10 seconds with supervision    Standing Unsupported with Feet Together Able to place feet together independently and stand for 1 minute with supervision    From Standing, Reach Forward with  Outstretched Arm Can reach confidently >25 cm (10")    From Standing Position, Pick up Object from Floor Unable to pick up and needs supervision    From Standing Position, Turn to Look Behind Over each Shoulder Turn sideways only but maintains balance    Turn 360 Degrees Able to turn 360 degrees safely but slowly   10.92 to L, 12.92 to R   Standing Unsupported, Alternately Place Feet on Step/Stool Able to complete 4 steps without aid or supervision    Standing Unsupported, One Foot in Front Able to take small step independently and hold 30 seconds    Standing on One Leg Tries to lift leg/unable to hold 3 seconds but remains standing independently    Total Score 38    Berg comment: 38/56 = significant fall risk                           [facility] Adult PT Treatment/Exercise - 09/01/21 1113       Transfers   Transfers Sit to Stand;Stand to Sit    Sit to Stand 4: Min guard;5: Supervision;With upper extremity assist    Five time sit to stand comments  19.56 seconds with BUE support  Stand to Sit 4: Min guard;5: Supervision;With upper extremity assist      Ambulation/Gait   Ambulation/Gait Yes    Ambulation/Gait Assistance 5: Supervision    Assistive device Straight cane   with 4 prong tip   Gait Pattern Step-through pattern;Decreased stride length;Decreased step length - right;Decreased step length - left;Decreased dorsiflexion - right;Decreased dorsiflexion - left;Right steppage;Left steppage;Narrow base of support    Ambulation Surface Level;Indoor    Gait velocity 13.59 seconds = 2.41 ft/sec                 Balance Exercises - 09/01/21 1142       Balance Exercises: Standing   Standing Eyes Closed Wide (BOA);Foam/compliant surface    Standing Eyes Closed Limitations On air ex with wide BOS, ranging from 15-30 seconds x4 reps, with intermittent taps to bars for balance, cued for posture    Other Standing Exercises On air ex: x10 reps A/P weight shifting with  cues for technique and using hip strategy    Other Standing Exercises Comments With air ex: step up/up and down/down alternating legs x10 reps each side, intermittent UE support, min guard for balance.                  PT Short Term Goals - 07/28/21 1214       PT SHORT TERM GOAL #1   Title Pt will perform 4 steps with single handrail and step through pattern with supervision in order to demo improved functional mobility. ALL STGS DUE 07/16/21    Baseline 07/28/2021 with B handrails step through with min guard. Min guard/assist with step-to/step through with single hanrail.    Status Partially Met      PT SHORT TERM GOAL #2   Title Pt will undergo further assessment of gait speed with cane with quad tip with LTG written as appropriate.    Baseline 07/28/2021 2.26 ft/sec with straight cane/quad tip and B AFOs    Status Achieved      PT SHORT TERM GOAL #3   Title Pt will ambulate at least 230' with SPC with quad tip and supervision over level surfaces in order to demo improved household mobility.    Baseline 07/28/2021 Pt ambulated ~250' with SPC/quad tip, B AFOs, and min guard.    Status Partially Met               PT Long Term Goals - 09/01/21 1114       PT LONG TERM GOAL #1   Title Pt and pt's caregiver will be independent with final HEP in order to build upon functional gains made in therapy. ALL LTGS DUE 09/08/21    Baseline reviewed and updated at previous session    Time 12    Period Weeks    Status Achieved    Target Date 09/08/21      PT LONG TERM GOAL #2   Title Pt will improve BERG score to at least a 32/56 in order to demo decr fall risk.    Baseline 23/56; 27/56, 38/56 on 09/01/21    Time 12    Period Weeks    Status Achieved      PT LONG TERM GOAL #3   Title Pt will improve gait speed with SPC with quad tip to at least 2.55 ft/sec in order to demo improved gait efficiency.    Baseline 2.36 ft/sec with quad tip; 13.59 seconds = 2.41 ft/sec    Time 12  Period Weeks    Status Not Met      PT LONG TERM GOAL #4   Title Pt will improve 5x sit <> stand to 24 seconds or less from mat table with UE support in order to demo improved functional BLE strength for transfers.    Baseline 28.25 seconds from mat table with UE support, needing min guard in standing.; 19.56 seconds on 09/01/21    Time 12    Period Weeks    Status Achieved                   Plan - 09/01/21 1155     Clinical Impression Statement Today's skilled session focused on checking pt's LTGs. Pt met 3 out of 4 LTGs. Pt improved is BERG score to a 38/56 (previously 27/56). Pt improved 5x sit <> stand with BUE support from mat table to 19.56 seconds. Pt did not meet goal for gait speed, improved to 2.41 ft/sec with SPC with quad tip, but not quite to goal level. Pt is very pleased with his progress with PT, has met his LTGs, and plan to D/C at his next session. Pt in agreement with plan.    Personal Factors and Comorbidities Comorbidity 3+;Past/Current Experience;Fitness    Comorbidities GBS, a fib, hx of covid.    Examination-Activity Limitations Bathing;Bed Mobility;Caring for [employer];Locomotion Level;Dressing;Hygiene/Grooming;Transfers;Stand;Toileting    Examination-Participation Restrictions Cleaning;Community Activity;Yard Work;Meal Prep;Shop;Laundry;Driving;Occupation    Stability/Clinical Decision Making Evolving/Moderate complexity    Rehab Potential Good    PT Frequency 2x / week    PT Duration 12 weeks    PT Treatment/Interventions Manual techniques;Therapeutic exercise;Gait training;Neuromuscular re-education;Aquatic Therapy;Orthotic Fit/Training    PT Next Visit Plan give aquatic HEP. D/C    PT Home Exercise Plan [email or web address]    Consulted and Agree with Plan of Care Patient;Family member/caregiver    Family Member Consulted pt's wife             Patient will benefit from skilled therapeutic intervention in order to improve the following  deficits and impairments:  Decreased activity tolerance, Decreased coordination, Decreased balance, Decreased endurance, Decreased range of motion, Decreased strength, Difficulty walking, Impaired sensation, Postural dysfunction  Visit Diagnosis: Difficulty in walking, not elsewhere classified  Muscle weakness (generalized)  Other symptoms and signs involving the nervous system  Abnormal posture     Problem List Patient Active Problem List   Diagnosis Date Noted   Acute on chronic respiratory failure with hypoxia (HCC)    Guillain Barr syndrome ([place])    Paroxysmal atrial fibrillation ([place])    Autonomic dysfunction    Severe sepsis ([place])     Arliss Journey, PT, DPT  09/01/2021, 11:59 AM  [facility] [address] [place], [place], [address] Phone: [phone]   Fax:  [phone]  Name: Joseph Mayo MRN: [id number] Date of Birth: 08/17/1946

## 2021-09-13 ENCOUNTER — Ambulatory Visit: Payer: No Typology Code available for payment source | Admitting: Physical Therapy

## 2021-09-13 ENCOUNTER — Other Ambulatory Visit: Payer: Self-pay

## 2021-09-13 DIAGNOSIS — R262 Difficulty in walking, not elsewhere classified: Secondary | ICD-10-CM

## 2021-09-13 DIAGNOSIS — R293 Abnormal posture: Secondary | ICD-10-CM | POA: Diagnosis not present

## 2021-09-13 DIAGNOSIS — R29818 Other symptoms and signs involving the nervous system: Secondary | ICD-10-CM

## 2021-09-13 DIAGNOSIS — M6281 Muscle weakness (generalized): Secondary | ICD-10-CM

## 2021-09-13 NOTE — Therapy (Addendum)
[facility] [address] [place], [place], [address] Phone: [phone]   Fax:  [phone]  Physical Therapy Treatment/Discharge Summary  Patient Details  Name: Joseph Mayo MRN: [id number] Date of Birth: Jan 17, 1946 Referring Provider (PT): Caprice Beaver, MD   Encounter Date: 09/13/2021   PT End of Session - 09/13/21 1630     Visit Number 60    Number of Visits 60    Date for PT Re-Evaluation 09/16/21    Authorization Type VA    Authorization Time Period 15 visits through 04/28/21; 15 visits from 03/24/21-07/22/21; 15 visits    Authorization - Visit Number 79    Authorization - Number of Visits 15    PT Start Time 1150    PT Stop Time 1230    PT Time Calculation (min) 40 min    Equipment Utilized During Treatment Gait belt    Activity Tolerance Patient tolerated treatment well    Behavior During Therapy WFL for tasks assessed/performed             Past Medical History:  Diagnosis Date   Acute on chronic respiratory failure with hypoxia (HCC)    Autonomic dysfunction    Guillain Barr syndrome ([place])    Paroxysmal atrial fibrillation ([place])    Severe sepsis ([place])     History reviewed. No pertinent surgical history.  There were no vitals filed for this visit.   Subjective Assessment - 09/13/21 1630     Subjective No new complaints. No falls or pain to report. Has been going to [facility] a few times a week.    Patient is accompained by: Family member   spouse   Pertinent History GBS, a fib, hx of covid    Limitations Standing;Walking;Sitting    How long can you stand comfortably? about 1-2 minutes at the countertop with daughter assisting.    Patient Stated Goals wants to get up and walk.    Currently in Pain? No/denies             Aquatic therapy at [facility] - pool temp 90 degrees   Patient seen for aquatic therapy today.  Treatment took place in water 3.5-4.5 feet deep depending upon activity.   Pt entered and exited pool with bil rails using step to gait pattern.   Once in water HHA for gait from steps over to bench.    Seated a edge of bench in water:  No weights Sit<>stand with feet in staggered position x 10 each foot forward with min guard to min assist for balance in standing.   With 2# weights on each leg Long arc quads with emphasis on slow, controlled movements With legs in knee extension floating in water pt performed hip abd/add for 15 reps.  With legs in knee extension floating in water for Scissor kicks for 15 reps With pt keeping feet/knees together with feet on floor of pool pt performed flutter kicks as follows- lifting legs up for knee to chest with knee flexion>kicking out for knee extension>back into knee flexion>then lowering feet back to floor of pool x 15 reps for lower abdominal and LE strengthening.   From bench HHA for gait into water at depth of 4.3-4.5 feet Power walking forward with emphasis on arm swing with elbows bent and large steps for 3 laps across pool/back, min guard assist. Backward walking with emphasis on posture and step length with min guard assist for safety for 3 laps across pool/back Sides stepping left<>right for 3  laps across pool/back with emphasis on posture and step length, min guard assist for balance.   With yellow pool noodle for balance assistance with the following:  Forward high knee marching for 3 laps across pool/back with min guard assist for balance Forward tandem walking across pool/back for 3 laps with min guard to min assist for balance. Forward toe walking for 3 laps across pool/back with min guard to min assist for balance.     Pt requires buoyancy of water for support for reduced fall risk with gait training and balance exercises with minimal UE support; exercises able to be performed safely in water without the risk of fall compared to those same exercises performed on land;  viscosity of water needed for  resistance for strengthening.  Current of water provides perturbations for challenging static & dynamic standing balance.          PT Short Term Goals - 07/28/21 1214       PT SHORT TERM GOAL #1   Title Pt will perform 4 steps with single handrail and step through pattern with supervision in order to demo improved functional mobility. ALL STGS DUE 07/16/21    Baseline 07/28/2021 with B handrails step through with min guard. Min guard/assist with step-to/step through with single hanrail.    Status Partially Met      PT SHORT TERM GOAL #2   Title Pt will undergo further assessment of gait speed with cane with quad tip with LTG written as appropriate.    Baseline 07/28/2021 2.26 ft/sec with straight cane/quad tip and B AFOs    Status Achieved      PT SHORT TERM GOAL #3   Title Pt will ambulate at least 230' with SPC with quad tip and supervision over level surfaces in order to demo improved household mobility.    Baseline 07/28/2021 Pt ambulated ~250' with SPC/quad tip, B AFOs, and min guard.    Status Partially Met               PT Long Term Goals - 09/01/21 1114       PT LONG TERM GOAL #1   Title Pt and pt's caregiver will be independent with final HEP in order to build upon functional gains made in therapy. ALL LTGS DUE 09/08/21    Baseline reviewed and updated at previous session    Time 12    Period Weeks    Status Achieved    Target Date 09/08/21      PT LONG TERM GOAL #2   Title Pt will improve BERG score to at least a 32/56 in order to demo decr fall risk.    Baseline 23/56; 27/56, 38/56 on 09/01/21    Time 12    Period Weeks    Status Achieved      PT LONG TERM GOAL #3   Title Pt will improve gait speed with SPC with quad tip to at least 2.55 ft/sec in order to demo improved gait efficiency.    Baseline 2.36 ft/sec with quad tip; 13.59 seconds = 2.41 ft/sec    Time 12    Period Weeks    Status Not Met      PT LONG TERM GOAL #4   Title Pt will improve 5x  sit <> stand to 24 seconds or less from mat table with UE support in order to demo improved functional BLE strength for transfers.    Baseline 28.25 seconds from mat table with UE support, needing min guard  in standing.; 19.56 seconds on 09/01/21    Time 12    Period Weeks    Status Achieved            PHYSICAL THERAPY DISCHARGE SUMMARY  Visits from Start of Care: 60  Current functional level related to goals / functional outcomes: See LTGs   Remaining deficits: Impaired balance, decr strength, gait abnormalities.    Education / Equipment: HEP for land and aquatic therapy.    Patient agrees to discharge. Patient goals were met. Patient is being discharged due to meeting the stated rehab goals. And being please with the current functional level.         Plan - 09/13/21 1630     Clinical Impression Statement Today's skilled session focused on last appoitment being used in the aquatic setting to finalize plan for what pt can do at [facility] pool. Pt is currenlty working on swimming laps. Reviewed some seated ex's pt can do in the [place] as the pool does not have a place to sit down and on various walking/balance tasks pt can do in pool. No issues noted with session. Pt without questions. Pt agreeable to discharge today.    Personal Factors and Comorbidities Comorbidity 3+;Past/Current Experience;Fitness    Comorbidities GBS, a fib, hx of covid.    Examination-Activity Limitations Bathing;Bed Mobility;Caring for [employer];Locomotion Level;Dressing;Hygiene/Grooming;Transfers;Stand;Toileting    Examination-Participation Restrictions Cleaning;Community Activity;Yard Work;Meal Prep;Shop;Laundry;Driving;Occupation    Stability/Clinical Decision Making Evolving/Moderate complexity    Rehab Potential Good    PT Frequency 2x / week    PT Duration 12 weeks    PT Treatment/Interventions Manual techniques;Therapeutic exercise;Gait training;Neuromuscular re-education;Aquatic  Therapy;Orthotic Fit/Training    PT Next Visit Plan discharge    PT Home Exercise Plan [id number]    Consulted and Agree with Plan of Care Patient;Family member/caregiver    Family Member Consulted pt's wife             Patient will benefit from skilled therapeutic intervention in order to improve the following deficits and impairments:  Decreased activity tolerance, Decreased coordination, Decreased balance, Decreased endurance, Decreased range of motion, Decreased strength, Difficulty walking, Impaired sensation, Postural dysfunction  Visit Diagnosis: Difficulty in walking, not elsewhere classified  Muscle weakness (generalized)  Other symptoms and signs involving the nervous system     Problem List Patient Active Problem List   Diagnosis Date Noted   Acute on chronic respiratory failure with hypoxia ([place])    Guillain Barr syndrome ([place])    Paroxysmal atrial fibrillation ([place])    Autonomic dysfunction    Severe sepsis ([place])     Willow Ora, PTA, [facility] [address], [place], [place] [address] [phone] 09/14/21, 9:13 AM   Name: Joseph Mayo MRN: [id number] Date of Birth: 1946/02/24   Discharge done by: Janann August, PT, DPT 09/16/21 11:26 AM

## 2021-09-14 ENCOUNTER — Encounter: Payer: Self-pay | Admitting: Physical Therapy

## 2022-02-04 IMAGING — DX DG ABD PORTABLE 1V
1 series · 1 of 1 positions shown · non-contrast
Comparison: None.

CLINICAL DATA: Percutaneous gastrostomy tube placement.

EXAM:
PORTABLE ABDOMEN - 1 VIEW

[abdomen]
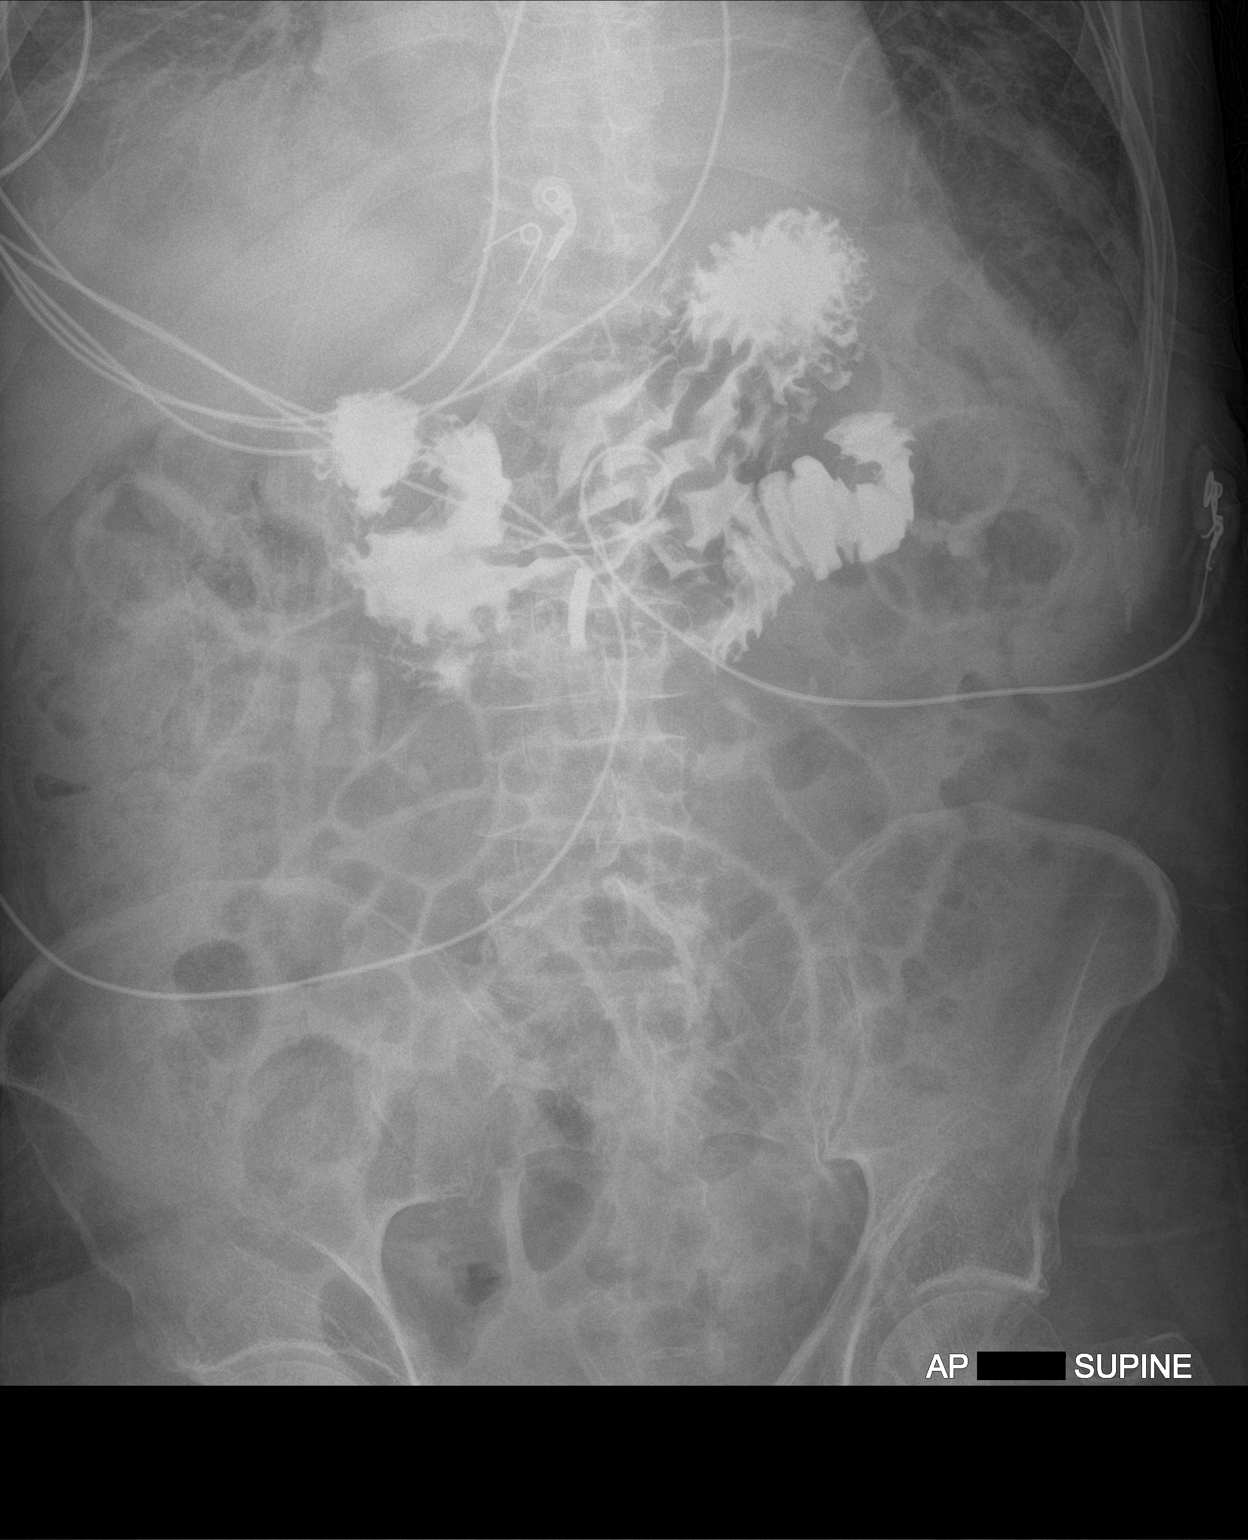

[1 of 1 positions shown; findings below may reference images not displayed]

FINDINGS: A percutaneous gastrostomy tube is seen with its distal end and
insufflator bulb overlying the body of the stomach. The bowel gas
pattern is normal. Radiopaque contrast is seen within the gastric
lumen and proximal duodenum following injection into the previously
noted gastrostomy tube. There is no evidence of contrast
extravasation. No radio-opaque calculi or other significant
radiographic abnormality are seen.
IMPRESSION: Appropriate percutaneous gastrostomy tube positioning, as described
above.

## 2022-02-04 IMAGING — DX DG CHEST 1V PORT
1 series · 2 of 2 positions shown · non-contrast
Comparison: None.

CLINICAL DATA: 74-year-old male with respiratory failure.

EXAM:
PORTABLE CHEST 1 VIEW

[Series 1: chest · 0.14mm/px · 2 of 2 slices shown]
[im 1/2]
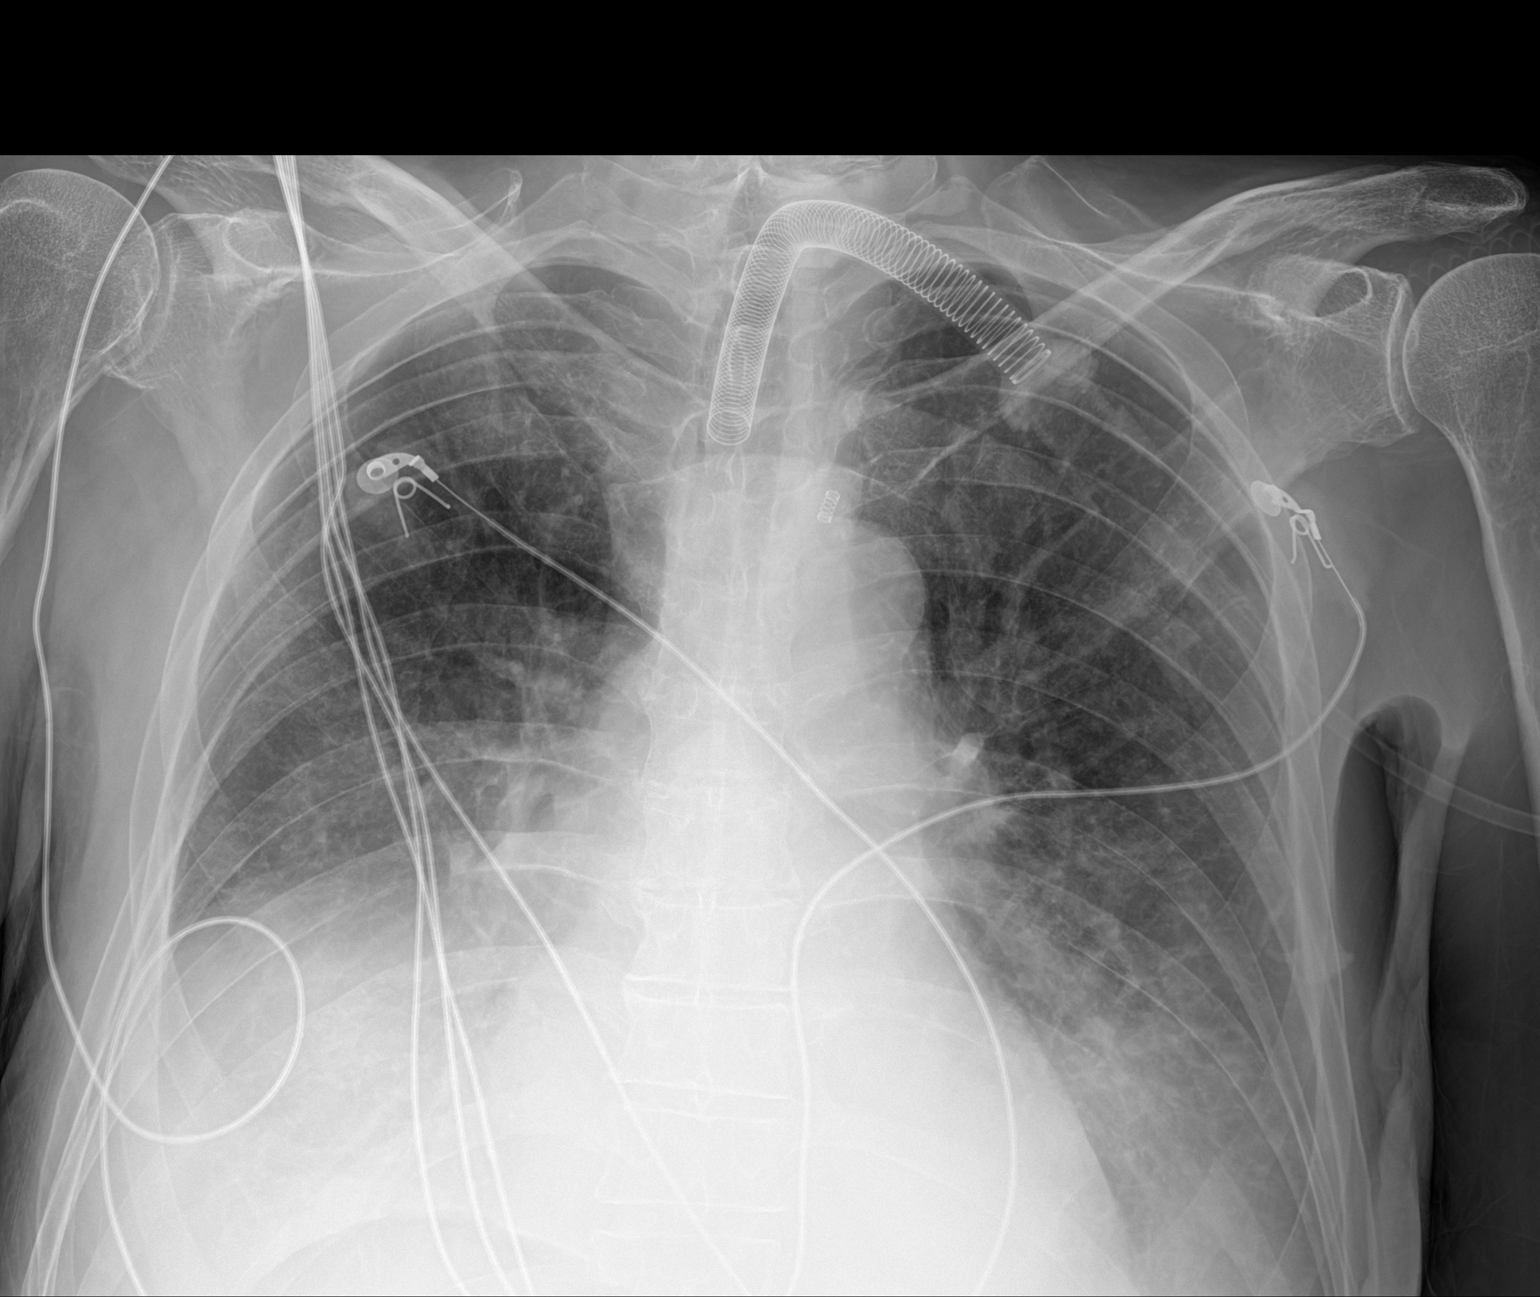
[im 2/2]
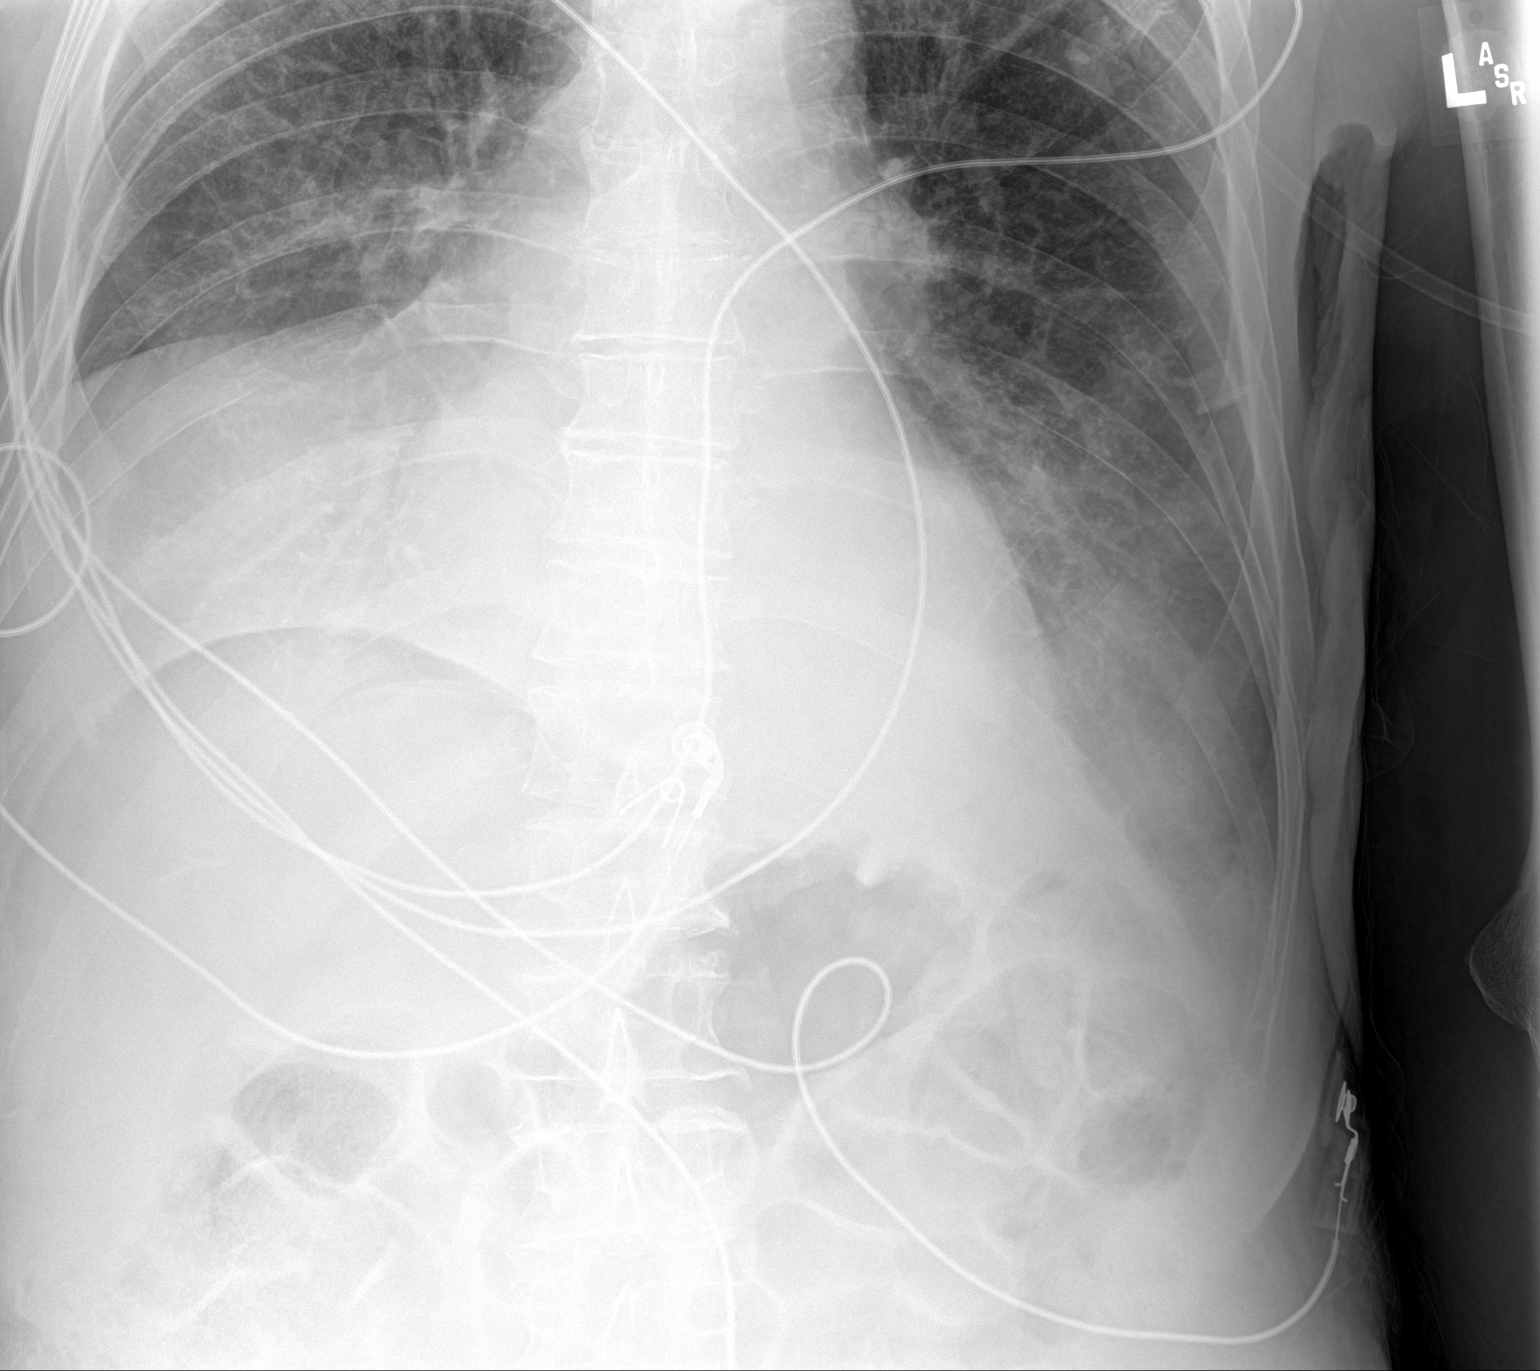

[2 of 2 positions shown; findings below may reference images not displayed]

FINDINGS: Tracheostomy approximately 7 cm above the carina. There is
cardiomegaly with vascular congestion and edema. Small bilateral
pleural effusions with bibasilar atelectasis or infiltrate. No
pneumothorax. No acute osseous pathology.
IMPRESSION: 1. Cardiomegaly with CHF and small bilateral pleural effusions.
2. Tracheostomy above the carina.

## 2022-02-08 IMAGING — DX DG CHEST 1V PORT
1 series · 1 of 1 positions shown · non-contrast
Comparison: Four days ago

CLINICAL DATA: Pleural effusion.  Increased oxygen demand

EXAM:
PORTABLE CHEST 1 VIEW

[chest ap]
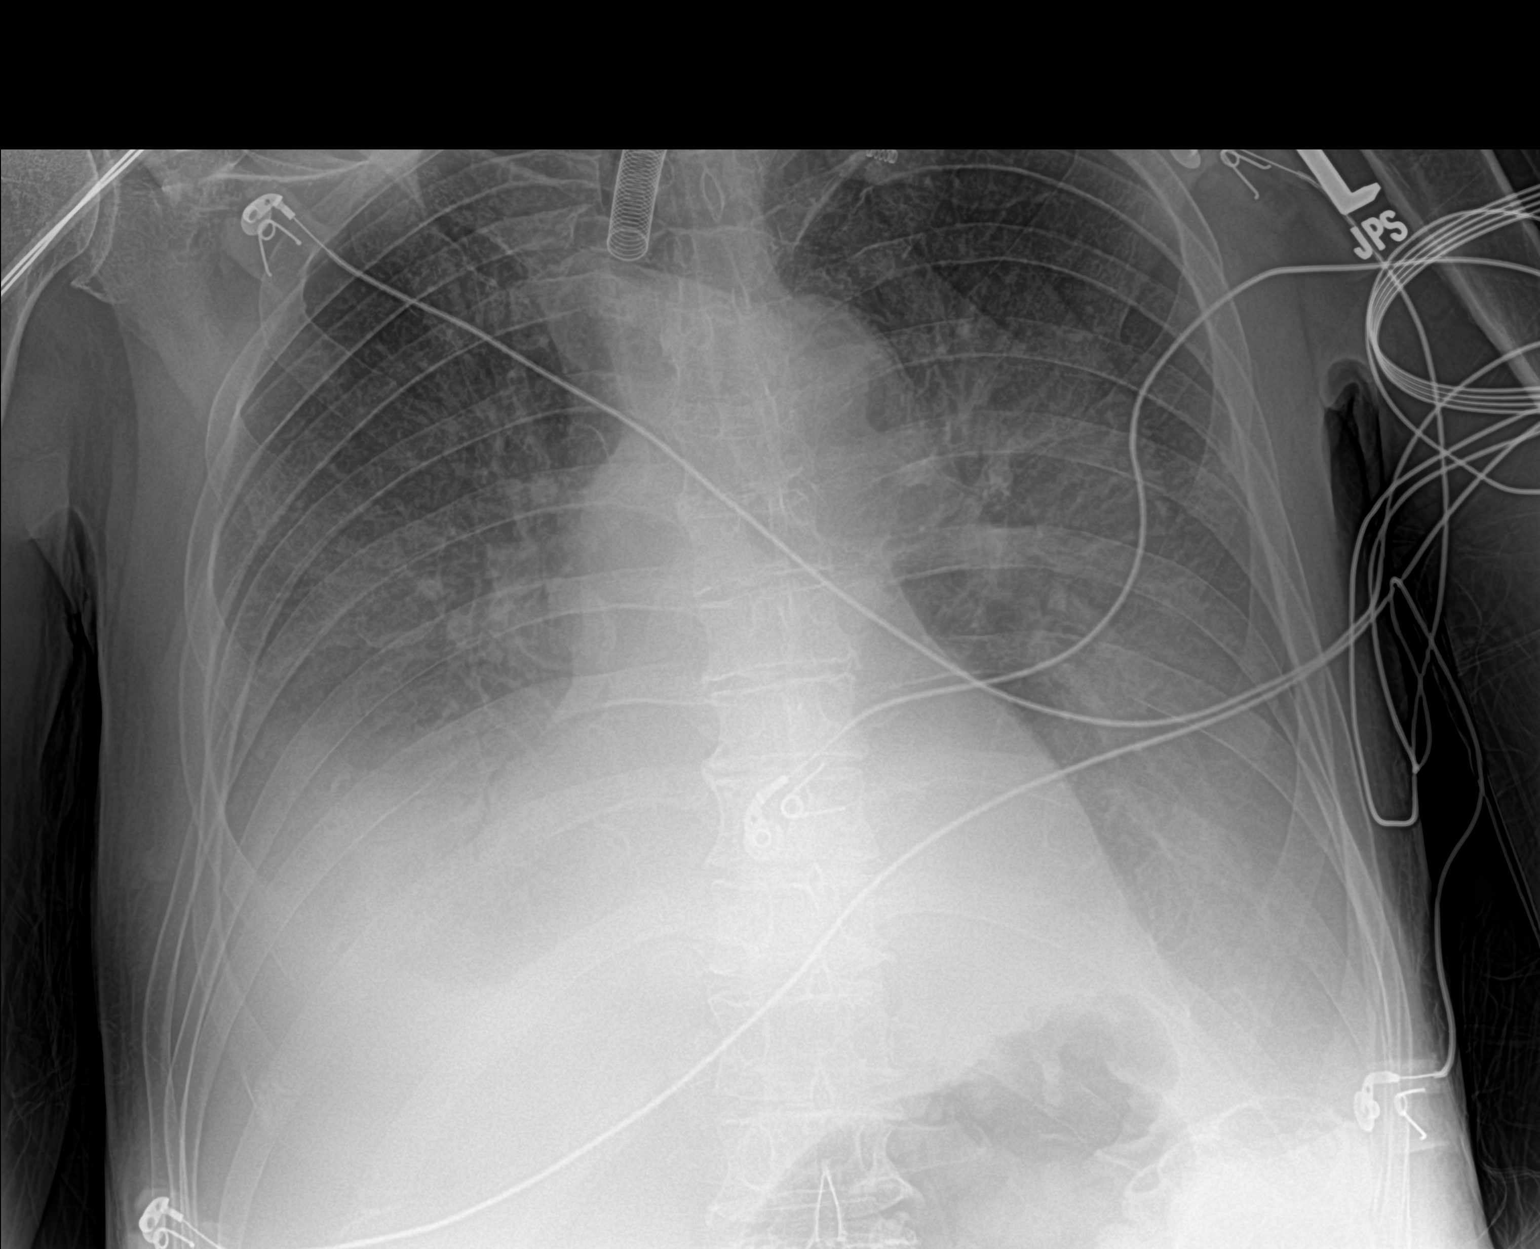

[1 of 1 positions shown; findings below may reference images not displayed]

FINDINGS: Tracheostomy tube in place.

Symmetric hazy opacification of the bilateral lower chest. No
visible air leak. Normal heart size.
IMPRESSION: Unchanged hazy opacification of the bilateral lower chest, usually
atelectasis and pleural effusions.

## 2024-05-28 ENCOUNTER — Emergency Department (HOSPITAL_COMMUNITY)

## 2024-05-28 ENCOUNTER — Inpatient Hospital Stay (HOSPITAL_COMMUNITY)

## 2024-05-28 ENCOUNTER — Other Ambulatory Visit: Payer: Self-pay

## 2024-05-28 ENCOUNTER — Encounter (HOSPITAL_COMMUNITY): Payer: Self-pay | Admitting: Emergency Medicine

## 2024-05-28 ENCOUNTER — Inpatient Hospital Stay (HOSPITAL_COMMUNITY)
Admission: EM | Admit: 2024-05-28 | Discharge: 2024-05-30 | DRG: 314 | Disposition: A | Discharge status: Home / Self Care | Attending: Internal Medicine | Admitting: Internal Medicine

## 2024-05-28 DIAGNOSIS — R748 Abnormal levels of other serum enzymes: Secondary | ICD-10-CM

## 2024-05-28 DIAGNOSIS — I5043 Acute on chronic combined systolic (congestive) and diastolic (congestive) heart failure: Secondary | ICD-10-CM | POA: Diagnosis present

## 2024-05-28 DIAGNOSIS — I509 Heart failure, unspecified: Secondary | ICD-10-CM | POA: Diagnosis not present

## 2024-05-28 DIAGNOSIS — C7951 Secondary malignant neoplasm of bone: Secondary | ICD-10-CM | POA: Diagnosis present

## 2024-05-28 DIAGNOSIS — E872 Acidosis, unspecified: Secondary | ICD-10-CM | POA: Diagnosis present

## 2024-05-28 DIAGNOSIS — E8809 Other disorders of plasma-protein metabolism, not elsewhere classified: Secondary | ICD-10-CM | POA: Diagnosis present

## 2024-05-28 DIAGNOSIS — I130 Hypertensive heart and chronic kidney disease with heart failure and stage 1 through stage 4 chronic kidney disease, or unspecified chronic kidney disease: Secondary | ICD-10-CM | POA: Diagnosis present

## 2024-05-28 DIAGNOSIS — R739 Hyperglycemia, unspecified: Secondary | ICD-10-CM | POA: Diagnosis present

## 2024-05-28 DIAGNOSIS — I5181 Takotsubo syndrome: Principal | ICD-10-CM | POA: Diagnosis present

## 2024-05-28 DIAGNOSIS — D638 Anemia in other chronic diseases classified elsewhere: Secondary | ICD-10-CM

## 2024-05-28 DIAGNOSIS — I5031 Acute diastolic (congestive) heart failure: Secondary | ICD-10-CM | POA: Diagnosis not present

## 2024-05-28 DIAGNOSIS — C61 Malignant neoplasm of prostate: Secondary | ICD-10-CM | POA: Diagnosis present

## 2024-05-28 DIAGNOSIS — I5021 Acute systolic (congestive) heart failure: Secondary | ICD-10-CM | POA: Diagnosis not present

## 2024-05-28 DIAGNOSIS — Z1152 Encounter for screening for COVID-19: Secondary | ICD-10-CM

## 2024-05-28 DIAGNOSIS — I2489 Other forms of acute ischemic heart disease: Secondary | ICD-10-CM | POA: Diagnosis present

## 2024-05-28 DIAGNOSIS — J9601 Acute respiratory failure with hypoxia: Principal | ICD-10-CM | POA: Diagnosis present

## 2024-05-28 DIAGNOSIS — R0602 Shortness of breath: Secondary | ICD-10-CM | POA: Diagnosis present

## 2024-05-28 DIAGNOSIS — R7989 Other specified abnormal findings of blood chemistry: Secondary | ICD-10-CM | POA: Diagnosis not present

## 2024-05-28 DIAGNOSIS — Z59868 Other specified financial insecurity: Secondary | ICD-10-CM | POA: Diagnosis not present

## 2024-05-28 DIAGNOSIS — D630 Anemia in neoplastic disease: Secondary | ICD-10-CM | POA: Diagnosis present

## 2024-05-28 DIAGNOSIS — D72829 Elevated white blood cell count, unspecified: Secondary | ICD-10-CM | POA: Diagnosis present

## 2024-05-28 DIAGNOSIS — D649 Anemia, unspecified: Secondary | ICD-10-CM | POA: Diagnosis present

## 2024-05-28 DIAGNOSIS — D696 Thrombocytopenia, unspecified: Secondary | ICD-10-CM | POA: Diagnosis present

## 2024-05-28 DIAGNOSIS — I480 Paroxysmal atrial fibrillation: Secondary | ICD-10-CM | POA: Diagnosis present

## 2024-05-28 DIAGNOSIS — N1832 Chronic kidney disease, stage 3b: Secondary | ICD-10-CM | POA: Diagnosis present

## 2024-05-28 DIAGNOSIS — Z79899 Other long term (current) drug therapy: Secondary | ICD-10-CM | POA: Diagnosis not present

## 2024-05-28 DIAGNOSIS — I5042 Chronic combined systolic (congestive) and diastolic (congestive) heart failure: Secondary | ICD-10-CM | POA: Diagnosis present

## 2024-05-28 DIAGNOSIS — E785 Hyperlipidemia, unspecified: Secondary | ICD-10-CM | POA: Diagnosis present

## 2024-05-28 DIAGNOSIS — J810 Acute pulmonary edema: Secondary | ICD-10-CM | POA: Diagnosis present

## 2024-05-28 DIAGNOSIS — E875 Hyperkalemia: Secondary | ICD-10-CM | POA: Diagnosis present

## 2024-05-28 HISTORY — DX: Malignant neoplasm of prostate: C61

## 2024-05-28 HISTORY — DX: Chronic systolic (congestive) heart failure: I50.22

## 2024-05-28 LAB — COMPREHENSIVE METABOLIC PANEL WITH GFR
ALT: 12 U/L (ref 0–44)
ALT: 10 U/L (ref 0–44)
AST: 25 U/L (ref 15–41)
AST: 29 U/L (ref 15–41)
Albumin: 2.8 g/dL — ABNORMAL LOW (ref 3.5–5.0)
Albumin: 2.7 g/dL — ABNORMAL LOW (ref 3.5–5.0)
Alkaline Phosphatase: 3626 U/L — ABNORMAL HIGH (ref 38–126)
Alkaline Phosphatase: 3174 U/L — ABNORMAL HIGH (ref 38–126)
Anion gap: 10 (ref 5–15)
Anion gap: 10 (ref 5–15)
BUN: 18 mg/dL (ref 8–23)
BUN: 20 mg/dL (ref 8–23)
CO2: 22 mmol/L (ref 22–32)
CO2: 21 mmol/L — ABNORMAL LOW (ref 22–32)
Calcium: 8.2 mg/dL — ABNORMAL LOW (ref 8.9–10.3)
Calcium: 8.2 mg/dL — ABNORMAL LOW (ref 8.9–10.3)
Chloride: 104 mmol/L (ref 98–111)
Chloride: 105 mmol/L (ref 98–111)
Creatinine, Ser: 1.8 mg/dL — ABNORMAL HIGH (ref 0.61–1.24)
Creatinine, Ser: 1.81 mg/dL — ABNORMAL HIGH (ref 0.61–1.24)
GFR, Estimated: 38 mL/min — ABNORMAL LOW (ref >60)
GFR, Estimated: 38 mL/min — ABNORMAL LOW (ref >60)
Glucose, Bld: 268 mg/dL — ABNORMAL HIGH (ref 70–99)
Glucose, Bld: 132 mg/dL — ABNORMAL HIGH (ref 70–99)
Potassium: 4.1 mmol/L (ref 3.5–5.1)
Potassium: 5.4 mmol/L — ABNORMAL HIGH (ref 3.5–5.1)
Sodium: 136 mmol/L (ref 135–145)
Sodium: 136 mmol/L (ref 135–145)
Total Bilirubin: 0.8 mg/dL (ref 0.0–1.2)
Total Bilirubin: 1 mg/dL (ref 0.0–1.2)
Total Protein: 6.1 g/dL — ABNORMAL LOW (ref 6.5–8.1)
Total Protein: 5.8 g/dL — ABNORMAL LOW (ref 6.5–8.1)

## 2024-05-28 LAB — CBC WITH DIFFERENTIAL/PLATELET
Abs Immature Granulocytes: 1.05 K/uL — ABNORMAL HIGH (ref 0.00–0.07)
Abs Immature Granulocytes: 0.35 K/uL — ABNORMAL HIGH (ref 0.00–0.07)
Basophils Absolute: 0.1 K/uL (ref 0.0–0.1)
Basophils Absolute: 0 K/uL (ref 0.0–0.1)
Basophils Relative: 1 %
Basophils Relative: 1 %
Eosinophils Absolute: 0.6 K/uL — ABNORMAL HIGH (ref 0.0–0.5)
Eosinophils Absolute: 0.1 K/uL (ref 0.0–0.5)
Eosinophils Relative: 4 %
Eosinophils Relative: 1 %
HCT: 28.4 % — ABNORMAL LOW (ref 39.0–52.0)
HCT: 25.8 % — ABNORMAL LOW (ref 39.0–52.0)
Hemoglobin: 8.7 g/dL — ABNORMAL LOW (ref 13.0–17.0)
Hemoglobin: 8.3 g/dL — ABNORMAL LOW (ref 13.0–17.0)
Immature Granulocytes: 7 %
Immature Granulocytes: 5 %
Lymphocytes Relative: 49 %
Lymphocytes Relative: 16 %
Lymphs Abs: 7.8 K/uL — ABNORMAL HIGH (ref 0.7–4.0)
Lymphs Abs: 1.2 K/uL (ref 0.7–4.0)
MCH: 30.6 pg (ref 26.0–34.0)
MCH: 31.2 pg (ref 26.0–34.0)
MCHC: 30.6 g/dL (ref 30.0–36.0)
MCHC: 32.2 g/dL (ref 30.0–36.0)
MCV: 100 fL (ref 80.0–100.0)
MCV: 97 fL (ref 80.0–100.0)
Monocytes Absolute: 0.8 K/uL (ref 0.1–1.0)
Monocytes Absolute: 0.7 K/uL (ref 0.1–1.0)
Monocytes Relative: 5 %
Monocytes Relative: 9 %
Neutro Abs: 5.4 K/uL (ref 1.7–7.7)
Neutro Abs: 5.3 K/uL (ref 1.7–7.7)
Neutrophils Relative %: 34 %
Neutrophils Relative %: 68 %
Platelets: 182 K/uL (ref 150–400)
Platelets: 139 K/uL — ABNORMAL LOW (ref 150–400)
RBC: 2.84 MIL/uL — ABNORMAL LOW (ref 4.22–5.81)
RBC: 2.66 MIL/uL — ABNORMAL LOW (ref 4.22–5.81)
RDW: 16.3 % — ABNORMAL HIGH (ref 11.5–15.5)
RDW: 16.7 % — ABNORMAL HIGH (ref 11.5–15.5)
Smear Review: NORMAL
WBC: 15.7 K/uL — ABNORMAL HIGH (ref 4.0–10.5)
WBC: 7.6 K/uL (ref 4.0–10.5)
nRBC: 1.1 % — ABNORMAL HIGH (ref 0.0–0.2)
nRBC: 1.3 % — ABNORMAL HIGH (ref 0.0–0.2)

## 2024-05-28 LAB — ECHOCARDIOGRAM COMPLETE
AR max vel: 2.45 cm2
AV Area VTI: 2.86 cm2
AV Area mean vel: 2.62 cm2
AV Mean grad: 3 mmHg
AV Peak grad: 6 mmHg
Ao pk vel: 1.22 m/s
Area-P 1/2: 3.23 cm2
Calc EF: 34.9 %
Height: 69 in
MV M vel: 2.84 m/s
MV Peak grad: 32.3 mmHg
MV VTI: 2.5 cm2
S' Lateral: 4.4 cm
Single Plane A2C EF: 29.6 %
Single Plane A4C EF: 37.6 %
Weight: 2400 [oz_av]

## 2024-05-28 LAB — FERRITIN: Ferritin: 3449 ng/mL — ABNORMAL HIGH (ref 24–336)

## 2024-05-28 LAB — I-STAT CHEM 8, ED
BUN: 19 mg/dL (ref 8–23)
Calcium, Ion: 1.1 mmol/L — ABNORMAL LOW (ref 1.15–1.40)
Chloride: 106 mmol/L (ref 98–111)
Creatinine, Ser: 1.8 mg/dL — ABNORMAL HIGH (ref 0.61–1.24)
Glucose, Bld: 262 mg/dL — ABNORMAL HIGH (ref 70–99)
HCT: 26 % — ABNORMAL LOW (ref 39.0–52.0)
Hemoglobin: 8.8 g/dL — ABNORMAL LOW (ref 13.0–17.0)
Potassium: 4.2 mmol/L (ref 3.5–5.1)
Sodium: 139 mmol/L (ref 135–145)
TCO2: 22 mmol/L (ref 22–32)

## 2024-05-28 LAB — TROPONIN I (HIGH SENSITIVITY)
Troponin I (High Sensitivity): 105 ng/L (ref <18)
Troponin I (High Sensitivity): 484 ng/L (ref <18)
Troponin I (High Sensitivity): 760 ng/L (ref <18)
Troponin I (High Sensitivity): 900 ng/L (ref <18)
Troponin I (High Sensitivity): 924 ng/L (ref <18)
Troponin I (High Sensitivity): 919 ng/L (ref <18)
Troponin I (High Sensitivity): 868 ng/L (ref <18)

## 2024-05-28 LAB — URINALYSIS, COMPLETE (UACMP) WITH MICROSCOPIC
Bacteria, UA: NONE SEEN
Bilirubin Urine: NEGATIVE
Glucose, UA: NEGATIVE mg/dL
Ketones, ur: NEGATIVE mg/dL
Leukocytes,Ua: NEGATIVE
Nitrite: NEGATIVE
Protein, ur: 100 mg/dL — AB
Specific Gravity, Urine: 1.008 (ref 1.005–1.030)
pH: 6 (ref 5.0–8.0)

## 2024-05-28 LAB — CBG MONITORING, ED
Glucose-Capillary: 122 mg/dL — ABNORMAL HIGH (ref 70–99)
Glucose-Capillary: 94 mg/dL (ref 70–99)
Glucose-Capillary: 82 mg/dL (ref 70–99)

## 2024-05-28 LAB — IRON AND TIBC
Iron: 124 ug/dL (ref 45–182)
Saturation Ratios: 64 % — ABNORMAL HIGH (ref 17.9–39.5)
TIBC: 193 ug/dL — ABNORMAL LOW (ref 250–450)
UIBC: 69 ug/dL

## 2024-05-28 LAB — PROTIME-INR
INR: 1.3 — ABNORMAL HIGH (ref 0.8–1.2)
Prothrombin Time: 17.2 s — ABNORMAL HIGH (ref 11.4–15.2)

## 2024-05-28 LAB — HEPARIN LEVEL (UNFRACTIONATED): Heparin Unfractionated: 0.14 [IU]/mL — ABNORMAL LOW (ref 0.30–0.70)

## 2024-05-28 LAB — BRAIN NATRIURETIC PEPTIDE: B Natriuretic Peptide: 1135.3 pg/mL — ABNORMAL HIGH (ref 0.0–100.0)

## 2024-05-28 LAB — RESP PANEL BY RT-PCR (RSV, FLU A&B, COVID)  RVPGX2
Influenza A by PCR: NEGATIVE
Influenza B by PCR: NEGATIVE
Resp Syncytial Virus by PCR: NEGATIVE
SARS Coronavirus 2 by RT PCR: NEGATIVE

## 2024-05-28 LAB — GAMMA GT: GGT: 16 U/L (ref 7–50)

## 2024-05-28 LAB — PATHOLOGIST SMEAR REVIEW

## 2024-05-28 LAB — I-STAT CG4 LACTIC ACID, ED: Lactic Acid, Venous: 1.5 mmol/L (ref 0.5–1.9)

## 2024-05-28 LAB — HEMOGLOBIN A1C
Hgb A1c MFr Bld: 5.6 % (ref 4.8–5.6)
Mean Plasma Glucose: 114.02 mg/dL

## 2024-05-28 LAB — MAGNESIUM
Magnesium: 2.1 mg/dL (ref 1.7–2.4)
Magnesium: 2 mg/dL (ref 1.7–2.4)

## 2024-05-28 LAB — PHOSPHORUS: Phosphorus: 3.4 mg/dL (ref 2.5–4.6)

## 2024-05-28 LAB — ABO/RH: ABO/RH(D): A POS

## 2024-05-28 LAB — TYPE AND SCREEN
ABO/RH(D): A POS
Antibody Screen: NEGATIVE

## 2024-05-28 LAB — HIV ANTIBODY (ROUTINE TESTING W REFLEX): HIV Screen 4th Generation wRfx: NONREACTIVE

## 2024-05-28 LAB — POTASSIUM: Potassium: 4.6 mmol/L (ref 3.5–5.1)

## 2024-05-28 LAB — PROCALCITONIN: Procalcitonin: 3.3 ng/mL

## 2024-05-28 LAB — APTT: aPTT: 34 s (ref 24–36)

## 2024-05-28 MED ORDER — ACETAMINOPHEN 650 MG RE SUPP: 650.0000 mg | Freq: Four times a day (QID) | RECTAL | Status: DC | PRN

## 2024-05-28 MED ORDER — AZITHROMYCIN 250 MG PO TABS
500.0000 mg | ORAL_TABLET | Freq: Every day | ORAL | Status: DC
  Administered 2024-05-28: 500 mg via ORAL
  Filled 2024-05-28: qty 2

## 2024-05-28 MED ORDER — ASPIRIN 81 MG PO TBEC
81.0000 mg | DELAYED_RELEASE_TABLET | Freq: Every day | ORAL | Status: DC
  Administered 2024-05-28: 81 mg via ORAL
  Filled 2024-05-28: qty 1

## 2024-05-28 MED ORDER — PERFLUTREN LIPID MICROSPHERE
1.0000 mL | INTRAVENOUS | Status: AC | PRN
  Administered 2024-05-28: 4 mL via INTRAVENOUS

## 2024-05-28 MED ORDER — ATORVASTATIN CALCIUM 40 MG PO TABS
40.0000 mg | ORAL_TABLET | Freq: Every day | ORAL | Status: DC
  Administered 2024-05-28 – 2024-05-30 (×2): 40 mg via ORAL
  Filled 2024-05-28 (×3): qty 1

## 2024-05-28 MED ORDER — ISOSORBIDE MONONITRATE ER 30 MG PO TB24
15.0000 mg | ORAL_TABLET | Freq: Every day | ORAL | Status: DC
  Administered 2024-05-28: 15 mg via ORAL
  Filled 2024-05-28: qty 1

## 2024-05-28 MED ORDER — ONDANSETRON HCL 4 MG/2ML IJ SOLN
4.0000 mg | Freq: Four times a day (QID) | INTRAMUSCULAR | Status: DC | PRN
  Filled 2024-05-28: qty 2

## 2024-05-28 MED ORDER — INSULIN ASPART 100 UNIT/ML IJ SOLN: 0.0000 [IU] | Freq: Three times a day (TID) | INTRAMUSCULAR | Status: DC

## 2024-05-28 MED ORDER — NITROGLYCERIN IN D5W 200-5 MCG/ML-% IV SOLN
0.0000 ug/min | INTRAVENOUS | Status: DC
  Administered 2024-05-28: 5 ug/min via INTRAVENOUS
  Filled 2024-05-28: qty 250

## 2024-05-28 MED ORDER — FUROSEMIDE 10 MG/ML IJ SOLN
60.0000 mg | Freq: Two times a day (BID) | INTRAMUSCULAR | Status: DC
  Administered 2024-05-28 – 2024-05-29 (×3): 60 mg via INTRAVENOUS
  Filled 2024-05-28 (×3): qty 6

## 2024-05-28 MED ORDER — ACETAMINOPHEN 325 MG PO TABS: 650.0000 mg | ORAL_TABLET | Freq: Four times a day (QID) | ORAL | Status: DC | PRN

## 2024-05-28 MED ORDER — FUROSEMIDE 10 MG/ML IJ SOLN
40.0000 mg | Freq: Once | INTRAMUSCULAR | Status: AC
  Administered 2024-05-28: 40 mg via INTRAVENOUS
  Filled 2024-05-28: qty 4

## 2024-05-28 MED ORDER — HEPARIN (PORCINE) 25000 UT/250ML-% IV SOLN
1050.0000 [IU]/h | INTRAVENOUS | Status: DC
  Administered 2024-05-28: 850 [IU]/h via INTRAVENOUS
  Administered 2024-05-29: 1050 [IU]/h via INTRAVENOUS
  Filled 2024-05-28 (×2): qty 250

## 2024-05-28 MED ORDER — HEPARIN BOLUS VIA INFUSION
4000.0000 [IU] | Freq: Once | INTRAVENOUS | Status: AC
  Administered 2024-05-28: 4000 [IU] via INTRAVENOUS
  Filled 2024-05-28: qty 4000

## 2024-05-28 MED ORDER — MELATONIN 3 MG PO TABS: 3.0000 mg | ORAL_TABLET | Freq: Every evening | ORAL | Status: DC | PRN

## 2024-05-28 MED ORDER — SODIUM CHLORIDE 0.9 % IV SOLN
2.0000 g | INTRAVENOUS | Status: DC
  Administered 2024-05-28: 2 g via INTRAVENOUS
  Filled 2024-05-28: qty 20

## 2024-05-28 NOTE — H&P (Signed)
 History and Physical      Joseph Mayo FMW:[id number] DOB: May 11, 1946 DOA: 05/28/2024; DOS: 05/28/2024  PCP: [facility]  Patient coming from: home   I have personally briefly reviewed patient's old medical records in [facility]  Chief Complaint: sob  HPI: Joseph Mayo is a 78 y.o. 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   Following history of stroke evaluation as well as my discussions with the patient's daughter, who is present at bedside, in addition to my discussions with the EDP in the chart review.  Patient reintubated this evening at baseline level of health, asymptomatic, for waking up from sleep feeling acutely short of breath.  EMS was contacted and the patient was found to be hypoxic with initial oxygen saturations in the mid 50s to 60s on room air, subsequently prompting initiation of nonrebreather, which was weaned to 6 L nasal cannula upon which oxygen saturations improved to 100%.  Patient subsidy brought to the [facility] emergency department further evaluation and management thereof.  No known baseline supplemental oxygen requirements.  He has a history of chronic systolic/diastolic heart failure with most recent echocardiogram in November 2021 showing LVEF 45 to 50%, grade 1 diastolic dysfunction, normal right ventricular systolic function, moderately dilated left atrium and mild mitral digitation.  Not on any diuretic medications as an outpatient.  He is a lifelong non-smoker, and denies any known history of underlying chronic pulmonary pathology, including no history of COPD or  asthma.  Denies any associated chest pain with the above.  No recent subjective fever, chills, rigors, or generalized myalgias.  Recently had prolonged hospitalization at [facility] in the setting of acute hip fracture, complicated by AKI, thrombocytopenia.   Medical history also notable for metastatic prostate cancer for which he recently received Lupron.    ED Course:  Vital signs in the ED were notable for the following: Afebrile; heart rates initially in the 1 teens, [place] decreasing into the 70s following initiation of BiPAP, IV diuresis efforts and nitroglycerin drip; blood pressures initially in the 140s 150s, subsequently decreasing in the 90s to low 100s; respiratory rate mid to high 20s, says improving in the range of 15-16.  Patient [facility] to [facility] ED on 6 L nonrebreather, upon which oxygen saturation was noted to be 100%, subsequently transition to BiPAP, with ensuing impression saturations remaining in the high 90s to 100%.  Labs were notable for the following: CMP notable for the following: Sodium 136, potassium 4.1, bicarb 22, creatinine 1.80 compared to 1.6 on 05/07/2024, glucose 260.  He also does have a mild anemia noted be 9.2, avidin 2.8.  Alkaline phosphatase 3600, otherwise liver enzymes are within normal limits.  BNP 1135 compared to 310 in November 2021.  High sensitive troponin I was initially 105, repeat value pending.  CBC notable for white cell count 15,700 with 49% leukocytes, hemoglobin 8.7 associated Neuraceq/Norocarp properties and relative demonstration prior hemoglobin data point of 9.9 on 05/07/2024.  COVID, influenza, RSV PCR all negative.  Per my interpretation, EKG in ED demonstrated the following: Sinus rhythm with heart rate 88, normal intervals, nonspecific T wave inversion in aVL and V2, will demonstrate no  evidence of ST changes, including no evidence of ST elevation.  Imaging in the ED, per corresponding formal radiology read, was notable for  the following: 1 view chest x-ray showed diffuse bilateral patchy airspace interstitial opacities, consistent with CHF, demonstrate no evidence of infiltrate, pleural effusion, or pneumothorax.  EDP d/w on-call cardiology fellow, who conveyed that cardiology will formally consult, with initial recs for further trop trending and echocardiogram.   While in the ED, the following were administered: Lasix 40 mg IV x 1 dose, nitroglycerin drip 9 titrated and sent to 5 mcg/min.  Subsequently, the patient was admitted for further evaluation management of acute hypoxic respiratory failure in the setting of suspected flash pulmonary edema in the context of acute on chronic systolic/diastolic heart failure.    Review of Systems: As per HPI otherwise 10 point review of systems negative.   Past Medical History:  Diagnosis Date   Acute on chronic respiratory failure with hypoxia (HCC)    Autonomic dysfunction    Chronic systolic heart failure (HCC)    Guillain Barr syndrome    2021   Paroxysmal atrial fibrillation (HCC)    Prostate cancer metastatic to multiple sites [facility])    Severe sepsis (HCC)     History reviewed. No pertinent surgical history.  Social History:  reports that he has never smoked. He has never used smokeless tobacco. He reports that he does not drink alcohol and does not use drugs.   No Known Allergies  History reviewed. No pertinent family history.   At this time, does not appear to be a prescriptoin over-the-counter prn or scheduled medications. Objective    Physical Exam: Vitals:   05/28/24 0130 05/28/24 0145 05/28/24 0149 05/28/24 0200  BP: 107/72 107/70  107/71  Pulse: 93 88  87  Resp: (!) 27 (!) 25  (!) 21  Temp:      TempSrc:   Oral   SpO2: 100% 99%  99%  Weight:      Height:        General: appears to be stated age; compfortable bipap Skin: warm, dry, no rash Head:  AT/[place] Mouth:  Oral mucosa membranes appear moist, normal dentition Neck: supple;  trachea midline Heart:  RRR; did not appreciate any M/R/G Lungs: CTAB, did not appreciate any wheezes, rales, or rhonchi Abdomen: + BS; soft, ND, NT Vascular: 2+ pedal pulses b/l; 2+ radial pulses b/l Extremities: no peripheral edema, no muscle wasting    Labs on Admission: I have personally reviewed following labs and imaging studies  CBC: Recent Labs  Lab 05/28/24 0049 05/28/24 0051  WBC 15.7*  --   NEUTROABS 5.4  --   HGB 8.7* 8.8*  HCT 28.4* 26.0*  MCV 100.0  --   PLT 182  --    Basic Metabolic Panel: Recent Labs  Lab 05/28/24 0049 05/28/24 0051  NA 136 139  K 4.1 4.2  CL 104 106  CO2 22  --   GLUCOSE 268* 262*  BUN 18 19  CREATININE 1.80* 1.80*  CALCIUM 8.2*  --    GFR: Estimated Creatinine Clearance: 32.5 mL/min (A) (by C-G formula based on SCr of 1.8 mg/dL (H)). Liver Function Tests: Recent Labs  Lab 05/28/24 0049  AST 25  ALT 12  ALKPHOS 3,626*  BILITOT 0.8  PROT 6.1*  ALBUMIN 2.8*   No results for input(s): LIPASE, AMYLASE in the last 168 hours. No results for input(s): AMMONIA in the last 168 hours. Coagulation Profile: No results for input(s): INR,  PROTIME in the last 168 hours. Cardiac Enzymes: No results for input(s): CKTOTAL, CKMB, CKMBINDEX, TROPONINI in the last 168 hours. BNP (last 3 results) No results for input(s): PROBNP in the last 8760 hours. HbA1C: No results for input(s): HGBA1C in the last 72 hours. CBG: No results for input(s): GLUCAP in the last 168 hours. Lipid Profile: No results for input(s): CHOL, HDL, LDLCALC, TRIG, CHOLHDL, LDLDIRECT in the last 72 hours. Thyroid Function Tests: No results for input(s): TSH, T4TOTAL, FREET4, T3FREE, THYROIDAB in the last 72 hours. Anemia Panel: No results for input(s): VITAMINB12, FOLATE, FERRITIN, TIBC, IRON, RETICCTPCT in the last 72 hours. Urine analysis:    Component Value Date/Time   COLORURINE YELLOW 07/19/2020 1545    APPEARANCEUR HAZY (A) 07/19/2020 1545   LABSPEC 1.016 07/19/2020 1545   PHURINE 5.0 07/19/2020 1545   GLUCOSEU NEGATIVE 07/19/2020 1545   HGBUR MODERATE (A) 07/19/2020 1545   BILIRUBINUR NEGATIVE 07/19/2020 1545   KETONESUR NEGATIVE 07/19/2020 1545   PROTEINUR 100 (A) 07/19/2020 1545   NITRITE NEGATIVE 07/19/2020 1545   LEUKOCYTESUR NEGATIVE 07/19/2020 1545    Radiological Exams on Admission: DG Chest Portable 1 View Result Date: 05/28/2024 CLINICAL DATA:  Hypoxia., . Pt states that he woke up and started having issues breathing. EXAM: PORTABLE CHEST 1 VIEW COMPARISON:  Chest x-ray 07/23/2020 FINDINGS: The heart and mediastinal contours are within normal limits. Diffuse patchy airspace and interstitial opacities. No pleural effusion. No pneumothorax. No acute osseous abnormality. IMPRESSION: Diffuse patchy airspace and interstitial opacities. Electronically Signed   By: Morgane  Naveau M.D.   On: 05/28/2024 01:24      Assessment/Plan   Principal Problem:   Flash pulmonary edema (HCC) Active Problems:   Acute hypoxic respiratory failure (HCC)   SOB (shortness of breath)   Acute on chronic combined systolic and diastolic CHF (congestive heart failure) (HCC)   Elevated troponin   Alkaline phosphatase elevation   Hyperglycemia   Leukocytosis   CKD stage 3b, GFR 30-44 ml/min (HCC)   Anemia of chronic disease     #) Acute hypoxic respiratory failure due to flash pulmonary edema in the setting of acute on chronic systolic heart failure: Sudden onset of shortness of breath, increased work of breathing, with chest x-ray showing evidence of patchy bilateral airspace opacities consistent with pulmonary edema, with fourfold increase in BNP relative to most recent prior, with initial oxygen saturation reported to be in the 50s to 60s on room air, subsequent improving into the high 90s 100% on 6 L nonrebreather followed by initiation of BiPAP, upon which the patient appears much more  comfortable.  Will pursue further diagnostic evaluation to determine source of patient's facial edema/acute on chronic systolic/diastolic heart failure as below, will noting that most recent echocardiogram occurred in November 2021, with LVEF 45 to 50% as well as grade 1 diastolic dysfunction.  As component of this evaluation, we will pursue updated echocardiogram.  He received a dose of 40 mg of IV Lasix in the ED this evening.  Suspect that initial high sensitive troponin I elevation represents a type II supply/demand mismatch as consequence of diminished oxygen capacity 70 from hypoxic respiratory failure as well as from acute State heart failure itself, as opposed to representing a type I process due to acute plaque rupture, with ACS, will remaining in the differential, currently.  Less likely, in the absence of any recent chest pain, EKG shows no evidence of acute ischemic changes, including no evidence of STEMI.  EDP discussed patient's case  with on-call cardiology fellow, Dr. Marty, who conveys that cardiology will formally consult, with initial recommendations for further trending of troponin, echocardiogram, with additional cardiology recommendations, including any application for heparin drip, to follow.  Plan: Continue BiPAP.  Monitor strict I's and O's and daily weights.  Close monitoring sitting blood pressures.  Monitor on telemetry.  Check updated echocardiogram morning.  Trend troponin.  Cardiology to formally consult, as above.  On procalcitonin level, add on magnesium and phosphorus levels.                  # (Elevated alkaline phosphatase level: Noted to be elevated at 3600 per CMP this evening, without any elevation to bilirubin.  Appears not cholestatic in nature, rather suspect that elevation hypophosphatasia is on the basis of patient's known metastatic disease to bone without excessive underlying prostate cancer.  Will check GGT to further correlate.  Plan: GGT.  CMP  in the morning.,                     #) Hyperglycemia: Presenting blood sugar noted to be 6, in the absence of a documented history of underlying diabetes.  Evidence of anion gap metabolic acidosis.  Differential includes interval development of diabetes relative to most recent prior hemoglobin A1c from 6 years ago versus hypoglycemia consequence of physiologic stress stemming from his presenting/pulmonary edema.   Plan:-Add on hemoglobin A1c level.  Accu-Cheks q. ACHS with low-dose sliding scale insulin.                  #) Leukocytosis: Mildly elevated blood cell count 50,700 with lymphocytic predominance.  COVID, influenza, RSV PCR are all negative.  The absence of any evidence of underlying infectious source at this time, criteria for sepsis are not currently met.  Chest x-ray shows no evidence of enlargement to suggest pneumonia.  Will add on procalcitonin level, and also check urinalysis.  Differential includes reactive contribution towards leukocytosis in the setting of presenting/pulmonary edema, as above.  Plan: Add on procalcitonin level.  Check urinalysis.  CBC in the morning.  Further evaluation management of presenting/pulmonary edema, as above.                    #) CKD Stage 3B: Documented history of such, with baseline creatinine 1.7-2.0, with presenting creatinine consistent with this baseline, while noting most recent prior creatinine data point to be 1.86 on 05/07/2024.    Plan: Monitor strict I's and O's and daily weights.  Attempt to avoid nephrotoxic agents.  CMP/magnesium level in the AM.                    #) Anemia of chronic disease: Documented history of such, a/w with baseline hgb range 7-10, with presenting hgb consistent with this range, in the absence of any overt evidence of active bleed.  Most recent prior hemoglobin data point was noted to be 9.9 on 05/07/2024.  Suspect that interval decrease in  hemoglobin from 9.9-8.7 is hemodilutional in the context of presenting flash-filling edema as a result of acute on chronic systolic/diastolic heart failure, will also pursue some additional diagnostic evaluation and further hemoglobin trending as outlined below.   Plan: Repeat CBC in the morning.  Add on iron studies, PTT, INR.  Check type and screen.       DVT prophylaxis: SCD's   Code Status: Full code Family Communication: I discussed patient's case with his daughter, who is present bedside Disposition  Plan: Per Rounding Team Consults called:  EDP d/w on-call cardiology fellow, who conveyed that cardiology will formally consult, with initial recs for further trop trending and echocardiogram. ;  Admission status: Inpatient     I SPENT GREATER THAN 75  MINUTES IN CLINICAL CARE TIME/MEDICAL DECISION-MAKING IN COMPLETING THIS ADMISSION.      Khai Torbert B Shamond Skelton DO [facility]  From 7PM - 7AM   05/28/2024, 3:01 AM

## 2024-05-28 NOTE — ED Provider Notes (Signed)
 [place] EMERGENCY DEPARTMENT AT [facility] Provider Note   CSN: [id number] Arrival date & time: 05/28/24  0010     Patient presents with: Shortness of Breath   Joseph Mayo is a 78 y.o. male.   HPI     This is a 78 year old male who presents with acute onset shortness of breath.  History of GBS, metastatic prostate cancer.  Per EMS he called out for shortness of breath.  Onset 1 hour prior to arrival.  They noted him to be 50s on room air.  He was placed on a nonrebreather and then titrated back to 6 L to maintain oxygenation.  No known history of asthma or COPD.  Denies any heart history but prior echo in his chart from 2021 does indicate an EF of 45 to 50%.  Daughter indicates that he does have some mild lower extremity swelling at times.  He denies any chest pain.  No recent illnesses or fevers.  Daughter indicates recent complicated history including metastatic prostate cancer, thrombocytopenia, renal dysfunction.  He got Lupron but is not currently undergoing any active cancer treatment.  Prior to Admission medications   Not on File    Allergies: Patient has no known allergies.    Review of Systems  Constitutional:  Negative for fever.  Respiratory:  Positive for shortness of breath and wheezing. Negative for cough.   Cardiovascular:  Positive for leg swelling. Negative for chest pain.  All other systems reviewed and are negative.   Updated Vital Signs BP 107/71   Pulse 87   Temp 98 F (36.7 C) (Oral)   Resp (!) 21   Ht 1.753 m (5' 9)   Wt 68 kg   SpO2 99%   BMI 22.15 kg/m   Physical Exam Vitals and nursing note reviewed.  Constitutional:      Appearance: He is well-developed. He is ill-appearing and toxic-appearing.  HENT:     Head: Normocephalic and atraumatic.  Eyes:     Pupils: Pupils are equal, round, and reactive to light.  Cardiovascular:     Rate and Rhythm: Regular rhythm. Tachycardia present.     Heart sounds: Normal heart  sounds. No murmur heard. Pulmonary:     Effort: Pulmonary effort is normal. No respiratory distress.     Breath sounds: Normal breath sounds. No wheezing.     Comments: Speaking in short sentences, tachypneic, accessory muscle use noted, crackles bilateral lower lobes with rhonchorous breath sounds Abdominal:     General: Bowel sounds are normal.     Palpations: Abdomen is soft.     Tenderness: There is no abdominal tenderness. There is no rebound.  Musculoskeletal:     Cervical back: Neck supple.     Right lower leg: Edema present.     Left lower leg: Edema present.  Lymphadenopathy:     Cervical: No cervical adenopathy.  Skin:    General: Skin is warm and dry.  Neurological:     Mental Status: He is alert and oriented to person, place, and time.  Psychiatric:        Mood and Affect: Mood normal.     (all labs ordered are listed, but only abnormal results are displayed) Labs Reviewed  CBC WITH DIFFERENTIAL/PLATELET - Abnormal; Notable for the following components:      Result Value   WBC 15.7 (*)    RBC 2.84 (*)    Hemoglobin 8.7 (*)    HCT 28.4 (*)    RDW 16.3 (*)  nRBC 1.1 (*)    Lymphs Abs 7.8 (*)    Eosinophils Absolute 0.6 (*)    Abs Immature Granulocytes 1.05 (*)    All other components within normal limits  BRAIN NATRIURETIC PEPTIDE - Abnormal; Notable for the following components:   B Natriuretic Peptide 1,135.3 (*)    All other components within normal limits  COMPREHENSIVE METABOLIC PANEL WITH GFR - Abnormal; Notable for the following components:   Glucose, Bld 268 (*)    Creatinine, Ser 1.80 (*)    Calcium 8.2 (*)    Total Protein 6.1 (*)    Albumin 2.8 (*)    Alkaline Phosphatase 3,626 (*)    GFR, Estimated 38 (*)    All other components within normal limits  I-STAT CHEM 8, ED - Abnormal; Notable for the following components:   Creatinine, Ser 1.80 (*)    Glucose, Bld 262 (*)    Calcium, Ion 1.10 (*)    Hemoglobin 8.8 (*)    HCT 26.0 (*)    All  other components within normal limits  TROPONIN I (HIGH SENSITIVITY) - Abnormal; Notable for the following components:   Troponin I (High Sensitivity) 105 (*)    All other components within normal limits  RESP PANEL BY RT-PCR (RSV, FLU A&B, COVID)  RVPGX2  PATHOLOGIST SMEAR REVIEW  TROPONIN I (HIGH SENSITIVITY)    EKG: EKG Interpretation Date/Time:  Tuesday May 28 2024 00:27:34 EDT Ventricular Rate:  118 PR Interval:  168 QRS Duration:  95 QT Interval:  316 QTC Calculation: 443 R Axis:   14  Text Interpretation: Sinus tachycardia Anteroseptal infarct, age indeterminate Confirmed by Bari Pfeiffer ([id number]) on 05/28/2024 1:31:53 AM  Radiology: [facility] Chest Portable 1 View Result Date: 05/28/2024 CLINICAL DATA:  Hypoxia., . Pt states that he woke up and started having issues breathing. EXAM: PORTABLE CHEST 1 VIEW COMPARISON:  Chest x-ray 07/23/2020 FINDINGS: The heart and mediastinal contours are within normal limits. Diffuse patchy airspace and interstitial opacities. No pleural effusion. No pneumothorax. No acute osseous abnormality. IMPRESSION: Diffuse patchy airspace and interstitial opacities. Electronically Signed   By: Morgane  Naveau M.D.   On: 05/28/2024 01:24     .Critical Care  Performed by: Bari Pfeiffer FALCON, MD Authorized by: Bari Pfeiffer FALCON, MD   Critical care provider statement:    Critical care time (minutes):  60   Critical care was necessary to treat or prevent imminent or life-threatening deterioration of the following conditions:  Respiratory failure and cardiac failure   Critical care was time spent personally by me on the following activities:  Development of treatment plan with patient or surrogate, discussions with consultants, evaluation of patient's response to treatment, examination of patient, ordering and review of laboratory studies, ordering and review of radiographic studies, ordering and performing treatments and interventions, pulse oximetry,  re-evaluation of patient's condition and review of old charts    Medications Ordered in the ED  nitroGLYCERIN 50 mg in dextrose 5 % 250 mL (0.2 mg/mL) infusion (5 mcg/min Intravenous New Bag/Given 05/28/24 0058)  furosemide (LASIX) injection 40 mg (has no administration in time range)    Clinical Course as of 05/28/24 0210  Tue May 28, 2024  0119 Patient appears significantly improved on BiPAP.  Heart rate is improved.  Respiratory rate is improved.  Patient is resting comfortably. [CH]  0207 Spoke to Dr. Marty, cardiology.  Agrees with assessment.  Cardiology to evaluate. [CH]    Clinical Course User Index [CH] Belenda Alviar, Pfeiffer FALCON, MD  Medical Decision Making Amount and/or Complexity of Data Reviewed Labs: ordered. Radiology: ordered.  Risk Prescription drug management. Decision regarding hospitalization.   This patient presents to the ED for concern of shortness of breath, this involves an extensive number of treatment options, and is a complaint that carries with it a high risk of complications and morbidity.  I considered the following differential and admission for this acute, potentially life threatening condition.  The differential diagnosis includes ACS, CHF, PE, pneumothorax, pneumonia  MDM:    This is a 78 year old male with complicated past medical history including Guillain-Barr syndrome, metastatic prostate cancer, recent AKI and thrombocytopenia who presents with acute onset shortness of breath.  He is in respiratory distress upon arrival.  Tachypneic and tachycardic.  He was placed on BiPAP for work of breathing.  He had significant improvement with BiPAP and a nitro drip.  Clinical presentation was most consistent with flash pulmonary edema.  Has not had any recent fevers or cough to indicate infectious etiology and his symptoms quite suddenly.  He does have some lower extremity edema as well.  Labs obtained.  BNP greater than 1100.  Chest  x-ray shows bilateral opacities which I feel are consistent likely with pulmonary edema troponin slightly elevated at 105 but his EKG is reassuring and improved while on BiPAP.  Spoke with cardiology.  They will assess the patient.  Hemoglobin is 8.8.  Creatinine is 1.8.  Patient clinically stable and improved with 5 IV nitro and BiPAP.  He is not currently on any diuretic.  Will give 1 dose of 40 mg of IV Lasix.  Will plan for admission to the stepdown unit.  At this time he is full code.  Of note his alkaline phosphatase levels are significantly elevated.  This is likely reflective of known metastatic cancer.  Will defer further workup to the hospitalist if warranted.  (Labs, imaging, consults)  Labs: I Ordered, and personally interpreted labs.  The pertinent results include: CBC, CMP, BNP, troponin  Imaging Studies ordered: I ordered imaging studies including chest x-ray I independently visualized and interpreted imaging. I agree with the radiologist interpretation  Additional history obtained from daughter at bedside.  External records from outside source obtained and reviewed including prior hospitalizations discharge summaries  Cardiac Monitoring: The patient was maintained on a cardiac monitor.  If on the cardiac monitor, I personally viewed and interpreted the cardiac monitored which showed an underlying rhythm of: Sinus  Reevaluation: After the interventions noted above, I reevaluated the patient and found that they have :improved  Social Determinants of Health:  lives independently at  Disposition:  admit  Co morbidities that complicate the patient evaluation  Past Medical History:  Diagnosis Date   Acute on chronic respiratory failure with hypoxia (HCC)    Autonomic dysfunction    Guillain Barr syndrome    Paroxysmal atrial fibrillation (HCC)    Severe sepsis (HCC)      Medicines Meds ordered this encounter  Medications   nitroGLYCERIN 50 mg in dextrose 5 % 250 mL  (0.2 mg/mL) infusion   furosemide (LASIX) injection 40 mg    I have reviewed the patients home medicines and have made adjustments as needed  Problem List / ED Course: Problem List Items Addressed This Visit   None Visit Diagnoses       Acute respiratory failure with hypoxia (HCC)    -  Primary     Flash pulmonary edema (HCC)         Elevated  troponin                    Final diagnoses:  Acute respiratory failure with hypoxia (HCC)  Flash pulmonary edema (HCC)  Elevated troponin    ED Discharge Orders     None          Mita Vallo, Charmaine FALCON, MD 05/28/24 [phone]

## 2024-05-28 NOTE — ED Notes (Signed)
 CCMD called to report patient transfer to RM 6 and place the patient on cardiac monitoring services.
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
## 2024-05-28 NOTE — Progress Notes (Addendum)
 PHARMACY - ANTICOAGULATION CONSULT NOTE  Pharmacy Consult for heparin  Indication: chest pain/ACS / NSTEMI  No Known Allergies  Patient Measurements: Height: 5' 9 (175.3 cm) Weight: 68 kg (150 lb) IBW/kg (Calculated) : 70.7 HEPARIN DW (KG): 68  Vital Signs: Temp: 97.8 F (36.6 C) (10/07 0600) Temp Source: Oral (10/07 0600) BP: 96/70 (10/07 0800) Pulse Rate: 72 (10/07 0800)  Labs: Recent Labs    05/28/24 0049 05/28/24 0051 05/28/24 0339 05/28/24 0639 05/28/24 0757  HGB 8.7* 8.8*  --  8.3*  --   HCT 28.4* 26.0*  --  25.8*  --   PLT 182  --   --  139*  --   APTT  --   --   --  34  --   LABPROT  --   --   --  17.2*  --   INR  --   --   --  1.3*  --   CREATININE 1.80* 1.80*  --  1.81*  --   TROPONINIHS 105*  --  484*  --  760*    Estimated Creatinine Clearance: 32.4 mL/min (A) (by C-G formula based on SCr of 1.81 mg/dL (H)).   Medical History: Past Medical History:  Diagnosis Date   Acute on chronic respiratory failure with hypoxia (HCC)    Autonomic dysfunction    Chronic systolic heart failure (HCC)    Guillain Barr syndrome    2021   Paroxysmal atrial fibrillation (HCC)    Prostate cancer metastatic to multiple sites [facility])    Severe sepsis (HCC)     Medications:  (Not in a hospital admission)  Scheduled:   furosemide  60 mg Intravenous BID   insulin aspart  0-9 Units Subcutaneous TID WC   Infusions:   nitroGLYCERIN Stopped (05/28/24 0341)   PRN: acetaminophen **OR** acetaminophen, melatonin, ondansetron (ZOFRAN) IV  Assessment: Patient with history of cardiomyopathy (EF 45 to 50% 2021, most recently 55 to 60% 2025), Guillain-Barr syndrome, PE, ?PAF not on anticoagulation, hypertension, metastatic prostate cancer. Cardiology evaluated and consulted pharmacy for heparin infusion. Possible cardiac cath pending. Not on anticoagulation prior to admission.  Trop = 105, 484, 760. Hb 8.3, plt 139  Goal of Therapy:  Heparin level 0.3-0.7  units/ml Monitor platelets by anticoagulation protocol: Yes   Plan:  Give 4000 units bolus x 1 Start heparin infusion at 850 units/hr Check anti-Xa level in 8 hours and daily while on heparin Continue to monitor H&H and platelets  Joseph Mayo 05/28/2024,9:30 AM

## 2024-05-28 NOTE — Hospital Course (Addendum)
 78 y.o. 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   CMP notable for the following: Sodium 136, potassium 4.1, bicarb 22, creatinine 1.80 compared to 1.6 on 05/07/2024, glucose 260. He also does have a mild anemia noted be 9.2, avidin 2.8. Alkaline phosphatase 3600, otherwise liver enzymes are within normal limits. BNP 1135 compared to 310 in November 2021. High sensitive troponin I was initially 105, repeat value pending. CBC notable for white cell count 15,700 with 49% leukocytes, hemoglobin 8.7 associated Neuraceq/Norocarp properties and relative demonstration prior hemoglobin data point of 9.9 on 05/07/2024. COVID, influenza, RSV PCR all negative.

## 2024-05-28 NOTE — ED Triage Notes (Signed)
 BIB [facility] from home with c/o SOB x 1 hour PTA of EMS. Pt states that  he woke up and started having issues breathing. Pt has audible wheezing and requiring 6 LPM via [place], at pt baseline does not normally use O2.   BP 140/94 HR 115 Resp 26 Spo2 100% on 6 LPM via [place] 18 g RAC

## 2024-05-28 NOTE — ED Notes (Addendum)
 Dr. Gifford consulted regarding pt BP. Notified him that pt is still A&Ox4 and does not have any complaints or change in condition. Pt denies new cp, sob, etc. Instructed pt to notify me if he has any new or worsening symptoms. Will continue to monitor VS accordingly.

## 2024-05-28 NOTE — Progress Notes (Signed)
 PHARMACY - ANTICOAGULATION CONSULT NOTE  Pharmacy Consult for heparin  Indication: chest pain/ACS / NSTEMI  No Known Allergies  Patient Measurements: Height: 5' 9 (175.3 cm) Weight: 68 kg (150 lb) IBW/kg (Calculated) : 70.7 HEPARIN DW (KG): 68  Vital Signs: Temp: 98.3 F (36.8 C) (10/07 1630) Temp Source: Oral (10/07 1630) BP: 102/62 (10/07 1630) Pulse Rate: 69 (10/07 1630)  Labs: Recent Labs    05/28/24 0049 05/28/24 0051 05/28/24 0339 05/28/24 0639 05/28/24 0757 05/28/24 1204 05/28/24 1421 05/28/24 1611 05/28/24 1829  HGB 8.7* 8.8*  --  8.3*  --   --   --   --   --   HCT 28.4* 26.0*  --  25.8*  --   --   --   --   --   PLT 182  --   --  139*  --   --   --   --   --   APTT  --   --   --  34  --   --   --   --   --   LABPROT  --   --   --  17.2*  --   --   --   --   --   INR  --   --   --  1.3*  --   --   --   --   --   HEPARINUNFRC  --   --   --   --   --   --   --   --  0.14*  CREATININE 1.80* 1.80*  --  1.81*  --   --   --   --   --   TROPONINIHS 105*  --    < >  --    < > 924* 919* 868*  --    < > = values in this interval not displayed.    Estimated Creatinine Clearance: 32.4 mL/min (A) (by C-G formula based on SCr of 1.81 mg/dL (H)).   Medical History: Past Medical History:  Diagnosis Date   Acute on chronic respiratory failure with hypoxia (HCC)    Autonomic dysfunction    Chronic systolic heart failure (HCC)    Guillain Barr syndrome    2021   Paroxysmal atrial fibrillation (HCC)    Prostate cancer metastatic to multiple sites (HCC)    Severe sepsis (HCC)     Medications:  (Not in a hospital admission)  Scheduled:   aspirin EC  81 mg Oral Daily   atorvastatin  40 mg Oral Daily   furosemide  60 mg Intravenous BID   insulin aspart  0-9 Units Subcutaneous TID WC   isosorbide mononitrate  15 mg Oral Daily   Infusions:   heparin 850 Units/hr (05/28/24 0943)   PRN: acetaminophen **OR** acetaminophen, melatonin, ondansetron (ZOFRAN)  IV  Assessment: Patient with history of cardiomyopathy (EF 45 to 50% 2021, most recently 55 to 60% 2025), Guillain-Barr syndrome, PE, ?PAF not on anticoagulation (CHA2DsVASc = 4), hypertension, metastatic prostate cancer. Cardiology evaluated and consulted pharmacy for heparin infusion. Possible cardiac cath pending. Not on anticoagulation prior to admission.  Trop = 105, 484, 760. Hb 8.3, plt 139  10/7 PM - initial HL below goal. Hs-cTn peaked at 924, trending down to 868.  No infusion issues/bleeding per RN.   Of note, EF down to 30%, new G2DD with anterior wall, entire septum and apex are all akinetic.  Will likely need cath to rule out ischemic etiology.  Goal of Therapy:  Heparin level 0.3-0.7 units/ml Monitor platelets by anticoagulation protocol: Yes   Plan:  Increase heparin infusion to 1050 units/hr 8h heparin level F/u cath plans.  Maurilio Fila, PharmD Clinical Pharmacist 05/28/2024  7:04 PM
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
## 2024-05-28 NOTE — Progress Notes (Signed)
   05/28/24 0713  Oxygen Therapy/Pulse Ox  O2 Device (S)  Nasal Cannula  O2 Therapy (S)  Oxygen  O2 Flow Rate (L/min) (S)  2 L/min    Pt was taken off of BIPAP and placed on 2 L [place]. No respiratory distress noted with change. Pt is stable and comfortable at this time. RN made aware.

## 2024-05-28 NOTE — ED Notes (Signed)
 Pt BG taken was 82. Set up dinner meal tray for patient. Patient eating now.

## 2024-05-28 NOTE — Progress Notes (Signed)
 Progress Note   Patient: Joseph Mayo FMW:[id number] DOB: 10-12-1945 DOA: 05/28/2024     0 DOS: the patient was seen and examined on 05/28/2024   Brief hospital course: 78 y.o. 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   CMP notable for the following: Sodium 136, potassium 4.1, bicarb 22, creatinine 1.80 compared to 1.6 on 05/07/2024, glucose 260. He also does have a mild anemia noted be 9.2, avidin 2.8. Alkaline phosphatase 3600, otherwise liver enzymes are within normal limits. BNP 1135 compared to 310 in November 2021. High sensitive troponin I was initially 105, repeat value pending. CBC notable for white cell count 15,700 with 49% leukocytes, hemoglobin 8.7 associated Neuraceq/Norocarp properties and relative demonstration prior hemoglobin data point of 9.9 on 05/07/2024. COVID, influenza, RSV PCR all negative.   Assessment and Plan: #) Acute hypoxic respiratory failure due to flash pulmonary edema in the setting of acute on chronic systolic heart failure:  -Initially required bipap at time of presentation, continued on IV lasix -Now weaned to [facility] -Cardiology following.  -Cont metoprolol for now. Imdur 15mg  added per Cardiology. Cont lipitor -Plan adding hydralazine as able -trop rising in exess of 700's this AM. Echo reviewed. EF 30-35% with WMA with grade 2 diastolic dysfunction -Pt is on heparin gtt. Possibility of heart cath if pt tolerates heparin  Elevated procalictonin -Elevated at over 3.3 -Afebrile. Initial presenting WBC of 15.7k with repeat of 7.6k this AM -Pt clinically improved with just diuresis and  bipap -Doubt active infection, thus will d/c empiric abx. Elevated procal may be secondary to metastatic cancer hx   # (Elevated alkaline phosphatase level:  -Likely secondary to bone mets  -GGT normal    #) Hyperglycemia:  -Cont SSI as needed    #) CKD Stage 3B:  -Documented history of such, with baseline creatinine 1.7-2.0, -cont to follow BMET trends    #) Anemia of chronic disease:  -Hgb stable. -cont to follow cbc trends  Metastatic prostate cancer with diffuse osseous sclerotic lesions -Followed by Oncology at [facility]      Subjective: Feeling better this AM after lasix overnight and bipap.   Physical Exam: Vitals:   05/28/24 1215 05/28/24 1230 05/28/24 1330 05/28/24 1630  BP:  112/73 96/65 102/62  Pulse: 66 69 73 69  Resp: 16 (!) 22 20 13   Temp:   98.2 F (36.8 C) 98.3 F (36.8 C)  TempSrc:   Oral Oral  SpO2: 100% 100% 99% 94%  Weight:      Height:       General exam: Awake, laying in bed, in nad Respiratory system: Normal respiratory effort, no wheezing Cardiovascular system: regular rate, s1, s2 Gastrointestinal system: Soft, nondistended, positive BS Central nervous system: CN2-12 grossly intact, strength intact Extremities: Perfused, no clubbing Skin: Normal skin turgor, no notable skin lesions seen Psychiatry: Mood normal // no visual hallucinations   Data Reviewed:  Labs reviewed: Na 136, K 5.4, Cr 1.81, Alk phos 3174  Family Communication: Pt in room, family at bedside  Disposition: Status is: Inpatient Remains inpatient appropriate because: severity of illness  Planned Discharge Destination: Unclear at this time    Author: Garnette Pelt, MD 05/28/2024 5:57  PM  For on call review [email or web address].

## 2024-05-29 DIAGNOSIS — I5021 Acute systolic (congestive) heart failure: Secondary | ICD-10-CM | POA: Diagnosis not present

## 2024-05-29 DIAGNOSIS — N1832 Chronic kidney disease, stage 3b: Secondary | ICD-10-CM | POA: Diagnosis not present

## 2024-05-29 DIAGNOSIS — R7989 Other specified abnormal findings of blood chemistry: Secondary | ICD-10-CM | POA: Diagnosis not present

## 2024-05-29 DIAGNOSIS — J810 Acute pulmonary edema: Secondary | ICD-10-CM | POA: Diagnosis not present

## 2024-05-29 DIAGNOSIS — I2489 Other forms of acute ischemic heart disease: Secondary | ICD-10-CM

## 2024-05-29 LAB — BASIC METABOLIC PANEL WITH GFR
Anion gap: 11 (ref 5–15)
BUN: 25 mg/dL — ABNORMAL HIGH (ref 8–23)
CO2: 24 mmol/L (ref 22–32)
Calcium: 7.2 mg/dL — ABNORMAL LOW (ref 8.9–10.3)
Chloride: 104 mmol/L (ref 98–111)
Creatinine, Ser: 1.98 mg/dL — ABNORMAL HIGH (ref 0.61–1.24)
GFR, Estimated: 34 mL/min — ABNORMAL LOW (ref >60)
Glucose, Bld: 107 mg/dL — ABNORMAL HIGH (ref 70–99)
Potassium: 3.8 mmol/L (ref 3.5–5.1)
Sodium: 139 mmol/L (ref 135–145)

## 2024-05-29 LAB — CBC
HCT: 23.5 % — ABNORMAL LOW (ref 39.0–52.0)
Hemoglobin: 7.3 g/dL — ABNORMAL LOW (ref 13.0–17.0)
MCH: 30.7 pg (ref 26.0–34.0)
MCHC: 31.1 g/dL (ref 30.0–36.0)
MCV: 98.7 fL (ref 80.0–100.0)
Platelets: 130 K/uL — ABNORMAL LOW (ref 150–400)
RBC: 2.38 MIL/uL — ABNORMAL LOW (ref 4.22–5.81)
RDW: 17.2 % — ABNORMAL HIGH (ref 11.5–15.5)
WBC: 6.5 K/uL (ref 4.0–10.5)
nRBC: 0.9 % — ABNORMAL HIGH (ref 0.0–0.2)

## 2024-05-29 LAB — HEPARIN LEVEL (UNFRACTIONATED): Heparin Unfractionated: 0.18 [IU]/mL — ABNORMAL LOW (ref 0.30–0.70)

## 2024-05-29 LAB — GLUCOSE, CAPILLARY
Glucose-Capillary: 104 mg/dL — ABNORMAL HIGH (ref 70–99)
Glucose-Capillary: 86 mg/dL (ref 70–99)
Glucose-Capillary: 124 mg/dL — ABNORMAL HIGH (ref 70–99)
Glucose-Capillary: 160 mg/dL — ABNORMAL HIGH (ref 70–99)

## 2024-05-29 LAB — MAGNESIUM: Magnesium: 1.8 mg/dL (ref 1.7–2.4)

## 2024-05-29 MED ORDER — CARVEDILOL 3.125 MG PO TABS
3.1250 mg | ORAL_TABLET | Freq: Two times a day (BID) | ORAL | Status: DC
  Administered 2024-05-29 (×2): 3.125 mg via ORAL
  Filled 2024-05-29 (×2): qty 1

## 2024-05-29 MED ORDER — HEPARIN SODIUM (PORCINE) 5000 UNIT/ML IJ SOLN
5000.0000 [IU] | Freq: Three times a day (TID) | INTRAMUSCULAR | Status: DC
  Administered 2024-05-29 – 2024-05-30 (×3): 5000 [IU] via SUBCUTANEOUS
  Filled 2024-05-29 (×3): qty 1

## 2024-05-29 MED ORDER — CARVEDILOL 6.25 MG PO TABS
6.2500 mg | ORAL_TABLET | Freq: Two times a day (BID) | ORAL | Status: DC
  Administered 2024-05-30: 6.25 mg via ORAL
  Filled 2024-05-29: qty 1

## 2024-05-29 MED ORDER — CARVEDILOL 3.125 MG PO TABS
3.1250 mg | ORAL_TABLET | Freq: Once | ORAL | Status: AC
  Administered 2024-05-29: 3.125 mg via ORAL

## 2024-05-29 MED ORDER — FUROSEMIDE 10 MG/ML IJ SOLN
40.0000 mg | Freq: Once | INTRAMUSCULAR | Status: DC
Start: 2024-05-29 — End: 2024-05-30

## 2024-05-29 NOTE — Progress Notes (Signed)
 Patient received via stretcher with E.R. staff alert and oriented. No complaints voiced. Oriented toroom and Reden R.N. into see patient.

## 2024-05-29 NOTE — ED Notes (Signed)
 Floor notified patient coming up

## 2024-05-29 NOTE — Progress Notes (Signed)
 Cardiology Progress Note  Patient ID: Joseph Mayo MRN: [id number] DOB: 1946-07-24 Date of Encounter: 05/29/2024 Primary Cardiologist: None  Subjective   Chief Complaint: None.   HPI: EF reduced.  Wall motion abnormality consistent with stress-induced cardiomyopathy.  ROS:  All other ROS reviewed and negative. Pertinent positives noted in the HPI.     Telemetry  Overnight telemetry shows sinus rhythm 70s, which I personally reviewed.   ECG  The most recent ECG shows sinus rhythm heart rate 76, diffuse deep T wave inversions, which I personally reviewed.   Physical Exam   Vitals:   05/29/24 0530 05/29/24 0600 05/29/24 0652 05/29/24 0740  BP: 108/62  112/65 119/60  Pulse: 70  73 76  Resp: (!) 22  20 16   Temp:   98.6 F (37 C) 97.9 F (36.6 C)  TempSrc:   Oral Oral  SpO2: 98%  100% 99%  Weight:  69.3 kg    Height:  5' 9 (1.753 m)      Intake/Output Summary (Last 24 hours) at 05/29/2024 0909 Last data filed at 05/29/2024 0836 Gross per 24 hour  Intake 340 ml  Output 2900 ml  Net -2560 ml       05/29/2024    6:00 AM 05/28/2024   12:30 AM  Last 3 Weights  Weight (lbs) 152 lb 12.8 oz 150 lb  Weight (kg) 69.31 kg 68.04 kg    Body mass index is 22.56 kg/m.  General: Well nourished, well developed, in no acute distress Head: Atraumatic, normal size  Eyes: PEERLA, EOMI  Neck: Supple, no JVD Endocrine: No thryomegaly Cardiac: Normal S1, S2; RRR; no murmurs, rubs, or gallops Lungs: Clear to auscultation bilaterally, no wheezing, rhonchi or rales  Abd: Soft, nontender, no hepatomegaly  Ext: No edema, pulses 2+ Musculoskeletal: No deformities, BUE and BLE strength normal and equal Skin: Warm and dry, no rashes   Neuro: Alert and oriented to person, place, time, and situation, CNII-XII grossly intact, no focal deficits  Psych: Normal mood and affect   Cardiac Studies  TTE 05/28/2024  1. Left ventricular ejection fraction, by estimation, is 30 to 35%. The  left  ventricle has moderately decreased function. The left ventricle  demonstrates regional wall motion abnormalities (see scoring  diagram/findings for description). The left  ventricular internal cavity size was moderately to severely dilated. Left  ventricular diastolic parameters are consistent with Grade II diastolic  dysfunction (pseudonormalization). There is anterior and septal wall  akinesis with hypokinesis elsewhere.   2. Right ventricular systolic function is normal. The right ventricular  size is normal.   3. Left atrial size was moderately dilated.   4. The mitral valve is normal in structure. Trivial mitral valve  regurgitation.   5. The aortic valve is tricuspid. Aortic valve regurgitation is not  visualized. No aortic stenosis is present.   Patient Profile  Joseph Mayo is a 78 y.o. male with metastatic prostate cancer, CKD stage IIIb, systolic heart failure, hypertension admitted on 05/28/2024 with acute hypoxic respiratory failure and acute systolic heart failure.  Echocardiogram consistent with stress-induced cardiomyopathy.  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  He has akinesis of all apical segments across multiple coronary territories.  I do not suspect this is LAD infarction as he has no ST elevation.  He has diffuse T wave inversions which go against Wellen sign. - Troponins are  minimally elevated for this level of LV dysfunction.  This represented acute LAD infarction as troponins will be greater than 24,000. - Overall the picture is more consistent with a stress-induced pattern.  We will treat this medically. - Good diuresis.  1 more dose of 40 mg IV Lasix today. - Start carvedilol 3.125 mg twice daily. - He has no chest pains.  His edema is improving.  Edema is improving.  We will plan to add Imdur hydralazine today or tomorrow. - Plan  for 3 to 6 months of medical therapy to reevaluate ejection fraction.  # Elevated troponin, demand ischemia - Suspect this is secondary to acute systolic heart failure and stress-induced cardiomyopathy.  Hemoglobin is trending down.  We will stop aspirin and heparin.  I do not think he is a good candidate for invasive coronary angiography nor do I think it is necessary at this time.  This can be treated medically. - Continue Lipitor 40 mg daily.  # Anemia - Hemoglobin trending down.  Stopping aspirin heparin.  No signs of bleeding.  Likely related to malignancy.  # CKD 3B - Kidney function stable.  Diuresis as above.  # Metastatic prostate cancer # Anemia # Thrombocytopenia - On outpatient treatment.  Plan to manage his condition medically from a cardiovascular standpoint.     For questions or updates, please contact [facility] Please consult [email or web address] for contact info under        Signed, Darryle T. Barbaraann, MD, [facility]  [facility]  05/29/2024 9:09 AM

## 2024-05-29 NOTE — Progress Notes (Signed)
 Patient went into afib asymptomatic HR 80-120s. MD notified. EKG done, placed in chart.

## 2024-05-29 NOTE — Progress Notes (Addendum)
 PROGRESS NOTE    Joseph Mayo  FMW:[id number] DOB: 1946-02-28 DOA: 05/28/2024 PCP: [facility], [facility]   Brief Narrative:  78 y.o. 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 He is being treated for acute hypoxic respiratory failure in the setting of pulmonary edema secondary to acute on chronic systolic heart failure.  Cardiology is on board.   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  Not requiring any supplemental oxygen now -Cardiology following.  - Continue with low-dose Coreg and lipitor - Further GDMT addition depends on the blood pressure. - Off of heparin drip.  Troponinemia/Demand supply ischemia,POA: -In the setting of acute CHF -No cardiac interventions needed   Elevated procalictonin -Elevated at over 3.3 -Afebrile. Initial presenting WBC of 15.7k with repeat of 7.6k. WBC is within normal limits now. -Pt clinically improved with just diuresis and  bipap -Doubt active infection    Elevated alkaline phosphatase level:  -Likely secondary to bone mets  -GGT normal    Hyperglycemia:  -Cont SSI as needed     CKD Stage 3B:  -Documented history of such, with baseline creatinine 1.7-2.0, -cont to follow BMET trends    Anemia of chronic disease:  -Hgb trending downwards. Held aspirin and heparin. -cont to follow cbc trends   Metastatic prostate cancer with diffuse osseous sclerotic lesions -Followed by Oncology at [facility]   DVT prophylaxis: heparin injection 5,000 Units Start: 05/29/24 1400 SCDs Start: 05/28/24 0257     Code Status: Full Code Family Communication:  None at the bedside Status is: Inpatient Remains inpatient appropriate because: Acute CHF    Subjective:  He feels better in terms of his shortness of breath. He was seen by cardiology this morning. BP was low overnight so imdur was held.   Examination:  General exam: Appears calm and comfortable  Respiratory system: Clear to auscultation. Respiratory effort normal. Cardiovascular system: S1 & S2 heard, RRR. No JVD, murmurs, rubs, gallops or clicks. No pedal edema. Gastrointestinal system: Abdomen is nondistended, soft and nontender. No organomegaly or masses felt. Normal bowel sounds heard. Central nervous system: Alert and oriented. No focal neurological deficits. Extremities: Symmetric 5 x 5 power. Skin: No rashes, lesions or ulcers Psychiatry: Judgement and insight appear normal. Mood & affect appropriate.    Diet Orders (From admission, onward)     Start     Ordered   05/28/24 0900  Diet heart healthy/carb modified Room service appropriate? Yes; Fluid consistency: Thin  Diet effective now       Question Answer Comment  Diet-HS Snack? Nothing  Room service appropriate? Yes   Fluid consistency: Thin      05/28/24 0859            Objective: Vitals:   05/29/24 0530 05/29/24 0600 05/29/24 0652 05/29/24 0740  BP: 108/62  112/65  119/60  Pulse: 70  73 76  Resp: (!) 22  20 16   Temp:   98.6 F (37 C) 97.9 F (36.6 C)  TempSrc:   Oral Oral  SpO2: 98%  100% 99%  Weight:  69.3 kg    Height:  5' 9 (1.753 m)      Intake/Output Summary (Last 24 hours) at 05/29/2024 0951 Last data filed at 05/29/2024 0836 Gross per 24 hour  Intake 340 ml  Output 2900 ml  Net -2560 ml   Filed Weights   05/28/24 0030 05/29/24 0600  Weight: 68 kg 69.3 kg    Scheduled Meds:  atorvastatin  40 mg Oral Daily   carvedilol  3.125 mg Oral BID WC   furosemide  40 mg Intravenous Once   heparin injection (subcutaneous)  5,000 Units Subcutaneous Q8H   insulin aspart  0-9 Units Subcutaneous TID WC   Continuous Infusions:  Nutritional status     Body mass index is 22.56 kg/m.  Data Reviewed:   CBC: Recent Labs  Lab 05/28/24 0049 05/28/24 0051 05/28/24 0639 05/29/24 0406  WBC 15.7*  --  7.6 6.5  NEUTROABS 5.4  --  5.3  --   HGB 8.7* 8.8* 8.3* 7.3*  HCT 28.4* 26.0* 25.8* 23.5*  MCV 100.0  --  97.0 98.7  PLT 182  --  139* 130*   Basic Metabolic Panel: Recent Labs  Lab 05/28/24 0049 05/28/24 0051 05/28/24 0339 05/28/24 0639 05/28/24 1204 05/29/24 0406  NA 136 139  --  136  --  139  K 4.1 4.2  --  5.4* 4.6 3.8  CL 104 106  --  105  --  104  CO2 22  --   --  21*  --  24  GLUCOSE 268* 262*  --  132*  --  107*  BUN 18 19  --  20  --  25*  CREATININE 1.80* 1.80*  --  1.81*  --  1.98*  CALCIUM 8.2*  --   --  8.2*  --  7.2*  MG  --   --  2.1 2.0  --  1.8  PHOS  --   --   --  3.4  --   --    GFR: Estimated Creatinine Clearance: 30.1 mL/min (A) (by C-G formula based on SCr of 1.98 mg/dL (H)). Liver Function Tests: Recent Labs  Lab 05/28/24 0049 05/28/24 0639  AST 25 29  ALT 12 10  ALKPHOS 3,626* 3,174*  BILITOT 0.8 1.0  PROT 6.1* 5.8*  ALBUMIN 2.8* 2.7*   No results for input(s): LIPASE, AMYLASE in the last 168 hours. No results for input(s): AMMONIA in the last 168 hours. Coagulation Profile: Recent  Labs  Lab 05/28/24 0639  INR 1.3*   Cardiac Enzymes: No results for input(s): CKTOTAL, CKMB, CKMBINDEX, TROPONINI in the last 168 hours. BNP (last 3 results) No results for input(s): PROBNP in the last 8760 hours. HbA1C: Recent Labs    05/28/24 0639  HGBA1C 5.6   CBG: Recent Labs  Lab 05/28/24 0756 05/28/24 1147 05/28/24 1815 05/29/24 0659  GLUCAP 122* 94 82 104*   Lipid Profile: No results for input(s): CHOL, HDL, LDLCALC, TRIG, CHOLHDL, LDLDIRECT in the last 72 hours.  Thyroid Function Tests: No results for input(s): TSH, T4TOTAL, FREET4, T3FREE, THYROIDAB in the last 72 hours. Anemia Panel: Recent Labs    05/28/24 0639  FERRITIN 3,449*  TIBC 193*  IRON 124   Sepsis Labs: Recent Labs  Lab 05/28/24 0639 05/28/24 1022  PROCALCITON 3.30  --   LATICACIDVEN  --  1.5    Recent Results (from the past 240 hours)  Resp panel by RT-PCR (RSV, Flu A&B, Covid) Anterior Nasal Swab     Status: None   Collection Time: 05/28/24  1:41 AM   Specimen: Anterior Nasal Swab  Result Value Ref Range Status   SARS Coronavirus 2 by RT PCR NEGATIVE NEGATIVE Final   Influenza A by PCR NEGATIVE NEGATIVE Final   Influenza B by PCR NEGATIVE NEGATIVE Final    Comment: (NOTE) The Xpert Xpress SARS-CoV-2/FLU/RSV plus assay is intended as an aid in the diagnosis of influenza from Nasopharyngeal swab specimens and should not be used as a sole basis for treatment. Nasal washings and aspirates are unacceptable for Xpert Xpress SARS-CoV-2/FLU/RSV testing.  Fact Sheet for Patients: BloggerCourse.com  Fact Sheet for Healthcare Providers: SeriousBroker.it  This test is not yet approved or cleared by the [place]  FDA and has been authorized for detection and/or diagnosis of SARS-CoV-2 by FDA under an Emergency Use Authorization (EUA). This EUA will remain in effect (meaning this test can be used) for the  duration of the COVID-19 declaration under Section 564(b)(1) of the Act, 21 U.S.C. section 360bbb-3(b)(1), unless the authorization is terminated or revoked.     Resp Syncytial Virus by PCR NEGATIVE NEGATIVE Final    Comment: (NOTE) Fact Sheet for Patients: BloggerCourse.com  Fact Sheet for Healthcare Providers: SeriousBroker.it  This test is not yet approved or cleared by the [place]  FDA and has been authorized for detection and/or diagnosis of SARS-CoV-2 by FDA under an Emergency Use Authorization (EUA). This EUA will remain in effect (meaning this test can be used) for the duration of the COVID-19 declaration under Section 564(b)(1) of the Act, 21 U.S.C. section 360bbb-3(b)(1), unless the authorization is terminated or revoked.  Performed at [facility], [address] [place], [place] [address]          Radiology Studies: ECHOCARDIOGRAM COMPLETE Result Date: 05/28/2024    ECHOCARDIOGRAM REPORT   Patient Name:   Joseph Mayo Date of Exam: 05/28/2024 Medical Rec #:  [id number]          Height:       69.0 in Accession #:    [id number]         Weight:       150.0 lb Date of Birth:  [birth year]           BSA:          1.828 m Patient Age:    78 years           BP:           96/66 mmHg Patient Gender: M                  HR:           69 bpm. Exam Location:  Inpatient Procedure: 2D Echo, Cardiac Doppler, Color Doppler and Intracardiac            Opacification Agent (Both Spectral and Color Flow Doppler were            utilized during procedure). Indications:    CHF I50.31  History:        Patient has prior history of Echocardiogram examinations, most                 recent 07/21/2020. CHF, Arrythmias:Atrial Fibrillation;                 Signs/Symptoms:Shortness of Breath and Edema. H/O Metastatic                 prostate cancer.  Sonographer:    BERNARDA ROCKS Referring Phys: [id number] JUSTIN B HOWERTER IMPRESSIONS  1. Left  ventricular ejection fraction, by estimation, is 30 to 35%. The left ventricle has moderately decreased function. The left ventricle demonstrates regional wall motion abnormalities (see scoring diagram/findings for description). The left ventricular internal cavity size was moderately to severely dilated. Left ventricular diastolic parameters are consistent with Grade II diastolic dysfunction (pseudonormalization). There is anterior and septal wall akinesis with hypokinesis elsewhere.  2. Right ventricular systolic function is normal. The right ventricular size is normal.  3. Left atrial size was moderately dilated.  4. The mitral valve is normal in structure. Trivial mitral valve regurgitation.  5. The aortic valve is tricuspid. Aortic valve regurgitation is not visualized. No aortic stenosis is present. FINDINGS  Left Ventricle: Left ventricular ejection fraction, by estimation, is 30 to 35%. The left ventricle has moderately decreased function. The left ventricle demonstrates regional wall motion abnormalities. Definity contrast agent was given IV to delineate the left ventricular endocardial borders. The left ventricular internal cavity size was moderately to severely dilated. There is no left ventricular hypertrophy. Left ventricular diastolic parameters are consistent with Grade II diastolic dysfunction (pseudonormalization).  LV Wall Scoring: The entire anterior wall, entire septum, and apex are akinetic. The antero-lateral wall, mid and distal lateral wall, and entire inferior wall are hypokinetic. The basal inferolateral segment is normal. There is anterior and septal wall akinesis with hypokinesis elsewhere. Right Ventricle: The right ventricular size is normal. No increase in right ventricular wall thickness. Right ventricular systolic function is normal. Left Atrium: Left atrial size was moderately dilated. Right Atrium: Right atrial size was normal in size. Pericardium: There is no evidence of  pericardial effusion. Mitral Valve: The mitral valve is normal in structure. Trivial mitral valve regurgitation. MV peak gradient, 2.1 mmHg. The mean mitral valve gradient is 1.0 mmHg. Tricuspid Valve: The tricuspid valve is normal in structure. Tricuspid valve regurgitation is trivial. Aortic Valve: The aortic valve is tricuspid. Aortic valve regurgitation is not visualized. No aortic stenosis is present. Aortic valve mean gradient measures 3.0 mmHg. Aortic valve peak gradient measures 6.0 mmHg. Aortic valve area, by VTI measures 2.86 cm. Pulmonic Valve: The pulmonic valve was not well visualized. Pulmonic valve regurgitation is not visualized. Aorta: The aortic root and ascending aorta are structurally normal, with no evidence of dilitation. IAS/Shunts: No atrial level shunt detected by color flow Doppler.  LEFT VENTRICLE PLAX 2D LVIDd:         5.50 cm      Diastology LVIDs:         4.40 cm      LV e' medial:    6.53 cm/s LV PW:         0.80 cm      LV E/e' medial:  11.5 LV IVS:        0.90 cm      LV e' lateral:   6.74 cm/s LVOT diam:     2.10 cm      LV E/e' lateral:  11.2 LV SV:         55 LV SV Index:   30 LVOT Area:     3.46 cm  LV Volumes (MOD) LV vol d, MOD A2C: 226.0 ml LV vol d, MOD A4C: 181.0 ml LV vol s, MOD A2C: 159.0 ml LV vol s, MOD A4C: 113.0 ml LV SV MOD A2C:     67.0 ml LV SV MOD A4C:     181.0 ml LV SV MOD BP:      72.9 ml RIGHT VENTRICLE             IVC RV Basal diam:  3.10 cm     IVC diam: 1.90 cm RV S prime:     12.40 cm/s TAPSE (M-mode): 2.3 cm LEFT ATRIUM             Index        RIGHT ATRIUM           Index LA diam:        4.50 cm 2.46 cm/m   RA Area:     17.30 cm LA Vol (A2C):   68.7 ml 37.58 ml/m  RA Volume:   49.70 ml  27.18 ml/m LA Vol (A4C):   87.2 ml 47.70 ml/m LA Biplane Vol: 81.4 ml 44.52 ml/m  AORTIC VALVE                    PULMONIC VALVE AV Area (Vmax):    2.45 cm     PV Vmax:          0.68 m/s AV Area (Vmean):   2.62 cm     PV Peak grad:     1.9 mmHg AV Area (VTI):      2.86 cm     PR End Diast Vel: 1.54 msec AV Vmax:           122.00 cm/s AV Vmean:          72.900 cm/s AV VTI:            0.194 m AV Peak Grad:      6.0 mmHg AV Mean Grad:      3.0 mmHg LVOT Vmax:         86.20 cm/s LVOT Vmean:        55.100 cm/s LVOT VTI:          0.160 m LVOT/AV VTI ratio: 0.82  AORTA Ao Root diam: 3.50 cm Ao Asc diam:  3.80 cm MITRAL VALVE MV Area (PHT): 3.23 cm    SHUNTS MV Area VTI:   2.50 cm    Systemic VTI:  0.16 m MV Peak grad:  2.1 mmHg    Systemic Diam: 2.10 cm MV Mean grad:  1.0 mmHg MV Vmax:       0.73 m/s MV Vmean:      46.7 cm/s MV Decel Time: 235 msec MR Peak grad: 32.3 mmHg MR Vmax:      284.00 cm/s MV E velocity: 75.40 cm/s MV A velocity: 53.10 cm/s MV E/A ratio:  1.42 Franck Azobou Tonleu Electronically signed by Joelle Cedars Tonleu Signature Date/Time: 05/28/2024/1:22:52 PM    Final    DG Chest Portable 1 View Result Date: 05/28/2024 CLINICAL DATA:  Hypoxia., . Pt states that he woke up and started having issues breathing. EXAM: PORTABLE CHEST 1 VIEW COMPARISON:  Chest x-ray 07/23/2020 FINDINGS: The heart and mediastinal contours are within normal limits. Diffuse patchy airspace and interstitial opacities. No pleural effusion. No  pneumothorax. No acute osseous abnormality. IMPRESSION: Diffuse patchy airspace and interstitial opacities. Electronically Signed   By: Morgane  Naveau M.D.   On: 05/28/2024 01:24           LOS: 1 day   Time spent= 39 mins    Deliliah Room, MD [facility]  If 7PM-7AM, please contact night-coverage  05/29/2024, 9:51 AM

## 2024-05-29 NOTE — Progress Notes (Signed)
 Patient developed atrial fibrillation.  Rate controlled on carvedilol.  Cannot be anticoagulated due to hemoglobin dropping.  Would recommend rate control strategy for now.  I have increased his carvedilol to 6.25 mg twice daily.  He received 3.125 mg this afternoon.  Increased dose with an extra 3.125 mg this afternoon.  Cardiology follow back up tomorrow.  He seems to be asymptomatic from his A-fib.  Signed, Darryle DASEN. Barbaraann, MD, [facility]  [facility]  [address] [place], [place] [address] [phone]  4:42 PM

## 2024-05-29 NOTE — Progress Notes (Signed)
 EKG done. Cardiologist present.

## 2024-05-30 ENCOUNTER — Other Ambulatory Visit (HOSPITAL_COMMUNITY): Payer: Self-pay

## 2024-05-30 DIAGNOSIS — J810 Acute pulmonary edema: Secondary | ICD-10-CM | POA: Diagnosis not present

## 2024-05-30 DIAGNOSIS — I480 Paroxysmal atrial fibrillation: Secondary | ICD-10-CM | POA: Diagnosis not present

## 2024-05-30 DIAGNOSIS — N1832 Chronic kidney disease, stage 3b: Secondary | ICD-10-CM | POA: Diagnosis not present

## 2024-05-30 DIAGNOSIS — I5043 Acute on chronic combined systolic (congestive) and diastolic (congestive) heart failure: Secondary | ICD-10-CM | POA: Diagnosis not present

## 2024-05-30 LAB — BASIC METABOLIC PANEL WITH GFR
Anion gap: 12 (ref 5–15)
BUN: 27 mg/dL — ABNORMAL HIGH (ref 8–23)
CO2: 24 mmol/L (ref 22–32)
Calcium: 7.7 mg/dL — ABNORMAL LOW (ref 8.9–10.3)
Chloride: 104 mmol/L (ref 98–111)
Creatinine, Ser: 2.04 mg/dL — ABNORMAL HIGH (ref 0.61–1.24)
GFR, Estimated: 33 mL/min — ABNORMAL LOW (ref >60)
Glucose, Bld: 87 mg/dL (ref 70–99)
Potassium: 3.8 mmol/L (ref 3.5–5.1)
Sodium: 140 mmol/L (ref 135–145)

## 2024-05-30 LAB — GLUCOSE, CAPILLARY: Glucose-Capillary: 88 mg/dL (ref 70–99)

## 2024-05-30 LAB — CBC
HCT: 24.7 % — ABNORMAL LOW (ref 39.0–52.0)
Hemoglobin: 7.9 g/dL — ABNORMAL LOW (ref 13.0–17.0)
MCH: 30.9 pg (ref 26.0–34.0)
MCHC: 32 g/dL (ref 30.0–36.0)
MCV: 96.5 fL (ref 80.0–100.0)
Platelets: 142 K/uL — ABNORMAL LOW (ref 150–400)
RBC: 2.56 MIL/uL — ABNORMAL LOW (ref 4.22–5.81)
RDW: 17.1 % — ABNORMAL HIGH (ref 11.5–15.5)
WBC: 5.6 K/uL (ref 4.0–10.5)
nRBC: 1.6 % — ABNORMAL HIGH (ref 0.0–0.2)

## 2024-05-30 MED ORDER — HYDRALAZINE HCL 25 MG PO TABS
25.0000 mg | ORAL_TABLET | Freq: Three times a day (TID) | ORAL | Status: DC
Start: 2024-05-30 — End: 2024-05-30

## 2024-05-30 MED ORDER — FUROSEMIDE 20 MG PO TABS
20.0000 mg | ORAL_TABLET | Freq: Every day | ORAL | 0 refills | Status: AC | PRN
Start: 2024-05-30 — End: 2025-05-30
  Filled 2024-05-30: qty 30, 30d supply, fill #0

## 2024-05-30 MED ORDER — ATORVASTATIN CALCIUM 40 MG PO TABS
40.0000 mg | ORAL_TABLET | Freq: Every day | ORAL | 0 refills | Status: DC
  Filled 2024-05-30: qty 30, 30d supply, fill #0

## 2024-05-30 MED ORDER — CARVEDILOL 6.25 MG PO TABS: 6.2500 mg | ORAL_TABLET | Freq: Two times a day (BID) | ORAL | 0 refills | Status: DC

## 2024-05-30 MED ORDER — CARVEDILOL 6.25 MG PO TABS
6.2500 mg | ORAL_TABLET | Freq: Two times a day (BID) | ORAL | 0 refills | Status: AC
  Filled 2024-05-30: qty 60, 30d supply, fill #0

## 2024-05-30 MED ORDER — ISOSORBIDE MONONITRATE ER 30 MG PO TB24: 15.0000 mg | ORAL_TABLET | Freq: Every day | ORAL | 0 refills | Status: DC

## 2024-05-30 MED ORDER — FUROSEMIDE 20 MG PO TABS: 20.0000 mg | ORAL_TABLET | Freq: Every day | ORAL | 0 refills | Status: DC | PRN

## 2024-05-30 MED ORDER — ISOSORBIDE MONONITRATE ER 30 MG PO TB24
15.0000 mg | ORAL_TABLET | Freq: Every day | ORAL | 0 refills | Status: DC
  Filled 2024-05-30: qty 15, 30d supply, fill #0

## 2024-05-30 MED ORDER — ATORVASTATIN CALCIUM 40 MG PO TABS: 40.0000 mg | ORAL_TABLET | Freq: Every day | ORAL | 0 refills | Status: DC

## 2024-05-30 MED ORDER — ISOSORBIDE MONONITRATE ER 30 MG PO TB24
15.0000 mg | ORAL_TABLET | Freq: Every day | ORAL | Status: DC
  Administered 2024-05-30: 15 mg via ORAL
  Filled 2024-05-30: qty 1

## 2024-05-30 NOTE — Progress Notes (Addendum)
 Discharge:  Daughter verbally understands discharge POC.  Directions for Lasix PO.:  Per Dr. Farhan, Take as needed if he feels short of breath  or has lower extremity edema.  NO PIV NO TELE.   Additional education included in packet.  TOC meds pending.  Discharge lounge notified for transport.

## 2024-05-30 NOTE — Progress Notes (Signed)
 Cardiology Progress Note  Patient ID: BARY LIMBACH MRN: [id number] DOB: 1945/09/21 Date of Encounter: 05/30/2024 Primary Cardiologist: None  Subjective   Chief Complaint: None.   HPI: No chest pain or trouble breathing.  Euvolemic on exam.  Has returned to sinus rhythm.  ROS:  All other ROS reviewed and negative. Pertinent positives noted in the HPI.     Telemetry  Overnight telemetry shows normal sinus rhythm heart rate 70s, which I personally reviewed.   ECG  The most recent ECG shows SR 94 bpm, anterior TWI, which I personally reviewed.   Physical Exam   Vitals:   05/30/24 0413 05/30/24 0500 05/30/24 0935 05/30/24 0939  BP: (!) 144/77  96/84 116/61  Pulse: 72  78   Resp: 16     Temp: 98.2 F (36.8 C)  98 F (36.7 C)   TempSrc: Oral  Oral   SpO2: 96%     Weight:  69.2 kg    Height:        Intake/Output Summary (Last 24 hours) at 05/30/2024 0943 Last data filed at 05/30/2024 0600 Gross per 24 hour  Intake 603.96 ml  Output 2175 ml  Net -1571.04 ml       05/30/2024    5:00 AM 05/29/2024    6:00 AM 05/28/2024   12:30 AM  Last 3 Weights  Weight (lbs) 152 lb 8.9 oz 152 lb 12.8 oz 150 lb  Weight (kg) 69.2 kg 69.31 kg 68.04 kg    Body mass index is 22.53 kg/m.  General: Well nourished, well developed, in no acute distress Head: Atraumatic, normal size  Eyes: PEERLA, EOMI  Neck: Supple, no JVD Endocrine: No thryomegaly Cardiac: Normal S1, S2; RRR; no murmurs, rubs, or gallops Lungs: Clear to auscultation bilaterally, no wheezing, rhonchi or rales  Abd: Soft, nontender, no hepatomegaly  Ext: No edema, pulses 2+ Musculoskeletal: No deformities, BUE and BLE strength normal and equal Skin: Warm and dry, no rashes   Neuro: Alert and oriented to person, place, time, and situation, CNII-XII grossly intact, no focal deficits  Psych: Normal mood and affect   Cardiac Studies  TTE 05/28/2024  1. Left ventricular ejection fraction, by estimation, is 30 to 35%. The   left ventricle has moderately decreased function. The left ventricle  demonstrates regional wall motion abnormalities (see scoring  diagram/findings for description). The left  ventricular internal cavity size was moderately to severely dilated. Left  ventricular diastolic parameters are consistent with Grade II diastolic  dysfunction (pseudonormalization). There is anterior and septal wall  akinesis with hypokinesis elsewhere.   2. Right ventricular systolic function is normal. The right ventricular  size is normal.   3. Left atrial size was moderately dilated.   4. The mitral valve is normal in structure. Trivial mitral valve  regurgitation.   5. The aortic valve is tricuspid. Aortic valve regurgitation is not  visualized. No aortic stenosis is present.   Patient Profile  Joseph Mayo is a 78 y.o. male with metastatic prostate cancer, CKD stage IIIb, systolic heart failure, hypertension admitted on 05/28/2024 with acute systolic heart failure.  Findings are consistent with a stress-induced cardiomyopathy.  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  He has apical akinesis spanning the multiple coronary territories.  No ST elevation.  Troponins are minimal.  This is likely fitting with a stress-induced or Takotsubo cardiomyopathy.  We will treat this medically. - He has  metastatic prostate cancer and anemia.  He did not tolerate heparin.  He has advanced kidney disease.  There is more reason to treat this medically.  He also has no symptoms of chest pain.  He is euvolemic on exam. - Recommend to continue carvedilol 6.25 mg twice daily.  I have added Imdur 15 mg daily.  Can add hydralazine as an outpatient.  Would recommend Lasix 20 mg p.o. as needed at discharge. - Not a candidate for ACE/ARB/Arni/MRA given significant CKD. - Euvolemic and optimized medically.  Stable for discharge. - He follows at the [facility]  cardiology in [place].  # Paroxysmal A-fib - Brief episode.  Now back in rhythm.  Continue carvedilol 6.25 mg twice daily.  Given metastatic prostate cancer and recurrent anemia, recommend against anticoagulation.  Discussed this with the patient and daughter.  They are in agreement.  # Elevated troponin, demand ischemia - Secondary to acute systolic heart failure.  Hemoglobin values trended down on aspirin and heparin.  No signs of bleeding but we will not recommend this.  Recommended against invasive angiography as well.  Continue statin.  # Anemia - Hemoglobin trended down aspirin and heparin.  No plans for antiplatelet or anticoagulation moving forward.  He has ongoing needs for chemotherapy as well.  # Metastatic prostate cancer - [facility]  [facility] will sign off.   The patient is ready for discharge today from a cardiac standpoint. Medication Recommendations: Medications as above Other recommendations (labs, testing, etc): Follow-up echo in 3 to 6 months Follow up as an outpatient: He will follow-up with cardiology at the [facility]  For questions or updates, please contact [facility] Please consult [email or web address] for contact info under         Signed, Darryle T. Barbaraann, MD, [facility]  [facility]  05/30/2024 9:43 AM

## 2024-05-30 NOTE — Progress Notes (Signed)
 Meds ready   transferring to Pharm then Main A.

## 2024-05-30 NOTE — Discharge Summary (Addendum)
 Physician Discharge Summary   Patient: Joseph Mayo MRN: [id number] DOB: 05/25/1946  Admit date:     05/28/2024  Discharge date: 05/30/24  Discharge Physician: Deliliah Room   PCP: [facility]   Recommendations at discharge:    F/u with your PCP in one week F/u with your cardiologist at [facility] on the scheduled appointment. Continue taking meds as prescribed.  Discharge Diagnoses: Principal Problem:   Flash pulmonary edema (HCC) Active Problems:   Acute respiratory failure with hypoxia (HCC)   SOB (shortness of breath)   Acute on chronic combined systolic and diastolic CHF (congestive heart failure) (HCC)   Elevated troponin   Alkaline phosphatase elevation   Hyperglycemia   Leukocytosis   CKD stage 3b, GFR 30-44 ml/min (HCC)   Anemia of chronic disease   Hospital Course:  78 y.o. male with medical history significant for metastatic prostate cancer, chronic systolic/diastolic heart failure, CKD 3B with baseline creatinine 1.7-2.0, anemia of chronic disease with baseline hemoglobin range 7-10, who is admitted to [facility] on 05/28/2024 with acute hypoxic respiratory failure in the setting of flash pulmonary edema after presenting from home to [facility] ED complaining of shortness of breath. 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   Cardiology consulted. Started on diuretics and symptoms improved. He was not requiring any supplemental oxygen on discharge.  Case discussed with Dr Barbaraann. Cardiologist at the bedside on the discharge day and patient was discharged on imdur, lasix prn 20 mg and coreg 6.25 mg PO BID. Hydralazine can be added as an outpatient if needed. Patient will f/u with his own cardiologist at [facility] (06/06/24) for further management and follow up with oncologist at [facility]  (10/15).  Discussed with his family at the bedside on the discharge day.  Of note, he did have a brief episode of afib on 10/8 but he is back in sinus rhythm now. Not a candidate for anticoagulation in the setting of metastatic prostate cancer and worsening anemia.        Consultants: Cardiology Procedures performed: none  Disposition: Home Diet recommendation:  Cardiac diet DISCHARGE MEDICATION: Allergies as of 05/30/2024   No Known Allergies      Medication List     STOP taking these medications    ibuprofen 200 MG tablet Commonly known as: ADVIL   metoprolol succinate 25 MG 24 hr tablet Commonly known as: TOPROL-XL   sodium bicarbonate 650 MG tablet   sodium zirconium cyclosilicate 10 g Pack packet Commonly known as: LOKELMA       TAKE these medications    atorvastatin 40 MG tablet Commonly known as: LIPITOR Take 1 tablet (40 mg total) by mouth daily. Start taking on: May 31, 2024   B-12 PO Take 1 tablet by mouth daily.   calcitRIOL 0.25 MCG capsule Commonly known as: ROCALTROL Take 0.25 mcg by mouth daily.   calcium carbonate 500 MG chewable tablet Commonly known as: TUMS - dosed in mg elemental calcium Chew 1,000 mg by mouth 2 (two) times daily.   carvedilol 6.25 MG tablet Commonly known as: COREG Take 1 tablet (6.25 mg total) by mouth 2 (two) times daily with a meal.   furosemide 20 MG tablet Commonly known as: Lasix Take 1 tablet (20 mg total) by mouth daily as needed.   isosorbide mononitrate 30 MG 24 hr tablet Commonly  known as: IMDUR Take 0.5 tablets (15 mg total) by mouth daily.   mirtazapine 7.5 MG tablet Commonly known as: REMERON Take 7.5 mg by mouth at bedtime.   OMEGA 3 PO Take 1 capsule by mouth daily.   OVER THE COUNTER MEDICATION Take 1 Dose by mouth daily. [place] Tail        Follow-up Information     Clinic, [facility] Schedule an appointment as soon as possible for a visit in 1 week(s).   Contact  information: [address] [facility] [place] [address] [phone]                Discharge Exam: Filed Weights   05/28/24 0030 05/29/24 0600 05/30/24 0500  Weight: 68 kg 69.3 kg 69.2 kg   Constitutional: NAD, calm, comfortable Eyes: PERRL, lids and conjunctivae normal ENMT: Mucous membranes are moist. Posterior pharynx clear of any exudate or lesions.Normal dentition.  Neck: normal, supple, no masses, no thyromegaly Respiratory: clear to auscultation bilaterally, no wheezing, no crackles. Normal respiratory effort. No accessory muscle use.  Cardiovascular: Regular rate and rhythm, no murmurs / rubs / gallops. No extremity edema. 2+ pedal pulses. No carotid bruits.  Abdomen: no tenderness, no masses palpated. No hepatosplenomegaly. Bowel sounds positive.  Musculoskeletal: no clubbing / cyanosis. No joint deformity upper and lower extremities. Good ROM, no contractures. Normal muscle tone.  Skin: no rashes, lesions, ulcers. No induration Neurologic: CN 2-12 grossly intact. Sensation intact, DTR normal. Strength 5/5 x all 4 extremities.  Psychiatric: Normal judgment and insight. Alert and oriented x 3. Normal mood.    Condition at discharge: good  The results of significant diagnostics from this hospitalization (including imaging, microbiology, ancillary and laboratory) are listed below for reference.   Imaging Studies: ECHOCARDIOGRAM COMPLETE Result Date: 05/28/2024    ECHOCARDIOGRAM REPORT   Patient Name:   Joseph Mayo Date of Exam: 05/28/2024 Medical Rec #:  [id number]          Height:       69.0 in Accession #:    [id number]         Weight:       150.0 lb Date of Birth:  10-Feb-1946           BSA:          1.828 m Patient Age:    78 years           BP:           96/66 mmHg Patient Gender: M                  HR:           69 bpm. Exam Location:  Inpatient Procedure: 2D Echo, Cardiac Doppler, Color Doppler and Intracardiac            Opacification Agent (Both  Spectral and Color Flow Doppler were            utilized during procedure). Indications:    CHF I50.31  History:        Patient has prior history of Echocardiogram examinations, most                 recent 07/21/2020. CHF, Arrythmias:Atrial Fibrillation;                 Signs/Symptoms:Shortness of Breath and Edema. H/O Metastatic                 prostate cancer.  Sonographer:    ALICIA STARKE  Referring Phys: [id number] JUSTIN B HOWERTER IMPRESSIONS  1. Left ventricular ejection fraction, by estimation, is 30 to 35%. The left ventricle has moderately decreased function. The left ventricle demonstrates regional wall motion abnormalities (see scoring diagram/findings for description). The left ventricular internal cavity size was moderately to severely dilated. Left ventricular diastolic parameters are consistent with Grade II diastolic dysfunction (pseudonormalization). There is anterior and septal wall akinesis with hypokinesis elsewhere.  2. Right ventricular systolic function is normal. The right ventricular size is normal.  3. Left atrial size was moderately dilated.  4. The mitral valve is normal in structure. Trivial mitral valve regurgitation.  5. The aortic valve is tricuspid. Aortic valve regurgitation is not visualized. No aortic stenosis is present. FINDINGS  Left Ventricle: Left ventricular ejection fraction, by estimation, is 30 to 35%. The left ventricle has moderately decreased function. The left ventricle demonstrates regional wall motion abnormalities. Definity contrast agent was given IV to delineate the left ventricular endocardial borders. The left ventricular internal cavity size was moderately to severely dilated. There is no left ventricular hypertrophy. Left ventricular diastolic parameters are consistent with Grade II diastolic dysfunction (pseudonormalization).  LV Wall Scoring: The entire anterior wall, entire septum, and apex are akinetic. The antero-lateral wall, mid and distal lateral wall,  and entire inferior wall are hypokinetic. The basal inferolateral segment is normal. There is anterior and septal wall akinesis with hypokinesis elsewhere. Right Ventricle: The right ventricular size is normal. No increase in right ventricular wall thickness. Right ventricular systolic function is normal. Left Atrium: Left atrial size was moderately dilated. Right Atrium: Right atrial size was normal in size. Pericardium: There is no evidence of pericardial effusion. Mitral Valve: The mitral valve is normal in structure. Trivial mitral valve regurgitation. MV peak gradient, 2.1 mmHg. The mean mitral valve gradient is 1.0 mmHg. Tricuspid Valve: The tricuspid valve is normal in structure. Tricuspid valve regurgitation is trivial. Aortic Valve: The aortic valve is tricuspid. Aortic valve regurgitation is not visualized. No aortic stenosis is present. Aortic valve mean gradient measures 3.0 mmHg. Aortic valve peak gradient measures 6.0 mmHg. Aortic valve area, by VTI measures 2.86 cm. Pulmonic Valve: The pulmonic valve was not well visualized. Pulmonic valve regurgitation is not visualized. Aorta: The aortic root and ascending aorta are structurally normal, with no evidence of dilitation. IAS/Shunts: No atrial level shunt detected by color flow Doppler.  LEFT VENTRICLE PLAX 2D LVIDd:         5.50 cm      Diastology LVIDs:         4.40 cm      LV e' medial:    6.53 cm/s LV PW:         0.80 cm      LV E/e' medial:  11.5 LV IVS:        0.90 cm      LV e' lateral:   6.74 cm/s LVOT diam:     2.10 cm      LV E/e' lateral: 11.2 LV SV:         55 LV SV Index:   30 LVOT Area:     3.46 cm  LV Volumes (MOD) LV vol d, MOD A2C: 226.0 ml LV vol d, MOD A4C: 181.0 ml LV vol s, MOD A2C: 159.0 ml LV vol s, MOD A4C: 113.0 ml LV SV MOD A2C:     67.0 ml LV SV MOD A4C:     181.0 ml LV SV MOD BP:  72.9 ml RIGHT VENTRICLE             IVC RV Basal diam:  3.10 cm     IVC diam: 1.90 cm RV S prime:     12.40 cm/s TAPSE (M-mode): 2.3 cm LEFT  ATRIUM             Index        RIGHT ATRIUM           Index LA diam:        4.50 cm 2.46 cm/m   RA Area:     17.30 cm LA Vol (A2C):   68.7 ml 37.58 ml/m  RA Volume:   49.70 ml  27.18 ml/m LA Vol (A4C):   87.2 ml 47.70 ml/m LA Biplane Vol: 81.4 ml 44.52 ml/m  AORTIC VALVE                    PULMONIC VALVE AV Area (Vmax):    2.45 cm     PV Vmax:          0.68 m/s AV Area (Vmean):   2.62 cm     PV Peak grad:     1.9 mmHg AV Area (VTI):     2.86 cm     PR End Diast Vel: 1.54 msec AV Vmax:           122.00 cm/s AV Vmean:          72.900 cm/s AV VTI:            0.194 m AV Peak Grad:      6.0 mmHg AV Mean Grad:      3.0 mmHg LVOT Vmax:         86.20 cm/s LVOT Vmean:        55.100 cm/s LVOT VTI:          0.160 m LVOT/AV VTI ratio: 0.82  AORTA Ao Root diam: 3.50 cm Ao Asc diam:  3.80 cm MITRAL VALVE MV Area (PHT): 3.23 cm    SHUNTS MV Area VTI:   2.50 cm    Systemic VTI:  0.16 m MV Peak grad:  2.1 mmHg    Systemic Diam: 2.10 cm MV Mean grad:  1.0 mmHg MV Vmax:       0.73 m/s MV Vmean:      46.7 cm/s MV Decel Time: 235 msec MR Peak grad: 32.3 mmHg MR Vmax:      284.00 cm/s MV E velocity: 75.40 cm/s MV A velocity: 53.10 cm/s MV E/A ratio:  1.42 Franck Azobou Tonleu Electronically signed by Joelle Cedars Tonleu Signature Date/Time: 05/28/2024/1:22:52 PM    Final    DG Chest Portable 1 View Result Date: 05/28/2024 CLINICAL DATA:  Hypoxia., . Pt states that he woke up and started having issues breathing. EXAM: PORTABLE CHEST 1 VIEW COMPARISON:  Chest x-ray 07/23/2020 FINDINGS: The heart and mediastinal contours are within normal limits. Diffuse patchy airspace and interstitial opacities. No pleural effusion. No pneumothorax. No acute osseous abnormality. IMPRESSION: Diffuse patchy airspace and interstitial opacities. Electronically Signed   By: Morgane  Naveau M.D.   On: 05/28/2024 01:24    Microbiology: Results for orders placed or performed during the hospital encounter of 05/28/24  Resp panel by RT-PCR (RSV,  Flu A&B, Covid) Anterior Nasal Swab     Status: None   Collection Time: 05/28/24  1:41 AM   Specimen: Anterior Nasal Swab  Result Value Ref Range Status   SARS Coronavirus 2 by  RT PCR NEGATIVE NEGATIVE Final   Influenza A by PCR NEGATIVE NEGATIVE Final   Influenza B by PCR NEGATIVE NEGATIVE Final    Comment: (NOTE) The Xpert Xpress SARS-CoV-2/FLU/RSV plus assay is intended as an aid in the diagnosis of influenza from Nasopharyngeal swab specimens and should not be used as a sole basis for treatment. Nasal washings and aspirates are unacceptable for Xpert Xpress SARS-CoV-2/FLU/RSV testing.  Fact Sheet for Patients: BloggerCourse.com  Fact Sheet for Healthcare Providers: SeriousBroker.it  This test is not yet approved or cleared by the [place]  FDA and has been authorized for detection and/or diagnosis of SARS-CoV-2 by FDA under an Emergency Use Authorization (EUA). This EUA will remain in effect (meaning this test can be used) for the duration of the COVID-19 declaration under Section 564(b)(1) of the Act, 21 U.S.C. section 360bbb-3(b)(1), unless the authorization is terminated or revoked.     Resp Syncytial Virus by PCR NEGATIVE NEGATIVE Final    Comment: (NOTE) Fact Sheet for Patients: BloggerCourse.com  Fact Sheet for Healthcare Providers: SeriousBroker.it  This test is not yet approved or cleared by the [place]  FDA and has been authorized for detection and/or diagnosis of SARS-CoV-2 by FDA under an Emergency Use Authorization (EUA). This EUA will remain in effect (meaning this test can be used) for the duration of the COVID-19 declaration under Section 564(b)(1) of the Act, 21 U.S.C. section 360bbb-3(b)(1), unless the authorization is terminated or revoked.  Performed at [facility], [address], [place], [place] [address]     Labs: CBC: Recent  Labs  Lab 05/28/24 0049 05/28/24 0051 05/28/24 0639 05/29/24 0406 05/30/24 0308  WBC 15.7*  --  7.6 6.5 5.6  NEUTROABS 5.4  --  5.3  --   --   HGB 8.7* 8.8* 8.3* 7.3* 7.9*  HCT 28.4* 26.0* 25.8* 23.5* 24.7*  MCV 100.0  --  97.0 98.7 96.5  PLT 182  --  139* 130* 142*   Basic Metabolic Panel: Recent Labs  Lab 05/28/24 0049 05/28/24 0051 05/28/24 0339 05/28/24 0639 05/28/24 1204 05/29/24 0406 05/30/24 0308  NA 136 139  --  136  --  139 140  K 4.1 4.2  --  5.4* 4.6 3.8 3.8  CL 104 106  --  105  --  104 104  CO2 22  --   --  21*  --  24 24  GLUCOSE 268* 262*  --  132*  --  107* 87  BUN 18 19  --  20  --  25* 27*  CREATININE 1.80* 1.80*  --  1.81*  --  1.98* 2.04*  CALCIUM 8.2*  --   --  8.2*  --  7.2* 7.7*  MG  --   --  2.1 2.0  --  1.8  --   PHOS  --   --   --  3.4  --   --   --    Liver Function Tests: Recent Labs  Lab 05/28/24 0049 05/28/24 0639  AST 25 29  ALT 12 10  ALKPHOS 3,626* 3,174*  BILITOT 0.8 1.0  PROT 6.1* 5.8*  ALBUMIN 2.8* 2.7*   CBG: Recent Labs  Lab 05/29/24 0659 05/29/24 1236 05/29/24 1531 05/29/24 2114 05/30/24 0616  GLUCAP 104* 86 124* 160* 88    Discharge time spent: greater than 30 minutes.  Signed: Deliliah Room, MD [facility] 05/30/2024

## 2024-06-05 ENCOUNTER — Encounter (HOSPITAL_COMMUNITY): Payer: Self-pay | Admitting: *Deleted

## 2024-06-05 ENCOUNTER — Inpatient Hospital Stay (HOSPITAL_COMMUNITY)
Admission: EM | Admit: 2024-06-05 | Discharge: 2024-06-10 | DRG: 640 | Disposition: A | Discharge status: Home Health | Attending: Family Medicine | Admitting: Family Medicine

## 2024-06-05 ENCOUNTER — Other Ambulatory Visit: Payer: Self-pay

## 2024-06-05 DIAGNOSIS — N1832 Chronic kidney disease, stage 3b: Secondary | ICD-10-CM | POA: Diagnosis present

## 2024-06-05 DIAGNOSIS — E790 Hyperuricemia without signs of inflammatory arthritis and tophaceous disease: Secondary | ICD-10-CM | POA: Diagnosis present

## 2024-06-05 DIAGNOSIS — Z79899 Other long term (current) drug therapy: Secondary | ICD-10-CM

## 2024-06-05 DIAGNOSIS — D696 Thrombocytopenia, unspecified: Secondary | ICD-10-CM | POA: Diagnosis present

## 2024-06-05 DIAGNOSIS — D649 Anemia, unspecified: Secondary | ICD-10-CM | POA: Diagnosis present

## 2024-06-05 DIAGNOSIS — C61 Malignant neoplasm of prostate: Secondary | ICD-10-CM | POA: Diagnosis present

## 2024-06-05 DIAGNOSIS — E883 Tumor lysis syndrome: Secondary | ICD-10-CM | POA: Diagnosis present

## 2024-06-05 DIAGNOSIS — I5043 Acute on chronic combined systolic (congestive) and diastolic (congestive) heart failure: Secondary | ICD-10-CM | POA: Diagnosis not present

## 2024-06-05 DIAGNOSIS — Z9181 History of falling: Secondary | ICD-10-CM

## 2024-06-05 DIAGNOSIS — Z8619 Personal history of other infectious and parasitic diseases: Secondary | ICD-10-CM

## 2024-06-05 DIAGNOSIS — I480 Paroxysmal atrial fibrillation: Secondary | ICD-10-CM | POA: Diagnosis present

## 2024-06-05 DIAGNOSIS — Z8546 Personal history of malignant neoplasm of prostate: Secondary | ICD-10-CM

## 2024-06-05 DIAGNOSIS — Z66 Do not resuscitate: Secondary | ICD-10-CM | POA: Diagnosis present

## 2024-06-05 DIAGNOSIS — I130 Hypertensive heart and chronic kidney disease with heart failure and stage 1 through stage 4 chronic kidney disease, or unspecified chronic kidney disease: Secondary | ICD-10-CM | POA: Diagnosis present

## 2024-06-05 DIAGNOSIS — D631 Anemia in chronic kidney disease: Secondary | ICD-10-CM | POA: Diagnosis present

## 2024-06-05 DIAGNOSIS — N179 Acute kidney failure, unspecified: Principal | ICD-10-CM | POA: Diagnosis present

## 2024-06-05 DIAGNOSIS — Z59868 Other specified financial insecurity: Secondary | ICD-10-CM

## 2024-06-05 DIAGNOSIS — I10 Essential (primary) hypertension: Secondary | ICD-10-CM

## 2024-06-05 DIAGNOSIS — N131 Hydronephrosis with ureteral stricture, not elsewhere classified: Secondary | ICD-10-CM | POA: Diagnosis present

## 2024-06-05 DIAGNOSIS — C7951 Secondary malignant neoplasm of bone: Secondary | ICD-10-CM | POA: Diagnosis present

## 2024-06-05 DIAGNOSIS — G909 Disorder of the autonomic nervous system, unspecified: Secondary | ICD-10-CM | POA: Diagnosis present

## 2024-06-05 DIAGNOSIS — I5042 Chronic combined systolic (congestive) and diastolic (congestive) heart failure: Secondary | ICD-10-CM | POA: Diagnosis present

## 2024-06-05 DIAGNOSIS — N132 Hydronephrosis with renal and ureteral calculous obstruction: Secondary | ICD-10-CM | POA: Diagnosis present

## 2024-06-05 DIAGNOSIS — E875 Hyperkalemia: Principal | ICD-10-CM | POA: Diagnosis present

## 2024-06-05 DIAGNOSIS — D638 Anemia in other chronic diseases classified elsewhere: Secondary | ICD-10-CM

## 2024-06-05 HISTORY — DX: Heart failure, unspecified: I50.9

## 2024-06-05 LAB — COMPREHENSIVE METABOLIC PANEL WITH GFR
ALT: 19 U/L (ref 0–44)
AST: 27 U/L (ref 15–41)
Albumin: 3.3 g/dL — ABNORMAL LOW (ref 3.5–5.0)
Alkaline Phosphatase: 3465 U/L — ABNORMAL HIGH (ref 38–126)
Anion gap: 8 (ref 5–15)
BUN: 31 mg/dL — ABNORMAL HIGH (ref 8–23)
CO2: 22 mmol/L (ref 22–32)
Calcium: 8.6 mg/dL — ABNORMAL LOW (ref 8.9–10.3)
Chloride: 104 mmol/L (ref 98–111)
Creatinine, Ser: 2.12 mg/dL — ABNORMAL HIGH (ref 0.61–1.24)
GFR, Estimated: 31 mL/min — ABNORMAL LOW (ref >60)
Glucose, Bld: 113 mg/dL — ABNORMAL HIGH (ref 70–99)
Potassium: 6.3 mmol/L (ref 3.5–5.1)
Sodium: 134 mmol/L — ABNORMAL LOW (ref 135–145)
Total Bilirubin: 1.5 mg/dL — ABNORMAL HIGH (ref 0.0–1.2)
Total Protein: 6.8 g/dL (ref 6.5–8.1)

## 2024-06-05 LAB — URINALYSIS, ROUTINE W REFLEX MICROSCOPIC
Bacteria, UA: NONE SEEN
Bilirubin Urine: NEGATIVE
Glucose, UA: NEGATIVE mg/dL
Hgb urine dipstick: NEGATIVE
Ketones, ur: NEGATIVE mg/dL
Leukocytes,Ua: NEGATIVE
Nitrite: NEGATIVE
Protein, ur: 100 mg/dL — AB
Specific Gravity, Urine: 1.019 (ref 1.005–1.030)
pH: 8 (ref 5.0–8.0)

## 2024-06-05 LAB — SAMPLE TO BLOOD BANK

## 2024-06-05 LAB — CBC
HCT: 25 % — ABNORMAL LOW (ref 39.0–52.0)
Hemoglobin: 7.8 g/dL — ABNORMAL LOW (ref 13.0–17.0)
MCH: 31.3 pg (ref 26.0–34.0)
MCHC: 31.2 g/dL (ref 30.0–36.0)
MCV: 100.4 fL — ABNORMAL HIGH (ref 80.0–100.0)
Platelets: 144 K/uL — ABNORMAL LOW (ref 150–400)
RBC: 2.49 MIL/uL — ABNORMAL LOW (ref 4.22–5.81)
RDW: 17.6 % — ABNORMAL HIGH (ref 11.5–15.5)
WBC: 7.5 K/uL (ref 4.0–10.5)
nRBC: 0.3 % — ABNORMAL HIGH (ref 0.0–0.2)

## 2024-06-05 LAB — LIPASE, BLOOD: Lipase: 28 U/L (ref 11–51)

## 2024-06-05 MED ORDER — ACETAMINOPHEN 325 MG PO TABS
650.0000 mg | ORAL_TABLET | Freq: Four times a day (QID) | ORAL | Status: DC | PRN
  Filled 2024-06-05: qty 2

## 2024-06-05 MED ORDER — CALCIUM CARBONATE ANTACID 500 MG PO CHEW
1000.0000 mg | CHEWABLE_TABLET | Freq: Two times a day (BID) | ORAL | Status: DC
  Administered 2024-06-06 – 2024-06-10 (×9): 400 mg via ORAL
  Filled 2024-06-05 (×10): qty 2

## 2024-06-05 MED ORDER — ONDANSETRON HCL 4 MG PO TABS: 4.0000 mg | ORAL_TABLET | Freq: Four times a day (QID) | ORAL | Status: DC | PRN

## 2024-06-05 MED ORDER — DEXTROSE 50 % IV SOLN
1.0000 | Freq: Once | INTRAVENOUS | Status: AC
  Administered 2024-06-05: 50 mL via INTRAVENOUS
  Filled 2024-06-05: qty 50

## 2024-06-05 MED ORDER — DAROLUTAMIDE 300 MG PO TABS: 600.0000 mg | ORAL_TABLET | Freq: Two times a day (BID) | ORAL | Status: DC

## 2024-06-05 MED ORDER — SENNOSIDES-DOCUSATE SODIUM 8.6-50 MG PO TABS: 1.0000 | ORAL_TABLET | Freq: Every evening | ORAL | Status: DC | PRN

## 2024-06-05 MED ORDER — CALCITRIOL 0.25 MCG PO CAPS
0.2500 ug | ORAL_CAPSULE | Freq: Every day | ORAL | Status: DC
Start: 2024-06-06 — End: 2024-06-10
  Administered 2024-06-06 – 2024-06-10 (×5): 0.25 ug via ORAL
  Filled 2024-06-05 (×5): qty 1

## 2024-06-05 MED ORDER — OMEGA-3-ACID ETHYL ESTERS 1 G PO CAPS
1000.0000 mg | ORAL_CAPSULE | Freq: Every day | ORAL | Status: DC
  Administered 2024-06-06 – 2024-06-10 (×5): 1000 mg via ORAL
  Filled 2024-06-05 (×5): qty 1

## 2024-06-05 MED ORDER — FUROSEMIDE 10 MG/ML IJ SOLN
40.0000 mg | Freq: Every day | INTRAMUSCULAR | Status: DC
Start: 2024-06-06 — End: 2024-06-06
  Administered 2024-06-06: 40 mg via INTRAVENOUS
  Filled 2024-06-05: qty 4

## 2024-06-05 MED ORDER — CARVEDILOL 6.25 MG PO TABS
6.2500 mg | ORAL_TABLET | Freq: Two times a day (BID) | ORAL | Status: DC
  Administered 2024-06-06 – 2024-06-07 (×4): 6.25 mg via ORAL
  Filled 2024-06-05 (×4): qty 1

## 2024-06-05 MED ORDER — INSULIN ASPART 100 UNIT/ML IV SOLN
5.0000 [IU] | Freq: Once | INTRAVENOUS | Status: AC
  Administered 2024-06-05: 5 [IU] via INTRAVENOUS

## 2024-06-05 MED ORDER — FUROSEMIDE 10 MG/ML IJ SOLN
80.0000 mg | Freq: Once | INTRAMUSCULAR | Status: AC
  Administered 2024-06-05: 80 mg via INTRAVENOUS
  Filled 2024-06-05: qty 8

## 2024-06-05 MED ORDER — SODIUM ZIRCONIUM CYCLOSILICATE 10 G PO PACK
10.0000 g | PACK | Freq: Once | ORAL | Status: AC
  Administered 2024-06-05: 10 g via ORAL
  Filled 2024-06-05: qty 1

## 2024-06-05 MED ORDER — CALCIUM GLUCONATE-NACL 1-0.675 GM/50ML-% IV SOLN
1.0000 g | Freq: Once | INTRAVENOUS | Status: AC
  Administered 2024-06-05: 1000 mg via INTRAVENOUS
  Filled 2024-06-05: qty 50

## 2024-06-05 MED ORDER — BISACODYL 5 MG PO TBEC: 5.0000 mg | DELAYED_RELEASE_TABLET | Freq: Every day | ORAL | Status: DC | PRN

## 2024-06-05 MED ORDER — ONDANSETRON HCL 4 MG/2ML IJ SOLN: 4.0000 mg | Freq: Four times a day (QID) | INTRAMUSCULAR | Status: DC | PRN

## 2024-06-05 MED ORDER — ISOSORBIDE MONONITRATE ER 30 MG PO TB24
15.0000 mg | ORAL_TABLET | Freq: Every day | ORAL | Status: DC
  Administered 2024-06-06: 15 mg via ORAL
  Filled 2024-06-05: qty 1

## 2024-06-05 MED ORDER — ACETAMINOPHEN 650 MG RE SUPP: 650.0000 mg | Freq: Four times a day (QID) | RECTAL | Status: DC | PRN

## 2024-06-05 MED ORDER — VITAMIN B-12 1000 MCG PO TABS
1000.0000 ug | ORAL_TABLET | Freq: Every day | ORAL | Status: DC
  Administered 2024-06-06 – 2024-06-10 (×5): 1000 ug via ORAL
  Filled 2024-06-05 (×5): qty 1

## 2024-06-05 NOTE — ED Triage Notes (Signed)
 Pt came in from the [place] d/t being informed that his Potassium was high & Hgb was low.

## 2024-06-05 NOTE — ED Provider Triage Note (Signed)
 Emergency Medicine Provider Triage Evaluation Note  ORDELL PRICHETT , a 78 y.o. male  was evaluated in triage.  Pt complains of abnormal labs from [place].  Review of Systems  Positive: asymptomatic Negative: asymptomatic  Physical Exam  BP (!) 98/57 (BP Location: Left Arm)   Pulse 61   Temp 97.8 F (36.6 C) (Oral)   Resp 14   Ht 5' 9 (1.753 m)   Wt 69.2 kg   SpO2 98%   BMI 22.53 kg/m  Gen:   Awake, no distress   Resp:  Normal effort  MSK:   Moves extremities without difficulty  Other:    Medical Decision Making  Medically screening exam initiated at 7:05 PM.  Appropriate orders placed.  Sherwood FORBES Gaines was informed that the remainder of the evaluation will be completed by another provider, this initial triage assessment does not replace that evaluation, and the importance of remaining in the ED until their evaluation is complete.  Patient seen at the [facility] today by oncology (new prostate CA) and found to have anemia and hyperkalemia. History of thrombocytopenia. Recent hospitalizations for pulmonary edema and recent fall with ?hip fracture, found to have abnormal kidney function, currently with ureteral stent in place. The patient denies symptoms.    Odell Balls, PA-C 06/05/24 8091

## 2024-06-05 NOTE — H&P (Signed)
 History and Physical  Joseph Mayo FMW:[id number] DOB: [birth year] DOA: 06/05/2024  PCP: [facility]   Chief Complaint: Abnormal lab  HPI: Joseph Mayo is a 78 y.o. 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 Per daughter, patient has been doing well since discharge from the hospital last week. Patient has not required a as needed Lasix since discharge but he has had increased leg swelling today. Patient reports feeling weak but this is unchanged from his baseline and denies any shortness of breath, chest pain, nausea, vomiting, abdominal pain, fever, chills, cough, numbness or tingling.  Patient had blood work done at his [place] oncologist office today and related to to present to the ED due to high potassium levels.  ED Course: Initial vitals show patient afebrile, RR 12-19, HR 60s, SBP 90-120s, SpO2 98% on room air. Initial labs significant for K+ 6.3, sodium 134, BUNs/creatinine 31/2.12, alkaline phosphate 3465, albumin 3.3, bilirubin 1.5, WBC 7.5, Hgb 7.8, platelet 144, UA with no signs of infection. CXR shows diffuse patchy airspace and interstitial opacities. Pt received  IV calcium gluconate 1 g, IV Lasix 80 mg x 1, NovoLog 5 units with 1 amp of D50 and Lokelma 10 g. TR[facility] was consulted for admission.   Review of Systems: Please see HPI for pertinent positives and negatives. A complete 10 system review of systems are otherwise negative.  Past Medical History:  Diagnosis Date   Acute on chronic respiratory failure with hypoxia (HCC)    Autonomic dysfunction    CHF (congestive heart failure) (HCC)    Chronic systolic heart failure (HCC)    Guillain Barr syndrome    2021   Paroxysmal atrial  fibrillation (HCC)    Prostate cancer metastatic to multiple sites [facility])    Severe sepsis (HCC)    No past surgical history on file. Social History:  reports that he has never smoked. He has never used smokeless tobacco. He reports that he does not drink alcohol and does not use drugs.  No Known Allergies  No family history on file.   Prior to Admission medications   Medication Sig Start Date End Date Taking? Authorizing Provider  atorvastatin (LIPITOR) 40 MG tablet Take 1 tablet (40 mg total) by mouth daily. 05/31/24   Rashid, Farhan, MD  calcitRIOL (ROCALTROL) 0.25 MCG capsule Take 0.25 mcg by mouth daily. 05/08/24 06/07/24  [provider]  calcium carbonate (TUMS - DOSED IN MG ELEMENTAL CALCIUM) 500 MG chewable tablet Chew 1,000 mg by mouth 2 (two) times daily. 05/07/24 06/06/24  [provider]  carvedilol (COREG) 6.25 MG tablet Take 1 tablet (6.25 mg total) by mouth 2 (two) times daily with a meal. 05/30/24   Rashid, Farhan, MD  Cyanocobalamin (B-12 PO) Take 1 tablet by mouth daily.    [provider]  furosemide (LASIX) 20 MG tablet Take 1 tablet (20 mg total) by mouth daily as needed. 05/30/24 05/30/25  Rashid, Farhan, MD  isosorbide mononitrate (IMDUR) 30 MG 24 hr tablet Take 0.5 tablets (15 mg total) by mouth daily. 05/30/24   Rashid, Farhan, MD  mirtazapine (REMERON) 7.5 MG tablet Take 7.5 mg by mouth at bedtime. 05/07/24 06/06/24  [provider]  Omega-3 Fatty Acids (OMEGA 3 PO) Take 1 capsule by mouth  daily.    [provider]  OVER THE COUNTER MEDICATION Take 1 Dose by mouth daily. [place] IT trainer, Historical, MD    Physical Exam: BP 119/64   Pulse 68   Temp 97.8 F (36.6 C) (Oral)   Resp 19   Ht 5' 9 (1.753 m)   Wt 69.2 kg   SpO2 97%   BMI 22.53 kg/m  General: Pleasant, chronically ill and weak appearing elderly man laying in bed. No acute distress. HEENT: [place]/AT. Anicteric sclera. CV: RRR. No murmurs, rubs, or  gallops.  1+ BLE edema Pulmonary: Lungs CTAB. Normal effort. Distant rales Abdominal: Soft, nontender, nondistended. Normal bowel sounds. Extremities: Palpable radial and DP pulses. Normal ROM. Skin: Warm and dry. No obvious rash or lesions. Neuro: A&Ox3. Moves all extremities. Normal sensation to light touch. No focal deficit. Psych: Slightly blunted affect.           Labs on Admission:  Basic Metabolic Panel: Recent Labs  Lab 05/30/24 0308 06/05/24 1834  NA 140 134*  K 3.8 6.3*  CL 104 104  CO2 24 22  GLUCOSE 87 113*  BUN 27* 31*  CREATININE 2.04* 2.12*  CALCIUM 7.7* 8.6*   Liver Function Tests: Recent Labs  Lab 06/05/24 1834  AST 27  ALT 19  ALKPHOS 3,465*  BILITOT 1.5*  PROT 6.8  ALBUMIN 3.3*   Recent Labs  Lab 06/05/24 1834  LIPASE 28   No results for input(s): AMMONIA in the last 168 hours. CBC: Recent Labs  Lab 05/30/24 0308 06/05/24 1834  WBC 5.6 7.5  HGB 7.9* 7.8*  HCT 24.7* 25.0*  MCV 96.5 100.4*  PLT 142* 144*   Cardiac Enzymes: No results for input(s): CKTOTAL, CKMB, CKMBINDEX, TROPONINI in the last 168 hours. BNP (last 3 results) Recent Labs    05/28/24 0049  BNP 1,135.3*    ProBNP (last 3 results) No results for input(s): PROBNP in the last 8760 hours.  CBG: Recent Labs  Lab 05/30/24 0616  GLUCAP 88   My independent interpretation of EKG: Normal sinus with normal T wave morphology, normal PR and QT intervals.  Radiological Exams on Admission: No results found. Assessment/Plan Joseph Mayo is a 78 y.o. 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   # Hyperkalemia - K+ elevated to 6.3 on admission - No  significant EKG changes, patient not on any potassium sparing diuretics - Likely secondary to worsening kidney disease - S/p IV calcium gluconate 1 g, IV Lasix 80 mg x 1, NovoLog 5 units and 1 amp of D50 and Lokelma 10 g in the ED - Trend potassium - Telemetry  # AKI on CKD 3B - Creatinine of 2.12 slightly elevated compared to baseline of 1.7-2.0 - Likely in the setting of mild hypervolemia - Continue IV Lasix - Continue Calcitrol - Trend renal function, avoid nephrotoxic agents  # Acute on chronic combined systolic and diastolic HF # Suspected Takotsubo cardiomyopathy - Last TTE on 05/28/2024 shows EF 30-35%, G2DD, - Pt presented with worsening lower extremity edema - Pt with radiological and clinical signs of mild CHF exacerbation - Acute CHF likely 2/2 under diuresing at home, on as needed Lasix has not taken any since discharge from the  hospital last week - Start IV lasix 40 mg daily - Continue Coreg and Imdur - Strict I&O, daily weights - Maintain K+ > 4.0, Mag > 2.0 - Telemetry  # Anemia of chronic disease - Hgb of 7.8 not significantly different from baseline of 7-9 - No signs of active bleeding or bruising -Trend CBC and transfuse for Hgb > 7.0  # HTN - BP stable with SBP in the 90s-110s - Per med rec, patient has been taking 30 mg of Imdur instead of the prescribed 15 mg - Continue Coreg and reduce Imdur back to 15 mg daily  # Metastatic prostate cancer # Bone metastases - [facility] oncology at [facility] - Per daughter, patient recently started on darolutamide, resume and reduce dose if GFR drops below 30 - Per med rec, patient has also started taking multiple OTC supplements including turmeric, saw palmetto and [place] tail  # Elevated alkaline phosphate - Secondary to bone mets  DVT prophylaxis: SCDs    Code Status: Limited: Do not attempt resuscitation (DNR) -DNR-LIMITED -Do Not Intubate/DNI   Consults called: None  Family Communication: Discussed  admission with daughter at bedside  Severity of Illness: The appropriate patient status for this patient is OBSERVATION. Observation status is judged to be reasonable and necessary in order to provide the required intensity of service to ensure the patient's safety. The patient's presenting symptoms, physical exam findings, and initial radiographic and laboratory data in the context of their medical condition is felt to place them at decreased risk for further clinical deterioration. Furthermore, it is anticipated that the patient will be medically stable for discharge from the hospital within 2 midnights of admission.   Level of care: Telemetry Medical    Joseph Claretta HERO, MD 06/05/2024, 10:25 PM [facility] Pager: [phone] Isaiah 41:10   If 7PM-7AM, please contact night-coverage [email or web address] Password [email or web address]

## 2024-06-05 NOTE — ED Provider Notes (Signed)
 [place] EMERGENCY DEPARTMENT AT [facility] Provider Note  CSN: [id number] Arrival date & time: 06/05/24 1642  Chief Complaint(s) Abnormal Labs  HPI Joseph Mayo is a 78 y.o. male history of CHF, prostate cancer presenting to the emergency department with abnormal lab.  Patient follows at the [place], got called today that his potassium was elevated and he should come to the ER.  This was obtained on routine labs.  He overall feels fine.  He has been receiving some treatment for prostate cancer but does not know what it was.  No lightheadedness or dizziness, fainting, diaphoresis, chest pain, shortness of breath.  Has been having some leg swelling which is not significantly different than recent.   Past Medical History Past Medical History:  Diagnosis Date   Acute on chronic respiratory failure with hypoxia (HCC)    Autonomic dysfunction    CHF (congestive heart failure) (HCC)    Chronic systolic heart failure (HCC)    Guillain Barr syndrome    2021   Paroxysmal atrial fibrillation (HCC)    Prostate cancer metastatic to multiple sites [facility])    Severe sepsis [facility])    Patient Active Problem List   Diagnosis Date Noted   Hyperkalemia 06/05/2024   Flash pulmonary edema (HCC) 05/28/2024   SOB (shortness of breath) 05/28/2024   Acute on chronic combined systolic and diastolic CHF (congestive heart failure) (HCC) 05/28/2024   Elevated troponin 05/28/2024   Alkaline phosphatase elevation 05/28/2024   Hyperglycemia 05/28/2024   Leukocytosis 05/28/2024   CKD stage 3b, GFR 30-44 ml/min (HCC) 05/28/2024   Anemia of chronic disease 05/28/2024   Acute respiratory failure with hypoxia (HCC)    Guillain Barr syndrome    Paroxysmal atrial fibrillation (HCC)    Autonomic dysfunction    Severe sepsis (HCC)    Home Medication(s) Prior to Admission medications   Medication Sig Start Date End Date Taking? Authorizing Provider  atorvastatin (LIPITOR) 40 MG tablet Take 1 tablet  (40 mg total) by mouth daily. 05/31/24   Rashid, Farhan, MD  calcitRIOL (ROCALTROL) 0.25 MCG capsule Take 0.25 mcg by mouth daily. 05/08/24 06/07/24  [provider]  calcium carbonate (TUMS - DOSED IN MG ELEMENTAL CALCIUM) 500 MG chewable tablet Chew 1,000 mg by mouth 2 (two) times daily. 05/07/24 06/06/24  [provider]  carvedilol (COREG) 6.25 MG tablet Take 1 tablet (6.25 mg total) by mouth 2 (two) times daily with a meal. 05/30/24   Rashid, Farhan, MD  Cyanocobalamin (B-12 PO) Take 1 tablet by mouth daily.    [provider]  furosemide (LASIX) 20 MG tablet Take 1 tablet (20 mg total) by mouth daily as needed. 05/30/24 05/30/25  Rashid, Farhan, MD  isosorbide mononitrate (IMDUR) 30 MG 24 hr tablet Take 0.5 tablets (15 mg total) by mouth daily. 05/30/24   Rashid, Farhan, MD  mirtazapine (REMERON) 7.5 MG tablet Take 7.5 mg by mouth at bedtime. 05/07/24 06/06/24  [provider]  Omega-3 Fatty Acids (OMEGA 3 PO) Take 1 capsule by mouth daily.    [provider]  OVER THE COUNTER MEDICATION Take 1 Dose by mouth daily. [place] IT trainer, Historical, MD  Past Surgical History No past surgical history on file. Family History No family history on file.  Social History Social History   Tobacco Use   Smoking status: Never   Smokeless tobacco: Never  Substance Use Topics   Alcohol use: Never   Drug use: Never   Allergies Patient has no known allergies.  Review of Systems Review of Systems  All other systems reviewed and are negative.   Physical Exam Vital Signs  I have reviewed the triage vital signs BP 119/64   Pulse 68   Temp 97.8 F (36.6 C) (Oral)   Resp 19   Ht 5' 9 (1.753 m)   Wt 69.2 kg   SpO2 97%   BMI 22.53 kg/m  Physical Exam Vitals and nursing note reviewed.  Constitutional:      General:  He is not in acute distress.    Appearance: Normal appearance.  HENT:     Mouth/Throat:     Mouth: Mucous membranes are moist.  Eyes:     Conjunctiva/sclera: Conjunctivae normal.  Cardiovascular:     Rate and Rhythm: Normal rate and regular rhythm.  Pulmonary:     Effort: Pulmonary effort is normal. No respiratory distress.     Breath sounds: Normal breath sounds.  Abdominal:     General: Abdomen is flat.     Palpations: Abdomen is soft.     Tenderness: There is no abdominal tenderness.  Musculoskeletal:     Right lower leg: Edema present.     Left lower leg: Edema present.  Skin:    General: Skin is warm and dry.     Capillary Refill: Capillary refill takes less than 2 seconds.  Neurological:     Mental Status: He is alert and oriented to person, place, and time. Mental status is at baseline.  Psychiatric:        Mood and Affect: Mood normal.        Behavior: Behavior normal.     ED Results and Treatments Labs (all labs ordered are listed, but only abnormal results are displayed) Labs Reviewed  COMPREHENSIVE METABOLIC PANEL WITH GFR - Abnormal; Notable for the following components:      Result Value   Sodium 134 (*)    Potassium 6.3 (*)    Glucose, Bld 113 (*)    BUN 31 (*)    Creatinine, Ser 2.12 (*)    Calcium 8.6 (*)    Albumin 3.3 (*)    Alkaline Phosphatase 3,465 (*)    Total Bilirubin 1.5 (*)    GFR, Estimated 31 (*)    All other components within normal limits  CBC - Abnormal; Notable for the following components:   RBC 2.49 (*)    Hemoglobin 7.8 (*)    HCT 25.0 (*)    MCV 100.4 (*)    RDW 17.6 (*)    Platelets 144 (*)    nRBC 0.3 (*)    All other components within normal limits  URINALYSIS, ROUTINE W REFLEX MICROSCOPIC - Abnormal; Notable for the following components:   Protein, ur 100 (*)    All other components within normal limits  LIPASE, BLOOD  SAMPLE TO BLOOD BANK  Radiology No results found.  Pertinent labs & imaging results that were available during my care of the patient were reviewed by me and considered in my medical decision making (see MDM for details).  Medications Ordered in ED Medications  insulin aspart (novoLOG) injection 5 Units (5 Units Intravenous Given 06/05/24 2125)    And  dextrose 50 % solution 50 mL (50 mLs Intravenous Given 06/05/24 2125)  sodium zirconium cyclosilicate (LOKELMA) packet 10 g (10 g Oral Given 06/05/24 2133)  calcium gluconate 1 g/ 50 mL sodium chloride IVPB (0 mg Intravenous Stopped 06/05/24 2157)  furosemide (LASIX) injection 80 mg (80 mg Intravenous Given 06/05/24 2128)                                                                                                                                     Procedures .Critical Care  Performed by: Francesca Elsie CROME, MD Authorized by: Francesca Elsie CROME, MD   Critical care provider statement:    Critical care time (minutes):  30   Critical care was necessary to treat or prevent imminent or life-threatening deterioration of the following conditions:  Metabolic crisis   Critical care was time spent personally by me on the following activities:  Development of treatment plan with patient or surrogate, discussions with consultants, evaluation of patient's response to treatment, examination of patient, ordering and review of laboratory studies, ordering and review of radiographic studies, ordering and performing treatments and interventions, pulse oximetry, re-evaluation of patient's condition and review of old charts   Care discussed with: admitting provider     (including critical care time)  Medical Decision Making / ED Course   MDM:  78 year old presenting to the emergency department with abnormal lab test.  Laboratory testing here does confirm hyperkalemia.  He is overall very well-appearing.  Does have some lower  extremity swelling.  Denies any symptoms from this.  EKG is without any changes concerning for hyperkalemia.  Given acute hyperkalemia, send patient will need to be admitted for further management.  Will shift with insulin and glucose, give Lokelma, Lasix as he does have some swelling to his legs and history of CHF.  Differential includes worsening renal disease, medication effect.  Discussed with Dr. Lou who has admitted patient for further management of hyperkalemia.      Additional history obtained: -Additional history obtained from family -External records from outside source obtained and reviewed including: Chart review including previous notes, labs, imaging, consultation notes including prior admission   Lab Tests: -I ordered, reviewed, and interpreted labs.   The pertinent results include:   Labs Reviewed  COMPREHENSIVE METABOLIC PANEL WITH GFR - Abnormal; Notable for the following components:      Result Value   Sodium 134 (*)    Potassium 6.3 (*)    Glucose, Bld 113 (*)    BUN 31 (*)    Creatinine, Ser 2.12 (*)    Calcium  8.6 (*)    Albumin 3.3 (*)    Alkaline Phosphatase 3,465 (*)    Total Bilirubin 1.5 (*)    GFR, Estimated 31 (*)    All other components within normal limits  CBC - Abnormal; Notable for the following components:   RBC 2.49 (*)    Hemoglobin 7.8 (*)    HCT 25.0 (*)    MCV 100.4 (*)    RDW 17.6 (*)    Platelets 144 (*)    nRBC 0.3 (*)    All other components within normal limits  URINALYSIS, ROUTINE W REFLEX MICROSCOPIC - Abnormal; Notable for the following components:   Protein, ur 100 (*)    All other components within normal limits  LIPASE, BLOOD  SAMPLE TO BLOOD BANK    Notable for hyperkalemia   EKG   EKG Interpretation Date/Time:  Wednesday June 05 2024 19:11:00 EDT Ventricular Rate:  62 PR Interval:  186 QRS Duration:  80 QT Interval:  398 QTC Calculation: 403 R Axis:   6  Text Interpretation: Normal sinus rhythm  Nonspecific T wave abnormality Abnormal ECG When compared with ECG of 29-May-2024 10:13, PREVIOUS ECG IS PRESENT Confirmed by Francesca Fallow ([id number]) on 06/05/2024 7:32:53 PM        Medicines ordered and prescription drug management: Meds ordered this encounter  Medications   AND Linked Order Group    insulin aspart (novoLOG) injection 5 Units    dextrose 50 % solution 50 mL   sodium zirconium cyclosilicate (LOKELMA) packet 10 g   calcium gluconate 1 g/ 50 mL sodium chloride IVPB   furosemide (LASIX) injection 80 mg    -I have reviewed the patients home medicines and have made adjustments as needed   Reevaluation: After the interventions noted above, I reevaluated the patient and found that their symptoms have stayed the same  Co morbidities that complicate the patient evaluation  Past Medical History:  Diagnosis Date   Acute on chronic respiratory failure with hypoxia (HCC)    Autonomic dysfunction    CHF (congestive heart failure) (HCC)    Chronic systolic heart failure (HCC)    Guillain Barr syndrome    2021   Paroxysmal atrial fibrillation (HCC)    Prostate cancer metastatic to multiple sites (HCC)    Severe sepsis (HCC)       Dispostion: Disposition decision including need for hospitalization was considered, and patient admitted to the hospital.    Final Clinical Impression(s) / ED Diagnoses Final diagnoses:  Hyperkalemia     This chart was dictated using voice recognition software.  Despite best efforts to proofread,  errors can occur which can change the documentation meaning.    Francesca Fallow CROME, MD 06/05/24 2213

## 2024-06-05 NOTE — ED Triage Notes (Signed)
 [facility]

## 2024-06-06 ENCOUNTER — Encounter (HOSPITAL_COMMUNITY): Payer: Self-pay | Admitting: Student

## 2024-06-06 ENCOUNTER — Observation Stay (HOSPITAL_COMMUNITY)

## 2024-06-06 DIAGNOSIS — E875 Hyperkalemia: Secondary | ICD-10-CM | POA: Diagnosis not present

## 2024-06-06 LAB — COMPREHENSIVE METABOLIC PANEL WITH GFR
ALT: 18 U/L (ref 0–44)
AST: 23 U/L (ref 15–41)
Albumin: 2.9 g/dL — ABNORMAL LOW (ref 3.5–5.0)
Alkaline Phosphatase: 3173 U/L — ABNORMAL HIGH (ref 38–126)
Anion gap: 9 (ref 5–15)
BUN: 30 mg/dL — ABNORMAL HIGH (ref 8–23)
CO2: 22 mmol/L (ref 22–32)
Calcium: 8.2 mg/dL — ABNORMAL LOW (ref 8.9–10.3)
Chloride: 105 mmol/L (ref 98–111)
Creatinine, Ser: 2.18 mg/dL — ABNORMAL HIGH (ref 0.61–1.24)
GFR, Estimated: 30 mL/min — ABNORMAL LOW (ref >60)
Glucose, Bld: 95 mg/dL (ref 70–99)
Potassium: 5.3 mmol/L — ABNORMAL HIGH (ref 3.5–5.1)
Sodium: 136 mmol/L (ref 135–145)
Total Bilirubin: 1.2 mg/dL (ref 0.0–1.2)
Total Protein: 6 g/dL — ABNORMAL LOW (ref 6.5–8.1)

## 2024-06-06 LAB — GLUCOSE, CAPILLARY: Glucose-Capillary: 102 mg/dL — ABNORMAL HIGH (ref 70–99)

## 2024-06-06 LAB — CBC WITH DIFFERENTIAL/PLATELET
Abs Immature Granulocytes: 0.26 K/uL — ABNORMAL HIGH (ref 0.00–0.07)
Basophils Absolute: 0 K/uL (ref 0.0–0.1)
Basophils Relative: 0 %
Eosinophils Absolute: 1.1 K/uL — ABNORMAL HIGH (ref 0.0–0.5)
Eosinophils Relative: 15 %
HCT: 23.5 % — ABNORMAL LOW (ref 39.0–52.0)
Hemoglobin: 7.3 g/dL — ABNORMAL LOW (ref 13.0–17.0)
Immature Granulocytes: 4 %
Lymphocytes Relative: 24 %
Lymphs Abs: 1.7 K/uL (ref 0.7–4.0)
MCH: 31.6 pg (ref 26.0–34.0)
MCHC: 31.1 g/dL (ref 30.0–36.0)
MCV: 101.7 fL — ABNORMAL HIGH (ref 80.0–100.0)
Monocytes Absolute: 0.7 K/uL (ref 0.1–1.0)
Monocytes Relative: 9 %
Neutro Abs: 3.5 K/uL (ref 1.7–7.7)
Neutrophils Relative %: 48 %
Platelets: 143 K/uL — ABNORMAL LOW (ref 150–400)
RBC: 2.31 MIL/uL — ABNORMAL LOW (ref 4.22–5.81)
RDW: 17.2 % — ABNORMAL HIGH (ref 11.5–15.5)
WBC: 7.3 K/uL (ref 4.0–10.5)
nRBC: 0.4 % — ABNORMAL HIGH (ref 0.0–0.2)

## 2024-06-06 LAB — CBC
HCT: 22 % — ABNORMAL LOW (ref 39.0–52.0)
Hemoglobin: 7 g/dL — ABNORMAL LOW (ref 13.0–17.0)
MCH: 31.5 pg (ref 26.0–34.0)
MCHC: 31.8 g/dL (ref 30.0–36.0)
MCV: 99.1 fL (ref 80.0–100.0)
Platelets: 120 K/uL — ABNORMAL LOW (ref 150–400)
RBC: 2.22 MIL/uL — ABNORMAL LOW (ref 4.22–5.81)
RDW: 17.3 % — ABNORMAL HIGH (ref 11.5–15.5)
WBC: 7.1 K/uL (ref 4.0–10.5)
nRBC: 0 % (ref 0.0–0.2)

## 2024-06-06 LAB — URIC ACID: Uric Acid, Serum: 14.9 mg/dL — ABNORMAL HIGH (ref 3.7–8.6)

## 2024-06-06 LAB — RETICULOCYTES
Immature Retic Fract: 24.6 % — ABNORMAL HIGH (ref 2.3–15.9)
RBC.: 2.32 MIL/uL — ABNORMAL LOW (ref 4.22–5.81)
Retic Count, Absolute: 64.7 K/uL (ref 19.0–186.0)
Retic Ct Pct: 2.8 % (ref 0.4–3.1)

## 2024-06-06 LAB — IMMATURE PLATELET FRACTION: Immature Platelet Fraction: 2.6 % (ref 1.2–8.6)

## 2024-06-06 LAB — BILIRUBIN, FRACTIONATED(TOT/DIR/INDIR)
Bilirubin, Direct: 0.1 mg/dL (ref 0.0–0.2)
Indirect Bilirubin: 1 mg/dL — ABNORMAL HIGH (ref 0.3–0.9)
Total Bilirubin: 1.1 mg/dL (ref 0.0–1.2)

## 2024-06-06 LAB — TECHNOLOGIST SMEAR REVIEW
Clinical Information: HIGH
Plt Morphology: NORMAL

## 2024-06-06 LAB — MAGNESIUM: Magnesium: 2.4 mg/dL (ref 1.7–2.4)

## 2024-06-06 LAB — PHOSPHORUS: Phosphorus: 3.2 mg/dL (ref 2.5–4.6)

## 2024-06-06 LAB — LACTATE DEHYDROGENASE: LDH: 203 U/L — ABNORMAL HIGH (ref 98–192)

## 2024-06-06 MED ORDER — FUROSEMIDE 40 MG PO TABS: 40.0000 mg | ORAL_TABLET | Freq: Every day | ORAL | Status: DC

## 2024-06-06 MED ORDER — SODIUM CHLORIDE 0.9 % IV SOLN
6.0000 mg | Freq: Once | INTRAVENOUS | Status: AC
  Administered 2024-06-06: 6 mg via INTRAVENOUS
  Filled 2024-06-06: qty 4

## 2024-06-06 MED ORDER — SODIUM CHLORIDE 0.9 % IV SOLN: INTRAVENOUS | Status: DC

## 2024-06-06 MED ORDER — SODIUM ZIRCONIUM CYCLOSILICATE 5 G PO PACK
5.0000 g | PACK | Freq: Every day | ORAL | Status: DC
  Administered 2024-06-06: 5 g via ORAL
  Filled 2024-06-06 (×3): qty 1

## 2024-06-06 NOTE — TOC CM/SW Note (Addendum)
 Transition of Care [facility]) - Inpatient Brief Assessment   Patient Details  Name: Joseph Mayo MRN: [id number] Date of Birth: February 10, 1946  [facility]) CM/SW Contact:    Tom-Johnson, Harvest Muskrat, RN Phone Number: 06/06/2024, 10:54 AM   Clinical Narrative:  Patient presented to the ED from the [facility] with elevated Potassium Levels. Labs in the ED showed  Potassium 6.3, BUN 31, Creatinine 2.12.  Chest X-ray showed diffuse patchy Airspace and Interstitial Opacities. Patient has hx of  Metastatic Prostate Cancer, Bone Metastases, Chronic combined Systolic and Diastolic Heart Failure, CKD 3B with baseline Creatinine of 1.7-2.0, Anemia and elevated Alkaline Phosphate.   CM spoke with patient at bedside about needs for post hospital transition. Patient states he lives with his daughter, Garvin, has three supportive children. Modified independent, has a cane, walker, shower seat, w/c and rails at home. Patient states he does not drive, his daughters transports to and from appointments.  PCP is Ted Jude, MD and uses the [facility].  No ICM needs or recommendations noted at this time.  Patient not Medically ready for discharge.  CM will continue to follow as patient progresses with care towards discharge.   13:00- Home health recommended, Patient states he is active with [facility], resumption of care referral called to [facility] with acceptance voiced, info on AVS.  CM will continue to follow as patient progresses with care towards discharge.         Transition of Care Asessment: Insurance and Status: Insurance coverage has been reviewed Patient has primary care physician: Yes Home environment has been reviewed: Yes Prior level of function:: Modified independent Prior/Current Home Services: Current home services (Active with [place]) Social Drivers of Health Review: SDOH reviewed no interventions necessary Readmission risk has been reviewed:  Yes Transition of care needs: transition of care needs identified, TOC will continue to follow

## 2024-06-06 NOTE — Assessment & Plan Note (Signed)
 Improved overnight with Lasix - Start Lokelma - Trend K - Tumor lysis labs

## 2024-06-06 NOTE — Evaluation (Signed)
 Physical Therapy Brief Evaluation and Discharge Note Patient Details Name: Joseph Mayo MRN: [id number] DOB: Aug 02, 1946 Today's Date: 06/06/2024   History of Present Illness  78 yo M adm 06/05/24 with hyperkalemia. PMHx: prostate CA with bone mets, HTN, CKD, PAF, guillian barre, CHF, autonomic dysfunction, bil foot drop, 04/17/24 Rt hip fx  Clinical Impression  Pt very pleasant with daughter present throughout session. Pt with fall 04/17/24 after motorized tricycle with pt stating return to baseline function with RW use since that time and active with HHPT. Pt with decreased strength, balance and posture but baseline for pt. Pt able to perform transfers and gait with RW, has family assist and will benefit from continued HHPT. Acutely will defer to mobility specialists/nursing with pt encouraged to ambulate. No further acute therapy needs with pt and family agreeable, will sign off.        PT Assessment All further PT needs can be met in the next venue of care  Assistance Needed at Discharge  PRN    Equipment Recommendations None recommended by PT  Recommendations for Other Services       Precautions/Restrictions Precautions Precautions: Fall;Other (comment) Recall of Precautions/Restrictions: Intact Precaution/Restrictions Comments: bil foot drop        Mobility  Bed Mobility Rolling: Modified independent (Device/Increase time)        Transfers Overall transfer level: Modified independent                      Ambulation/Gait Ambulation/Gait assistance: Supervision Gait Distance (Feet): 215 Feet Assistive device: Rolling walker (2 wheels) Gait Pattern/deviations: Step-through pattern, Decreased stride length, Decreased dorsiflexion - right, Decreased dorsiflexion - left, Trunk flexed Gait Speed: Pace WFL General Gait Details: pt with bil foot drop, flexed trunk and cues for posture. pt with steady gait with RW and able to self-regulate distance  Home  Activity Instructions    Stairs            Modified Rankin (Stroke Patients Only)        Balance Overall balance assessment: Needs assistance   Sitting balance-Leahy Scale: Good     Standing balance support: Bilateral upper extremity supported, Reliant on assistive device for balance, During functional activity Standing balance-Leahy Scale: Poor Standing balance comment: RW for gait          Pertinent Vitals/Pain PT - Brief Vital Signs All Vital Signs Stable: Yes Pain Assessment Pain Assessment: No/denies pain     Home Living Family/patient expects to be discharged to:: Private residence Living Arrangements: Children Available Help at Discharge: Family;Available PRN/intermittently Home Environment: Ramped entrance   Home Equipment: Rolling Walker (2 wheels);BSC/3in1;Wheelchair - manual;Hospital bed;Other (comment);Shower seat Water quality scientist)        Prior Function Level of Independence: Needs assistance Comments: daughters supervises for baths, pt walks with RW, pt mows the yard and family assist with IADLs    UE/LE Assessment   UE ROM/Strength/Tone/Coordination: WFL    LE ROM/Strength/Tone/Coordination: Impaired LE ROM/Strength/Tone/Coordination Deficits: bil foot drop baseline    Communication   Communication Communication: No apparent difficulties     Cognition Overall Cognitive Status: Appears within functional limits for tasks assessed/performed       General Comments      Exercises     Assessment/Plan    PT Problem List Decreased activity tolerance;Decreased balance;Decreased mobility;Decreased knowledge of use of DME;Decreased strength       PT Visit Diagnosis Other abnormalities of gait and mobility (R26.89);Muscle weakness (generalized) (M62.81)    No  Skilled PT     Co-evaluation                AMPAC 6 Clicks Help needed turning from your back to your side while in a flat bed without using bedrails?: None Help needed moving  from lying on your back to sitting on the side of a flat bed without using bedrails?: None Help needed moving to and from a bed to a chair (including a wheelchair)?: A Little Help needed standing up from a chair using your arms (e.g., wheelchair or bedside chair)?: A Little Help needed to walk in hospital room?: A Little Help needed climbing 3-5 steps with a railing? : A Little 6 Click Score: 20      End of Session   Activity Tolerance: Patient tolerated treatment well Patient left: in chair;with call bell/phone within reach;with family/visitor present Nurse Communication: Mobility status PT Visit Diagnosis: Other abnormalities of gait and mobility (R26.89);Muscle weakness (generalized) (M62.81)     Time: 8766-8752 PT Time Calculation (min) (ACUTE ONLY): 14 min  Charges:   PT Evaluation $PT Eval Low Complexity: 1 Low      Shareece Bultman P, PT Acute Rehabilitation Services Office: [phone]   Lenoard NOVAK Senora Lacson  06/06/2024, 1:18 PM

## 2024-06-06 NOTE — Assessment & Plan Note (Signed)
 Hgb down to 7.0 today, asymptomatic.  During previous hospitalizations, this was considered due to myelosuppression.  He required multiple transfusion during August admission for hip fracture - Consult Hematology, ?medication effect vs myelosuppression alone - Trend Hgb

## 2024-06-06 NOTE — Assessment & Plan Note (Addendum)
 Creatinine at the [facility] was 1.07 in Mar 2024.  No repeat until Aug 2025 when he was admitted to [facility] for hip fracture, found to have widely metastatic prostate cancer to bone, soft tissue density obstructing right ureter, severe right hydronephrosis, and severely thinned renal cortex.  Probably slow and chronic renal failure from obstruction.  Stenting performed in August without change in Cr.  Has follow up with [facility] pending.  UA here acellular.  Cr stable relative to recent baseline 1.9-2.1 - Avoid hypotension or nephrotoxins - Follow up with [facility] for stent exchange in 1-2 weeks

## 2024-06-06 NOTE — Progress Notes (Signed)
 Progress Note   Patient: Joseph Mayo FMW:[id number] DOB: September 14, 1945 DOA: 06/05/2024     0 DOS: the patient was seen and examined on 06/06/2024 at 9:45AM and 2:20PM      Brief hospital course: 78 y.o. 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            Assessment and Plan: * Hyperkalemia Improved overnight with Lasix - Start Lokelma - Trend K - Tumor lysis labs     Essential hypertension BP okay - Continue Coreg - Hold Imdur  Prostate cancer metastatic to bone [facility]) Diagnosed this past August.  Recent inadvertent DHEA use obtained on [employer] per [facility] (was trying to take [facility] for memory).  Started Casodex at that time, has had Eligard x2 (first dose 9/11, second dose 10/15) - Consult Oncology   Normocytic anemia Hgb down to 7.0 today, asymptomatic.  During previous hospitalizations, this was considered due to myelosuppression.  He required multiple transfusion during August admission for hip fracture - Consult Hematology, ?medication effect vs myelosuppression alone - Trend Hgb  Chronic kidney disease, stage 3b (HCC) Creatinine at the [facility] was 1.07 in Mar 2024.  No repeat until Aug 2025 when he was admitted to [facility] for hip fracture, found to have widely metastatic prostate cancer to bone, soft tissue density obstructing right ureter, severe right hydronephrosis, and severely thinned renal cortex.  Probably slow and chronic renal failure from obstruction.  Stenting performed in August without change in Cr.  Has follow up with [facility] pending.  UA here acellular.  Cr stable relative to  recent baseline 1.9-2.1 - Avoid hypotension or nephrotoxins - Follow up with [facility] for stent exchange in 1-2 weeks  Chronic combined systolic and diastolic CHF (congestive heart failure) (HCC) Appears euvolemic.  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  But I see no cardiology follow up since then.    During recent admission two weeks ago for flash pulmonary edema and Takotsubo CM, noted to have EF down to 30-35%.  Started on BB and Imdur then, I see no cardiology follow up plans.  Appears euvolemic today. - Continue furosemide, transition back to oral - Continue Coreg - Hold Imdur until BP improves  Paroxysmal atrial fibrillation (HCC) Diagnosed in 2021 in setting of Guillain barre and associated PE.  On Eliquis for a time, now off. - Continue Coreg          Subjective: Feeling okay.  Wants to go home.  No fatigue with abulating.  No pain.  No fever.  No bleeding.     Physical Exam: BP (!) 103/54 (BP Location: Left Arm)   Pulse 70   Temp 98.3 F (36.8 C)   Resp 18   Ht 5' 9 (1.753 m)   Wt 67.6 kg   SpO2 97%   BMI 22.01 kg/m   Thin adult male, lying in bed, no acute distress, pale RRR no murmurs, legs in comrpession hose, trace pitting only Respiratory rate normal, lungs clear Abdomen soft, no TTP no masses Attention normal, laconic but seems appropriate in responses, face symemtric, speech fluent.  Data Reviewed: Discussed with  Oncology Extensive outside records reviewed K down to 5.3, Cr stable CBC shows Hgb down to 7, platelets trending down    Family Communication: Daughter at bedside    Disposition: Status is: Observation         Author: Lonni SHAUNNA Dalton, MD 06/06/2024 3:29 PM  For on call review [email or web address].

## 2024-06-06 NOTE — Assessment & Plan Note (Signed)
 Diagnosed this past August.  Recent inadvertent DHEA use obtained on [employer] per [facility] (was trying to take [facility] for memory).  Started Casodex at that time, has had Eligard x2 (first dose 9/11, second dose 10/15) - Consult Oncology

## 2024-06-06 NOTE — Hospital Course (Addendum)
 78 y.o. 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

## 2024-06-06 NOTE — Consult Note (Addendum)
  Cancer Center CONSULT NOTE  Patient Care Team: Clinic, [facility] as PCP - General  CHIEF COMPLAINTS/PURPOSE OF CONSULTATION:  Concern for hypercalcemia and anemia in setting of prostate cancer and treatment with Eligard    REFERRING PHYSICIAN: Dr. Jonel  HISTORY OF PRESENTING ILLNESS:  Joseph Mayo 78 y.o. male who was admitted on 06/05/2024 due to abnormal labs/elevated potassium.  Patient has oncologic history significant for metastatic prostate cancer with bone mets and is on oncologic therapy at [facility] oncology.  Therefore oncology evaluation has been requested. Patient is seen awake and alert laying in bed.  Patient's daughter is at bedside.  She reports that he sees the oncologist at [facility] and was seen yesterday 06/05/2024.  She states they received a call for him to go to the ED because of his high potassium levels.  States that he feels okay.  Denies dizziness, lightheadedness, headaches, acute GI symptoms.   Medical history as stated is significant for metastatic prostate cancer with bone mets.  He also has CKD, and anemia of chronic disease, history of Guillain-Barr syndrome, CHF and A-fib. Surgical history is noncontributory. Family oncologic history is noncontributory. Social history is noncontributory.  Denies occupational toxic exposure.  Pulled mobile homes for living.  Denies tobacco use, alcohol use or illicit drug use.  Grew up on a farm in [place] .  He is currently a vegan.    I have reviewed his chart and materials related to his cancer extensively and collaborated history with the patient. Summary of oncologic history is as follows: Oncology History   No history exists.    ASSESSMENT & PLAN:  Metastatic prostate cancer with bone mets - Diagnosed 2 months ago status post fall and hip fracture at [facility] med center. - Follows with oncologist at [facility] and was seen yesterday 06/05/2024.   Started on leuprolide on 05/02/2024 and received second dose on 06/05/2024. - Continue outpatient follow-up with primary oncologist upon discharge.  Concern for tumor lysis - In setting of recent start of oncologic therapy for malignancy. - Ordered tumor lysis labs including haptoglobin, LDH, immature platelet fraction, reticulocytes, bilirubin.  Pending results, will follow results.  Hyperkalemia -Potassium 6.3 on admission. - Improved overnight with Lasix. -Has decreased to 5.3 today.  Anemia - Hemoglobin low 7.0 - Patient has received multiple PRBC transfusions when he was admitted in August with hip fracture. - Recommend PRBC transfusion for hemoglobin <7.0 - Continue to monitor CBC with differential    MEDICAL HISTORY:  Past Medical History:  Diagnosis Date   Acute on chronic respiratory failure with hypoxia (HCC)    Autonomic dysfunction    CHF (congestive heart failure) (HCC)    Chronic systolic heart failure (HCC)    Guillain Barr syndrome    2021   Paroxysmal atrial fibrillation (HCC)    Prostate cancer metastatic to multiple sites (HCC)    Severe sepsis (HCC)     SURGICAL HISTORY: History reviewed. No pertinent surgical history.  SOCIAL HISTORY: Social History   Socioeconomic History   Marital status: Unknown    Spouse name: Not on file   Number of children: Not on file   Years of education: Not on file   Highest education level: Not on file  Occupational History   Not on file  Tobacco Use   Smoking status: Never   Smokeless tobacco: Never  Substance and Sexual Activity   Alcohol use: Never   Drug use: Never  Sexual activity: Not on file  Other Topics Concern   Not on file  Social History Narrative   Not on file   Social Drivers of Health   Financial Resource Strain: High Risk (07/29/2020)   Received from [facility]   Overall Financial Resource Strain (CARDIA)    Difficulty of Paying Living Expenses: Hard  Food Insecurity: No Food  Insecurity (06/05/2024)   Hunger Vital Sign    Worried About Running Out of Food in the Last Year: Never true    Ran Out of Food in the Last Year: Never true  Transportation Needs: No Transportation Needs (06/05/2024)   PRAPARE - Administrator, Civil Service (Medical): No    Lack of Transportation (Non-Medical): No  Physical Activity: Not on file  Stress: No Stress Concern Present (04/25/2024)   Received from [facility] - Occupational Stress Questionnaire    Do you feel stress - tense, restless, nervous, or anxious, or unable to sleep at night because your mind is troubled all the time - these days?: Not at all  Social Connections: Moderately Isolated (06/05/2024)   Social Connection and Isolation Panel    Frequency of Communication with Friends and Family: More than three times a week    Frequency of Social Gatherings with Friends and Family: Three times a week    Attends Religious Services: More than 4 times per year    Active Member of Clubs or Organizations: No    Attends Banker Meetings: Patient unable to answer    Marital Status: Widowed  Intimate Partner Violence: Not At Risk (06/05/2024)   Humiliation, Afraid, Rape, and Kick questionnaire    Fear of Current or Ex-Partner: No    Emotionally Abused: No    Physically Abused: No    Sexually Abused: No    FAMILY HISTORY: History reviewed. No pertinent family history.   PHYSICAL EXAMINATION: ECOG PERFORMANCE STATUS: 3 - Symptomatic, >50% confined to bed  Vitals:   06/06/24 0255 06/06/24 0923  BP: (!) 105/51 (!) 103/54  Pulse: 68 70  Resp: 15 18  Temp: 99.6 F (37.6 C) 98.3 F (36.8 C)  SpO2: 97% 97%   Filed Weights   06/05/24 1829 06/05/24 2246 06/05/24 2320  Weight: 152 lb 8.9 oz (69.2 kg) 150 lb 2.1 oz (68.1 kg) 149 lb 0.5 oz (67.6 kg)    GENERAL: alert, no distress and comfortable SKIN: skin color, texture, turgor are normal, no rashes or  significant lesions EYES: normal, conjunctiva are pink and non-injected, sclera clear OROPHARYNX: no exudate, no erythema and lips, buccal mucosa, and tongue normal  NECK: supple, thyroid normal size, non-tender, without nodularity LYMPH: no palpable lymphadenopathy in the cervical, axillary or inguinal LUNGS: clear to auscultation and percussion with normal breathing effort HEART: regular rate & rhythm and no murmurs and no lower extremity edema ABDOMEN: abdomen soft, non-tender and normal bowel sounds MUSCULOSKELETAL: no cyanosis of digits and no clubbing  PSYCH: alert & oriented x 3 with fluent speech NEURO: no focal motor/sensory deficits   ALLERGIES:  has no known allergies.  MEDICATIONS:  Current Facility-Administered Medications  Medication Dose Route Frequency Provider Last Rate Last Admin   acetaminophen (TYLENOL) tablet 650 mg  650 mg Oral Q6H PRN Amponsah, Prosper M, MD       Or   acetaminophen (TYLENOL) suppository 650 mg  650 mg Rectal Q6H PRN Amponsah, Prosper M, MD       bisacodyl (DULCOLAX)  EC tablet 5 mg  5 mg Oral Daily PRN Amponsah, Prosper M, MD       calcitRIOL (ROCALTROL) capsule 0.25 mcg  0.25 mcg Oral Daily Lou Claretta HERO, MD   0.25 mcg at 06/06/24 0923   calcium carbonate (TUMS - dosed in mg elemental calcium) chewable tablet 400 mg of elemental calcium  1,000 mg Oral BID WC Lou Claretta HERO, MD   400 mg of elemental calcium at 06/06/24 0920   carvedilol (COREG) tablet 6.25 mg  6.25 mg Oral BID WC Lou Claretta HERO, MD   6.25 mg at 06/06/24 0921   cyanocobalamin (VITAMIN B12) tablet 1,000 mcg  1,000 mcg Oral Daily Lou Claretta HERO, MD   1,000 mcg at 06/06/24 9074   darolutamide (NUBEQA) tablet 600 mg  600 mg Oral BID WC Lou Claretta HERO, MD       furosemide (LASIX) injection 40 mg  40 mg Intravenous Daily Lou Claretta HERO, MD   40 mg at 06/06/24 9078   isosorbide mononitrate (IMDUR) 24 hr tablet 15 mg  15 mg Oral Daily Lou Claretta HERO, MD    15 mg at 06/06/24 9077   omega-3 acid ethyl esters (LOVAZA) capsule 1,000 mg  1,000 mg Oral Daily Lou Claretta HERO, MD   1,000 mg at 06/06/24 0920   ondansetron (ZOFRAN) tablet 4 mg  4 mg Oral Q6H PRN Lou Claretta HERO, MD       Or   ondansetron (ZOFRAN) injection 4 mg  4 mg Intravenous Q6H PRN Lou Claretta HERO, MD       senna-docusate (Senokot-S) tablet 1 tablet  1 tablet Oral QHS PRN Lou Claretta HERO, MD       sodium zirconium cyclosilicate (LOKELMA) packet 5 g  5 g Oral Daily Danford, Lonni SQUIBB, MD         LABORATORY DATA:  I have reviewed the data as listed Lab Results  Component Value Date   WBC 7.1 06/06/2024   HGB 7.0 (L) 06/06/2024   HCT 22.0 (L) 06/06/2024   MCV 99.1 06/06/2024   PLT 120 (L) 06/06/2024   Recent Labs    05/28/24 0639 05/28/24 1204 05/30/24 0308 06/05/24 1834 06/06/24 0341  NA 136   < > 140 134* 136  K 5.4*   < > 3.8 6.3* 5.3*  CL 105   < > 104 104 105  CO2 21*   < > 24 22 22   GLUCOSE 132*   < > 87 113* 95  BUN 20   < > 27* 31* 30*  CREATININE 1.81*   < > 2.04* 2.12* 2.18*  CALCIUM 8.2*   < > 7.7* 8.6* 8.2*  GFRNONAA 38*   < > 33* 31* 30*  PROT 5.8*  --   --  6.8 6.0*  ALBUMIN 2.7*  --   --  3.3* 2.9*  AST 29  --   --  27 23  ALT 10  --   --  19 18  ALKPHOS 3,174*  --   --  3,465* 3,173*  BILITOT 1.0  --   --  1.5* 1.2   < > = values in this interval not displayed.    RADIOGRAPHIC STUDIES: I have personally reviewed the radiological images as listed and agreed with the findings in the report. ECHOCARDIOGRAM COMPLETE Result Date: 05/28/2024    ECHOCARDIOGRAM REPORT   Patient Name:   SAAMIR ARMSTRONG Date of Exam: 05/28/2024 Medical Rec #:  [id number]  Height:       69.0 in Accession #:    [id number]         Weight:       150.0 lb Date of Birth:  04/27/1946           BSA:          1.828 m Patient Age:    78 years           BP:           96/66 mmHg Patient Gender: M                  HR:           69 bpm. Exam Location:   Inpatient Procedure: 2D Echo, Cardiac Doppler, Color Doppler and Intracardiac            Opacification Agent (Both Spectral and Color Flow Doppler were            utilized during procedure). Indications:    CHF I50.31  History:        Patient has prior history of Echocardiogram examinations, most                 recent 07/21/2020. CHF, Arrythmias:Atrial Fibrillation;                 Signs/Symptoms:Shortness of Breath and Edema. H/O Metastatic                 prostate cancer.  Sonographer:    BERNARDA ROCKS Referring Phys: [id number] JUSTIN B HOWERTER IMPRESSIONS  1. Left ventricular ejection fraction, by estimation, is 30 to 35%. The left ventricle has moderately decreased function. The left ventricle demonstrates regional wall motion abnormalities (see scoring diagram/findings for description). The left ventricular internal cavity size was moderately to severely dilated. Left ventricular diastolic parameters are consistent with Grade II diastolic dysfunction (pseudonormalization). There is anterior and septal wall akinesis with hypokinesis elsewhere.  2. Right ventricular systolic function is normal. The right ventricular size is normal.  3. Left atrial size was moderately dilated.  4. The mitral valve is normal in structure. Trivial mitral valve regurgitation.  5. The aortic valve is tricuspid. Aortic valve regurgitation is not visualized. No aortic stenosis is present. FINDINGS  Left Ventricle: Left ventricular ejection fraction, by estimation, is 30 to 35%. The left ventricle has moderately decreased function. The left ventricle demonstrates regional wall motion abnormalities. Definity contrast agent was given IV to delineate the left ventricular endocardial borders. The left ventricular internal cavity size was moderately to severely dilated. There is no left ventricular hypertrophy. Left ventricular diastolic parameters are consistent with Grade II diastolic dysfunction (pseudonormalization).  LV Wall Scoring: The  entire anterior wall, entire septum, and apex are akinetic. The antero-lateral wall, mid and distal lateral wall, and entire inferior wall are hypokinetic. The basal inferolateral segment is normal. There is anterior and septal wall akinesis with hypokinesis elsewhere. Right Ventricle: The right ventricular size is normal. No increase in right ventricular wall thickness. Right ventricular systolic function is normal. Left Atrium: Left atrial size was moderately dilated. Right Atrium: Right atrial size was normal in size. Pericardium: There is no evidence of pericardial effusion. Mitral Valve: The mitral valve is normal in structure. Trivial mitral valve regurgitation. MV peak gradient, 2.1 mmHg. The mean mitral valve gradient is 1.0 mmHg. Tricuspid Valve: The tricuspid valve is normal in structure. Tricuspid valve regurgitation is trivial. Aortic Valve: The aortic valve is tricuspid. Aortic valve regurgitation is  not visualized. No aortic stenosis is present. Aortic valve mean gradient measures 3.0 mmHg. Aortic valve peak gradient measures 6.0 mmHg. Aortic valve area, by VTI measures 2.86 cm. Pulmonic Valve: The pulmonic valve was not well visualized. Pulmonic valve regurgitation is not visualized. Aorta: The aortic root and ascending aorta are structurally normal, with no evidence of dilitation. IAS/Shunts: No atrial level shunt detected by color flow Doppler.  LEFT VENTRICLE PLAX 2D LVIDd:         5.50 cm      Diastology LVIDs:         4.40 cm      LV e' medial:    6.53 cm/s LV PW:         0.80 cm      LV E/e' medial:  11.5 LV IVS:        0.90 cm      LV e' lateral:   6.74 cm/s LVOT diam:     2.10 cm      LV E/e' lateral: 11.2 LV SV:         55 LV SV Index:   30 LVOT Area:     3.46 cm  LV Volumes (MOD) LV vol d, MOD A2C: 226.0 ml LV vol d, MOD A4C: 181.0 ml LV vol s, MOD A2C: 159.0 ml LV vol s, MOD A4C: 113.0 ml LV SV MOD A2C:     67.0 ml LV SV MOD A4C:     181.0 ml LV SV MOD BP:      72.9 ml RIGHT VENTRICLE              IVC RV Basal diam:  3.10 cm     IVC diam: 1.90 cm RV S prime:     12.40 cm/s TAPSE (M-mode): 2.3 cm LEFT ATRIUM             Index        RIGHT ATRIUM           Index LA diam:        4.50 cm 2.46 cm/m   RA Area:     17.30 cm LA Vol (A2C):   68.7 ml 37.58 ml/m  RA Volume:   49.70 ml  27.18 ml/m LA Vol (A4C):   87.2 ml 47.70 ml/m LA Biplane Vol: 81.4 ml 44.52 ml/m  AORTIC VALVE                    PULMONIC VALVE AV Area (Vmax):    2.45 cm     PV Vmax:          0.68 m/s AV Area (Vmean):   2.62 cm     PV Peak grad:     1.9 mmHg AV Area (VTI):     2.86 cm     PR End Diast Vel: 1.54 msec AV Vmax:           122.00 cm/s AV Vmean:          72.900 cm/s AV VTI:            0.194 m AV Peak Grad:      6.0 mmHg AV Mean Grad:      3.0 mmHg LVOT Vmax:         86.20 cm/s LVOT Vmean:        55.100 cm/s LVOT VTI:          0.160 m LVOT/AV VTI ratio: 0.82  AORTA Ao Root diam: 3.50 cm Ao Asc  diam:  3.80 cm MITRAL VALVE MV Area (PHT): 3.23 cm    SHUNTS MV Area VTI:   2.50 cm    Systemic VTI:  0.16 m MV Peak grad:  2.1 mmHg    Systemic Diam: 2.10 cm MV Mean grad:  1.0 mmHg MV Vmax:       0.73 m/s MV Vmean:      46.7 cm/s MV Decel Time: 235 msec MR Peak grad: 32.3 mmHg MR Vmax:      284.00 cm/s MV E velocity: 75.40 cm/s MV A velocity: 53.10 cm/s MV E/A ratio:  1.42 Franck Azobou Tonleu Electronically signed by Joelle Cedars Tonleu Signature Date/Time: 05/28/2024/1:22:52 PM    Final    DG Chest Portable 1 View Result Date: 05/28/2024 CLINICAL DATA:  Hypoxia., . Pt states that he woke up and started having issues breathing. EXAM: PORTABLE CHEST 1 VIEW COMPARISON:  Chest x-ray 07/23/2020 FINDINGS: The heart and mediastinal contours are within normal limits. Diffuse patchy airspace and interstitial opacities. No pleural effusion. No pneumothorax. No acute osseous abnormality. IMPRESSION: Diffuse patchy airspace and interstitial opacities. Electronically Signed   By: Morgane  Naveau M.D.   On: 05/28/2024 01:24     The total  time spent in the appointment was 55 minutes encounter with patients including review of chart and various tests results, discussions about plan of care and coordination of care plan   All questions were answered. The patient knows to call the clinic with any problems, questions or concerns. No barriers to learning was detected.  Attending Note  I personally saw the patient, reviewed the chart and examined the patient. The plan of care was discussed with the patient and the admitting team. I agree with the assessment and plan as documented above. Thank you very much for the consultation. He has met prostate cancer, recently started on Nubeqa about a week ago. Last PSA was 99. He also takes leuprolide. He was noted to have hyperkalemia. TLS labs show significant elevation of uric acid, will give him a dose of rasburicase. Will have to repeat labs tomorrow. We will consider allopurinol prophylaxis once uric acid improves. Anemia could be multifactorial, likely bone marrow involvement, agree transfusion to maintain Hb of 7. Will also give him NS 50 ml/hr for 500 ml, low EF noted, he didn't appear fluid overloaded to me.  Olam JINNY Brunner, NP 10/16/202512:57 PM

## 2024-06-06 NOTE — Assessment & Plan Note (Signed)
 Diagnosed in 2021 in setting of Guillain barre and associated PE.  On Eliquis for a time, now off. - Continue Coreg

## 2024-06-06 NOTE — Assessment & Plan Note (Addendum)
 Appears euvolemic.  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  But I see no cardiology follow up since then.    During recent admission two weeks ago for flash pulmonary edema and Takotsubo CM, noted to have EF down to 30-35%.  Started on BB and Imdur then, I see no cardiology follow up plans.  Appears euvolemic today. - Continue furosemide, transition back to oral - Continue Coreg - Hold Imdur until BP improves

## 2024-06-06 NOTE — Assessment & Plan Note (Signed)
 BP okay - Continue Coreg - Hold Imdur

## 2024-06-07 ENCOUNTER — Inpatient Hospital Stay (HOSPITAL_COMMUNITY)

## 2024-06-07 DIAGNOSIS — N1832 Chronic kidney disease, stage 3b: Secondary | ICD-10-CM | POA: Diagnosis present

## 2024-06-07 DIAGNOSIS — D696 Thrombocytopenia, unspecified: Secondary | ICD-10-CM | POA: Diagnosis present

## 2024-06-07 DIAGNOSIS — Z79899 Other long term (current) drug therapy: Secondary | ICD-10-CM | POA: Diagnosis not present

## 2024-06-07 DIAGNOSIS — N131 Hydronephrosis with ureteral stricture, not elsewhere classified: Secondary | ICD-10-CM | POA: Diagnosis present

## 2024-06-07 DIAGNOSIS — C61 Malignant neoplasm of prostate: Secondary | ICD-10-CM | POA: Diagnosis present

## 2024-06-07 DIAGNOSIS — I480 Paroxysmal atrial fibrillation: Secondary | ICD-10-CM | POA: Diagnosis present

## 2024-06-07 DIAGNOSIS — E790 Hyperuricemia without signs of inflammatory arthritis and tophaceous disease: Secondary | ICD-10-CM | POA: Diagnosis present

## 2024-06-07 DIAGNOSIS — N179 Acute kidney failure, unspecified: Secondary | ICD-10-CM | POA: Diagnosis present

## 2024-06-07 DIAGNOSIS — I130 Hypertensive heart and chronic kidney disease with heart failure and stage 1 through stage 4 chronic kidney disease, or unspecified chronic kidney disease: Secondary | ICD-10-CM | POA: Diagnosis present

## 2024-06-07 DIAGNOSIS — N132 Hydronephrosis with renal and ureteral calculous obstruction: Secondary | ICD-10-CM | POA: Diagnosis present

## 2024-06-07 DIAGNOSIS — D631 Anemia in chronic kidney disease: Secondary | ICD-10-CM | POA: Diagnosis present

## 2024-06-07 DIAGNOSIS — Z9181 History of falling: Secondary | ICD-10-CM | POA: Diagnosis not present

## 2024-06-07 DIAGNOSIS — Z59868 Other specified financial insecurity: Secondary | ICD-10-CM | POA: Diagnosis not present

## 2024-06-07 DIAGNOSIS — I5042 Chronic combined systolic (congestive) and diastolic (congestive) heart failure: Secondary | ICD-10-CM | POA: Diagnosis present

## 2024-06-07 DIAGNOSIS — E875 Hyperkalemia: Secondary | ICD-10-CM | POA: Diagnosis present

## 2024-06-07 DIAGNOSIS — Z8619 Personal history of other infectious and parasitic diseases: Secondary | ICD-10-CM | POA: Diagnosis not present

## 2024-06-07 DIAGNOSIS — Z66 Do not resuscitate: Secondary | ICD-10-CM | POA: Diagnosis present

## 2024-06-07 DIAGNOSIS — G909 Disorder of the autonomic nervous system, unspecified: Secondary | ICD-10-CM | POA: Diagnosis present

## 2024-06-07 DIAGNOSIS — E883 Tumor lysis syndrome: Secondary | ICD-10-CM | POA: Diagnosis present

## 2024-06-07 DIAGNOSIS — C7951 Secondary malignant neoplasm of bone: Secondary | ICD-10-CM | POA: Diagnosis present

## 2024-06-07 HISTORY — PX: IR NEPHROSTOMY PLACEMENT RIGHT: IMG6064

## 2024-06-07 LAB — CBC WITH DIFFERENTIAL/PLATELET
Abs Immature Granulocytes: 0.13 K/uL — ABNORMAL HIGH (ref 0.00–0.07)
Basophils Absolute: 0 K/uL (ref 0.0–0.1)
Basophils Relative: 0 %
Eosinophils Absolute: 0.6 K/uL — ABNORMAL HIGH (ref 0.0–0.5)
Eosinophils Relative: 10 %
HCT: 22.3 % — ABNORMAL LOW (ref 39.0–52.0)
Hemoglobin: 7.3 g/dL — ABNORMAL LOW (ref 13.0–17.0)
Immature Granulocytes: 2 %
Lymphocytes Relative: 32 %
Lymphs Abs: 2 K/uL (ref 0.7–4.0)
MCH: 31.7 pg (ref 26.0–34.0)
MCHC: 32.7 g/dL (ref 30.0–36.0)
MCV: 97 fL (ref 80.0–100.0)
Monocytes Absolute: 0.6 K/uL (ref 0.1–1.0)
Monocytes Relative: 9 %
Neutro Abs: 2.9 K/uL (ref 1.7–7.7)
Neutrophils Relative %: 47 %
Platelets: 124 K/uL — ABNORMAL LOW (ref 150–400)
RBC: 2.3 MIL/uL — ABNORMAL LOW (ref 4.22–5.81)
RDW: 17.2 % — ABNORMAL HIGH (ref 11.5–15.5)
WBC: 6.2 K/uL (ref 4.0–10.5)
nRBC: 0.3 % — ABNORMAL HIGH (ref 0.0–0.2)

## 2024-06-07 LAB — COMPREHENSIVE METABOLIC PANEL WITH GFR
ALT: 14 U/L (ref 0–44)
AST: 17 U/L (ref 15–41)
Albumin: 2.9 g/dL — ABNORMAL LOW (ref 3.5–5.0)
Alkaline Phosphatase: 2905 U/L — ABNORMAL HIGH (ref 38–126)
Anion gap: 10 (ref 5–15)
BUN: 33 mg/dL — ABNORMAL HIGH (ref 8–23)
CO2: 24 mmol/L (ref 22–32)
Calcium: 8.2 mg/dL — ABNORMAL LOW (ref 8.9–10.3)
Chloride: 101 mmol/L (ref 98–111)
Creatinine, Ser: 2.34 mg/dL — ABNORMAL HIGH (ref 0.61–1.24)
GFR, Estimated: 28 mL/min — ABNORMAL LOW (ref >60)
Glucose, Bld: 93 mg/dL (ref 70–99)
Potassium: 4.5 mmol/L (ref 3.5–5.1)
Sodium: 135 mmol/L (ref 135–145)
Total Bilirubin: 1 mg/dL (ref 0.0–1.2)
Total Protein: 6.1 g/dL — ABNORMAL LOW (ref 6.5–8.1)

## 2024-06-07 LAB — PHOSPHORUS: Phosphorus: 3.5 mg/dL (ref 2.5–4.6)

## 2024-06-07 LAB — MAGNESIUM: Magnesium: 2.4 mg/dL (ref 1.7–2.4)

## 2024-06-07 LAB — URIC ACID: Uric Acid, Serum: 2.8 mg/dL — ABNORMAL LOW (ref 3.7–8.6)

## 2024-06-07 MED ORDER — SODIUM CHLORIDE 0.9 % IV SOLN
INTRAVENOUS | Status: AC | PRN
  Administered 2024-06-07: 2 g via INTRAVENOUS

## 2024-06-07 MED ORDER — MIDAZOLAM HCL 2 MG/2ML IJ SOLN
INTRAMUSCULAR | Status: AC
  Filled 2024-06-07: qty 4

## 2024-06-07 MED ORDER — SODIUM CHLORIDE 0.9 % IV SOLN: 2.0000 g | INTRAVENOUS | Status: AC

## 2024-06-07 MED ORDER — SODIUM CHLORIDE 0.9% FLUSH
5.0000 mL | Freq: Three times a day (TID) | INTRAVENOUS | Status: DC
  Administered 2024-06-07 – 2024-06-10 (×9): 5 mL

## 2024-06-07 MED ORDER — IOHEXOL 300 MG/ML  SOLN
50.0000 mL | Freq: Once | INTRAMUSCULAR | Status: AC | PRN
  Administered 2024-06-07: 20 mL

## 2024-06-07 MED ORDER — LIDOCAINE-EPINEPHRINE 1 %-1:100000 IJ SOLN
INTRAMUSCULAR | Status: AC
  Filled 2024-06-07: qty 1

## 2024-06-07 MED ORDER — SODIUM CHLORIDE 0.9 % IV SOLN: INTRAVENOUS | Status: DC

## 2024-06-07 MED ORDER — MIDAZOLAM HCL (PF) 2 MG/2ML IJ SOLN
INTRAMUSCULAR | Status: AC | PRN
  Administered 2024-06-07: .5 mg via INTRAVENOUS
  Administered 2024-06-07: 1 mg via INTRAVENOUS

## 2024-06-07 MED ORDER — LIDOCAINE HCL 1 % IJ SOLN
20.0000 mL | Freq: Once | INTRAMUSCULAR | Status: AC
  Administered 2024-06-07: 20 mL

## 2024-06-07 MED ORDER — SODIUM CHLORIDE 0.9 % IV SOLN
INTRAVENOUS | Status: AC
  Filled 2024-06-07: qty 20

## 2024-06-07 NOTE — Consult Note (Signed)
 Chief Complaint: Patient was seen in consultation today for right hydronephrosis-- for right percutaneous nephrostomy drain Chief Complaint  Patient presents with   Abnormal Labs   at the request of Dr JAYSON Dalton Dr SHAUNNA Stalls   Supervising Physician: Jenna Hacker  Patient Status: [facility] - In-pt  History of Present Illness: Joseph Mayo is a 78 y.o. male   Limited DNR per pt Current Code Status  Limited: Do not attempt resuscitation (DNR) -DNR-LIMITED -Do Not Intubate/DNI - Set by Lou Claretta HERO, MD at 06/05/2024 2225 (View report)  Question Answer  If pulseless and not breathing No CPR or chest compressions.  In Pre-Arrest Conditions (Patient Is Breathing and Has A Pulse) Do not intubate. Provide all appropriate non-invasive medical interventions. Avoid ICU transfer unless indicated or required.  Consent: Discussion documented in EHR or advanced directives reviewed       Metastatic prostate cancer; bone mets Follows with [facility] Last seen just 10/15 Came to ED after was made aware of high potassium- hyperkalemia Elevated Cr; AKIvs chronic disease Imaging today revealing Rt hydronephrosis Has existing Rt ureteral stent placed Sept 2025 Liam Discussion per chart Dr Dalton and Urology Requesting Rt PCN  Imaging reviewed with Dr Jenna--- he approves procedure (Pt has eaten at 100 pm today--- IR can perform procedure today with one agent if pt is agreeable. Would plan for 10/18 if not Pt is agreeable to proceed today) Daughter at bedside  Past Medical History:  Diagnosis Date   Acute on chronic respiratory failure with hypoxia (HCC)    Autonomic dysfunction    CHF (congestive heart failure) (HCC)    Chronic systolic heart failure (HCC)    Guillain Barr syndrome    2021   Paroxysmal atrial fibrillation (HCC)    Prostate cancer metastatic to multiple sites [facility])    Severe sepsis (HCC)     History reviewed. No pertinent surgical  history.  Allergies: Patient has no known allergies.  Medications: Prior to Admission medications   Medication Sig Start Date End Date Taking? Authorizing Provider  calcitRIOL (ROCALTROL) 0.25 MCG capsule Take 0.25 mcg by mouth daily. 05/08/24 06/07/24 Yes [provider]  carvedilol (COREG) 6.25 MG tablet Take 1 tablet (6.25 mg total) by mouth 2 (two) times daily with a meal. 05/30/24  Yes Rashid, Farhan, MD  Cyanocobalamin (B-12 PO) Take 1 tablet by mouth daily.   Yes [provider]  darolutamide (NUBEQA) 300 MG tablet Take 600 mg by mouth 2 (two) times daily with a meal.   Yes [provider]  furosemide (LASIX) 20 MG tablet Take 1 tablet (20 mg total) by mouth daily as needed. 05/30/24 05/30/25 Yes Rashid, Farhan, MD  isosorbide mononitrate (IMDUR) 30 MG 24 hr tablet Take 0.5 tablets (15 mg total) by mouth daily. Patient taking differently: Take 30 mg by mouth daily. 05/30/24  Yes Dino Antu, MD  Omega-3 Fatty Acids (OMEGA 3 PO) Take 1 capsule by mouth daily.   Yes [provider]  OVER THE COUNTER MEDICATION Take 1 Dose by mouth 2 (two) times daily. [place] T[place]   Yes [provider]  saw palmetto 160 MG capsule Take 160 mg by mouth 2 (two) times daily.   Yes [provider]  Turmeric (QC TUMERIC COMPLEX PO) Take 400 mg by mouth 2 (two) times daily.   Yes [provider]     History reviewed. No pertinent family history.  Social History   Socioeconomic History   Marital status:  Unknown    Spouse name: Not on file   Number of children: Not on file   Years of education: Not on file   Highest education level: Not on file  Occupational History   Not on file  Tobacco Use   Smoking status: Never   Smokeless tobacco: Never  Substance and Sexual Activity   Alcohol use: Never   Drug use: Never   Sexual activity: Not on file  Other Topics Concern   Not on file  Social History Narrative   Not on file   Social Drivers  of Health   Financial Resource Strain: High Risk (07/29/2020)   Received from [facility]   Overall Financial Resource Strain (CARDIA)    Difficulty of Paying Living Expenses: Hard  Food Insecurity: No Food Insecurity (06/05/2024)   Hunger Vital Sign    Worried About Running Out of Food in the Last Year: Never true    Ran Out of Food in the Last Year: Never true  Transportation Needs: No Transportation Needs (06/05/2024)   PRAPARE - Administrator, Civil Service (Medical): No    Lack of Transportation (Non-Medical): No  Physical Activity: Not on file  Stress: No Stress Concern Present (04/25/2024)   Received from [facility] - Occupational Stress Questionnaire    Do you feel stress - tense, restless, nervous, or anxious, or unable to sleep at night because your mind is troubled all the time - these days?: Not at all  Social Connections: Moderately Isolated (06/05/2024)   Social Connection and Isolation Panel    Frequency of Communication with Friends and Family: More than three times a week    Frequency of Social Gatherings with Friends and Family: Three times a week    Attends Religious Services: More than 4 times per year    Active Member of Clubs or Organizations: No    Attends Banker Meetings: Patient unable to answer    Marital Status: Widowed    Review of Systems: A 12 point ROS discussed and pertinent positives are indicated in the HPI above.  All other systems are negative.  Review of Systems  Constitutional:  Positive for activity change and appetite change. Negative for fatigue.  Respiratory:  Negative for cough, shortness of breath and wheezing.   Cardiovascular:  Negative for chest pain.  Gastrointestinal:  Negative for abdominal pain and diarrhea.  Neurological:  Positive for weakness.  Psychiatric/Behavioral:  Negative for behavioral problems and confusion.     Vital Signs: BP (!) 96/57 (BP  Location: Left Arm)   Pulse 76   Temp 99 F (37.2 C) (Oral)   Resp 18   Ht 5' 9 (1.753 m)   Wt 149 lb 0.5 oz (67.6 kg)   SpO2 98%   BMI 22.01 kg/m   Advance Care Plan: The advanced care plan/surrogate decision maker was discussed at the time of visit and documented in the medical record.    Physical Exam Vitals reviewed.  HENT:     Mouth/Throat:     Mouth: Mucous membranes are moist.  Cardiovascular:     Rate and Rhythm: Normal rate and regular rhythm.     Heart sounds: Normal heart sounds.  Pulmonary:     Effort: Pulmonary effort is normal.     Breath sounds: Normal breath sounds. No wheezing.  Abdominal:     Palpations: Abdomen is soft.  Musculoskeletal:        General: Normal  range of motion.  Skin:    General: Skin is warm.  Neurological:     Mental Status: He is alert and oriented to person, place, and time.  Psychiatric:        Behavior: Behavior normal.     Imaging: US  RENAL Result Date: 06/06/2024 EXAM: US  Retroperitoneum Complete, Renal. CLINICAL HISTORY: AKI (acute kidney injury) 409830. TECHNIQUE: Real-time ultrasound of the retroperitoneum (complete) with image documentation. COMPARISON: None provided. FINDINGS: RIGHT KIDNEY: Right kidney measures 9.8 x 3.7 x 5.9 cm, with a volume of 111 ml. A 1.1 cm echogenic lesion is identified without significant vascularity. Severe right-sided hydronephrosis is seen. A right-sided stent is seen. No renal stone is visualized. LEFT KIDNEY: Left kidney measures 9.8 x 3.5 x 4.2 cm, with a volume of 75 ml. A 1.3 cm cyst is noted in the upper pole. No hydronephrosis is seen. BLADDER: Bladder is well distended. IMPRESSION: 1. Severe right-sided hydronephrosis. 2. Right ureteral stent in place. 3. Echogenic right renal lesion measuring 1.1 cm without internal vascularity; indeterminate. Recommend nonemergent MRI follow-up 4. Simple cyst in the upper pole of the left kidney measuring 1.3 cm. Electronically signed by: Oneil Devonshire MD  06/06/2024 03:03 PM EDT RP Workstation: [id number]   ECHOCARDIOGRAM COMPLETE Result Date: 05/28/2024    ECHOCARDIOGRAM REPORT   Patient Name:   JOSEALFREDO ADKINS Date of Exam: 05/28/2024 Medical Rec #:  [id number]          Height:       69.0 in Accession #:    [id number]         Weight:       150.0 lb Date of Birth:  1945-12-01           BSA:          1.828 m Patient Age:    78 years           BP:           96/66 mmHg Patient Gender: M                  HR:           69 bpm. Exam Location:  Inpatient Procedure: 2D Echo, Cardiac Doppler, Color Doppler and Intracardiac            Opacification Agent (Both Spectral and Color Flow Doppler were            utilized during procedure). Indications:    CHF I50.31  History:        Patient has prior history of Echocardiogram examinations, most                 recent 07/21/2020. CHF, Arrythmias:Atrial Fibrillation;                 Signs/Symptoms:Shortness of Breath and Edema. H/O Metastatic                 prostate cancer.  Sonographer:    BERNARDA ROCKS Referring Phys: [id number] JUSTIN B HOWERTER IMPRESSIONS  1. Left ventricular ejection fraction, by estimation, is 30 to 35%. The left ventricle has moderately decreased function. The left ventricle demonstrates regional wall motion abnormalities (see scoring diagram/findings for description). The left ventricular internal cavity size was moderately to severely dilated. Left ventricular diastolic parameters are consistent with Grade II diastolic dysfunction (pseudonormalization). There is anterior and septal wall akinesis with hypokinesis elsewhere.  2. Right ventricular systolic function is normal. The right ventricular size is normal.  3. Left  atrial size was moderately dilated.  4. The mitral valve is normal in structure. Trivial mitral valve regurgitation.  5. The aortic valve is tricuspid. Aortic valve regurgitation is not visualized. No aortic stenosis is present. FINDINGS  Left Ventricle: Left ventricular ejection fraction,  by estimation, is 30 to 35%. The left ventricle has moderately decreased function. The left ventricle demonstrates regional wall motion abnormalities. Definity contrast agent was given IV to delineate the left ventricular endocardial borders. The left ventricular internal cavity size was moderately to severely dilated. There is no left ventricular hypertrophy. Left ventricular diastolic parameters are consistent with Grade II diastolic dysfunction (pseudonormalization).  LV Wall Scoring: The entire anterior wall, entire septum, and apex are akinetic. The antero-lateral wall, mid and distal lateral wall, and entire inferior wall are hypokinetic. The basal inferolateral segment is normal. There is anterior and septal wall akinesis with hypokinesis elsewhere. Right Ventricle: The right ventricular size is normal. No increase in right ventricular wall thickness. Right ventricular systolic function is normal. Left Atrium: Left atrial size was moderately dilated. Right Atrium: Right atrial size was normal in size. Pericardium: There is no evidence of pericardial effusion. Mitral Valve: The mitral valve is normal in structure. Trivial mitral valve regurgitation. MV peak gradient, 2.1 mmHg. The mean mitral valve gradient is 1.0 mmHg. Tricuspid Valve: The tricuspid valve is normal in structure. Tricuspid valve regurgitation is trivial. Aortic Valve: The aortic valve is tricuspid. Aortic valve regurgitation is not visualized. No aortic stenosis is present. Aortic valve mean gradient measures 3.0 mmHg. Aortic valve peak gradient measures 6.0 mmHg. Aortic valve area, by VTI measures 2.86 cm. Pulmonic Valve: The pulmonic valve was not well visualized. Pulmonic valve regurgitation is not visualized. Aorta: The aortic root and ascending aorta are structurally normal, with no evidence of dilitation. IAS/Shunts: No atrial level shunt detected by color flow Doppler.  LEFT VENTRICLE PLAX 2D LVIDd:         5.50 cm      Diastology  LVIDs:         4.40 cm      LV e' medial:    6.53 cm/s LV PW:         0.80 cm      LV E/e' medial:  11.5 LV IVS:        0.90 cm      LV e' lateral:   6.74 cm/s LVOT diam:     2.10 cm      LV E/e' lateral: 11.2 LV SV:         55 LV SV Index:   30 LVOT Area:     3.46 cm  LV Volumes (MOD) LV vol d, MOD A2C: 226.0 ml LV vol d, MOD A4C: 181.0 ml LV vol s, MOD A2C: 159.0 ml LV vol s, MOD A4C: 113.0 ml LV SV MOD A2C:     67.0 ml LV SV MOD A4C:     181.0 ml LV SV MOD BP:      72.9 ml RIGHT VENTRICLE             IVC RV Basal diam:  3.10 cm     IVC diam: 1.90 cm RV S prime:     12.40 cm/s TAPSE (M-mode): 2.3 cm LEFT ATRIUM             Index        RIGHT ATRIUM           Index LA diam:  4.50 cm 2.46 cm/m   RA Area:     17.30 cm LA Vol (A2C):   68.7 ml 37.58 ml/m  RA Volume:   49.70 ml  27.18 ml/m LA Vol (A4C):   87.2 ml 47.70 ml/m LA Biplane Vol: 81.4 ml 44.52 ml/m  AORTIC VALVE                    PULMONIC VALVE AV Area (Vmax):    2.45 cm     PV Vmax:          0.68 m/s AV Area (Vmean):   2.62 cm     PV Peak grad:     1.9 mmHg AV Area (VTI):     2.86 cm     PR End Diast Vel: 1.54 msec AV Vmax:           122.00 cm/s AV Vmean:          72.900 cm/s AV VTI:            0.194 m AV Peak Grad:      6.0 mmHg AV Mean Grad:      3.0 mmHg LVOT Vmax:         86.20 cm/s LVOT Vmean:        55.100 cm/s LVOT VTI:          0.160 m LVOT/AV VTI ratio: 0.82  AORTA Ao Root diam: 3.50 cm Ao Asc diam:  3.80 cm MITRAL VALVE MV Area (PHT): 3.23 cm    SHUNTS MV Area VTI:   2.50 cm    Systemic VTI:  0.16 m MV Peak grad:  2.1 mmHg    Systemic Diam: 2.10 cm MV Mean grad:  1.0 mmHg MV Vmax:       0.73 m/s MV Vmean:      46.7 cm/s MV Decel Time: 235 msec MR Peak grad: 32.3 mmHg MR Vmax:      284.00 cm/s MV E velocity: 75.40 cm/s MV A velocity: 53.10 cm/s MV E/A ratio:  1.42 Franck Azobou Tonleu Electronically signed by Joelle Cedars Tonleu Signature Date/Time: 05/28/2024/1:22:52 PM    Final    DG Chest Portable 1 View Result Date:  05/28/2024 CLINICAL DATA:  Hypoxia., . Pt states that he woke up and started having issues breathing. EXAM: PORTABLE CHEST 1 VIEW COMPARISON:  Chest x-ray 07/23/2020 FINDINGS: The heart and mediastinal contours are within normal limits. Diffuse patchy airspace and interstitial opacities. No pleural effusion. No pneumothorax. No acute osseous abnormality. IMPRESSION: Diffuse patchy airspace and interstitial opacities. Electronically Signed   By: Morgane  Naveau M.D.   On: 05/28/2024 01:24    Labs:  CBC: Recent Labs    06/05/24 1834 06/06/24 0341 06/06/24 1450 06/07/24 0618  WBC 7.5 7.1 7.3 6.2  HGB 7.8* 7.0* 7.3* 7.3*  HCT 25.0* 22.0* 23.5* 22.3*  PLT 144* 120* 143* 124*    COAGS: Recent Labs    05/28/24 0639  INR 1.3*  APTT 34    BMP: Recent Labs    05/30/24 0308 06/05/24 1834 06/06/24 0341 06/07/24 0618  NA 140 134* 136 135  K 3.8 6.3* 5.3* 4.5  CL 104 104 105 101  CO2 24 22 22 24   GLUCOSE 87 113* 95 93  BUN 27* 31* 30* 33*  CALCIUM 7.7* 8.6* 8.2* 8.2*  CREATININE 2.04* 2.12* 2.18* 2.34*  GFRNONAA 33* 31* 30* 28*    LIVER FUNCTION TESTS: Recent Labs    05/28/24 0639 06/05/24 1834 06/06/24 0341 06/06/24  1450 06/07/24 0618  BILITOT 1.0 1.5* 1.2 1.1 1.0  AST 29 27 23   --  17  ALT 10 19 18   --  14  ALKPHOS 3,174* 3,465* 3,173*  --  2,905*  PROT 5.8* 6.8 6.0*  --  6.1*  ALBUMIN 2.7* 3.3* 2.9*  --  2.9*    TUMOR MARKERS: No results for input(s): AFPTM, CEA, CA199, CHROMGRNA in the last 8760 hours.  Assessment and Plan:  Scheduled for R Percutaneous nephrostomy drain placement today in IR Risks and benefits of Right PCN placement was discussed with the patient including, but not limited to, infection, bleeding, significant bleeding causing loss or decrease in renal function or damage to adjacent structures.   All of the patient's questions were answered, patient is agreeable to proceed.  Consent signed and in chart.  Thank you for this  interesting consult.  I greatly enjoyed meeting TAEVYN HAUSEN and look forward to participating in their care.  A copy of this report was sent to the requesting provider on this date.  Electronically Signed: Sharlet DELENA Candle, PA-C 06/07/2024, 3:28 PM   I spent a total of 40 Minutes    in face to face in clinical consultation, greater than 50% of which was counseling/coordinating care for Rt PCN

## 2024-06-07 NOTE — Consult Note (Signed)
 Reason for Consult: AKI Referring Physician: Jonel, MD  Joseph Mayo is an 78 y.o. male with a PMH significant for HTN, chronic combined systolic and diastolic CHF, h/o GBS, paroxysmal atrial fibrillation, and recent diagnosis of metastatic prostate cancer (started on Lupron last month) complicated by AKI due to obstruction of right ureter s/p stent placed at the [facility] in September who was told to com to [facility] ED via EMS from [facility] on 05/28/24 with acute onset of SOB.  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  He was diuresed and did well and was discharged to home on 05/30/24.  He presented back to [facility] ED on 06/05/24 after he received a call from his oncologist at the [facility] that his potassium was high and Hgb was low.  In the ED, Temp 97.8, Bp 117/58, HR 64, RR 12, SpO2 98%.  Labs were notable for Hgb of 7.8, K 6.3, Co2 22, BUN 31, Cr 2.12, Ca 8.6, alk phos 3,465, t bili 1.5.  His hyperkalemia was treated medically with insulin/D50, lasix, and lokelma with improvement of his potassium.  We were consulted due to worsening renal function since admission.  He was given IV lasix since admission.  The trend in Scr is seen below.  His Scr has ranged 1.89-2.1 since his hospitalization in August.  Renal US  from today did show severe right sided hydronephrosis and stent in place.  He denies any N/V/D, dysuria, pyuria, hematuria, urgency, frequency, or retention.  No flank pain and no NSAIDs.  Trend in Creatinine: Creatinine, Ser  Date/Time Value Ref Range Status  06/07/2024 06:18 AM 2.34 (H) 0.61 - 1.24 mg/dL Final  89/83/7974 96:58 AM 2.18 (H) 0.61 - 1.24 mg/dL Final  89/84/7974 93:65 PM 2.12 (H) 0.61 - 1.24 mg/dL Final  89/90/7974 96:91 AM 2.04 (H) 0.61 - 1.24 mg/dL Final  89/91/7974 95:93 AM 1.98 (H) 0.61 - 1.24 mg/dL Final  89/92/7974 93:60 AM 1.81 (H) 0.61 - 1.24 mg/dL Final  89/92/7974 87:48 AM 1.80 (H) 0.61 - 1.24 mg/dL  Final  89/92/7974 87:50 AM 1.80 (H) 0.61 - 1.24 mg/dL Final  90/83/7974 8.13 (H)    05/04/2024 1.94 (H)    04/15/2024 2.07 (H)    07/22/2020 03:59 AM 0.64 0.61 - 1.24 mg/dL Final  88/70/7978 87:89 AM 0.78 0.61 - 1.24 mg/dL Final  88/71/7978 96:89 AM 0.74 0.61 - 1.24 mg/dL Final  88/72/7978 90:94 AM 0.80 0.61 - 1.24 mg/dL Final    PMH:   Past Medical History:  Diagnosis Date   Acute on chronic respiratory failure with hypoxia (HCC)    Autonomic dysfunction    CHF (congestive heart failure) (HCC)    Chronic systolic heart failure (HCC)    Guillain Barr syndrome    2021   Paroxysmal atrial fibrillation (HCC)    Prostate cancer metastatic to multiple sites (HCC)    Severe sepsis (HCC)     PSH:  History reviewed. No pertinent surgical history.  Allergies: No Known Allergies  Medications:   Prior to Admission medications   Medication Sig Start Date End Date Taking? Authorizing Provider  calcitRIOL (ROCALTROL) 0.25 MCG capsule Take 0.25 mcg by mouth daily. 05/08/24 06/07/24 Yes [provider]  carvedilol (COREG) 6.25 MG tablet Take 1 tablet (6.25 mg total) by mouth 2 (two) times daily with a meal. 05/30/24  Yes Rashid, Farhan, MD  Cyanocobalamin (B-12 PO) Take 1 tablet by mouth daily.   Yes [provider]  darolutamide (NUBEQA) 300 MG tablet Take 600 mg by mouth 2 (two) times daily with a meal.   Yes [provider]  furosemide (LASIX) 20 MG tablet Take 1 tablet (20 mg total) by mouth daily as needed. 05/30/24 05/30/25 Yes Rashid, Farhan, MD  isosorbide mononitrate (IMDUR) 30 MG 24 hr tablet Take 0.5 tablets (15 mg total) by mouth daily. Patient taking differently: Take 30 mg by mouth daily. 05/30/24  Yes Dino Antu, MD  Omega-3 Fatty Acids (OMEGA 3 PO) Take 1 capsule by mouth daily.   Yes [provider]  OVER THE COUNTER MEDICATION Take 1 Dose by mouth 2 (two) times daily. [place] Tail   Yes [provider]  saw palmetto 160 MG capsule  Take 160 mg by mouth 2 (two) times daily.   Yes [provider]  Turmeric (QC TUMERIC COMPLEX PO) Take 400 mg by mouth 2 (two) times daily.   Yes [provider]    Inpatient medications:  calcitRIOL  0.25 mcg Oral Daily   calcium carbonate  1,000 mg Oral BID WC   carvedilol  6.25 mg Oral BID WC   cyanocobalamin  1,000 mcg Oral Daily   omega-3 acid ethyl esters  1,000 mg Oral Daily   sodium zirconium cyclosilicate  5 g Oral Daily    Discontinued Meds:   Medications Discontinued During This Encounter  Medication Reason   mirtazapine (REMERON) 7.5 MG tablet Patient Preference   atorvastatin (LIPITOR) 40 MG tablet Patient Preference   furosemide (LASIX) injection 40 mg    isosorbide mononitrate (IMDUR) 24 hr tablet 15 mg    darolutamide (NUBEQA) tablet 600 mg    furosemide (LASIX) tablet 40 mg     Social History:  reports that he has never smoked. He has never used smokeless tobacco. He reports that he does not drink alcohol and does not use drugs.  Family History:  History reviewed. No pertinent family history.  Pertinent items are noted in HPI. Weight change:   Intake/Output Summary (Last 24 hours) at 06/07/2024 1219 Last data filed at 06/07/2024 0300 Gross per 24 hour  Intake 310.24 ml  Output 350 ml  Net -39.76 ml   BP (!) 96/57 (BP Location: Left Arm)   Pulse 76   Temp 99 F (37.2 C) (Oral)   Resp 18   Ht 5' 9 (1.753 m)   Wt 67.6 kg   SpO2 98%   BMI 22.01 kg/m  Vitals:   06/06/24 0923 06/06/24 2001 06/06/24 2002 06/07/24 0547  BP: (!) 103/54 (!) 96/45 (!) 93/47 (!) 96/57  Pulse: 70 65 65 76  Resp: 18 18  18   Temp: 98.3 F (36.8 C) 99.2 F (37.3 C) 99.2 F (37.3 C) 99 F (37.2 C)  TempSrc:   Oral Oral  SpO2: 97% 96% 95% 98%  Weight:      Height:         General appearance: alert, cooperative, and no distress Head: Normocephalic, without obvious abnormality, atraumatic Resp: clear to auscultation bilaterally Cardio: regular rate and  rhythm, S1, S2 normal, no murmur, click, rub or gallop GI: soft, non-tender; bowel sounds normal; no masses,  no organomegaly Extremities: extremities normal, atraumatic, no cyanosis or edema  Labs: Basic Metabolic Panel: Recent Labs  Lab 06/05/24 1834 06/06/24 0341 06/06/24 1531 06/07/24 0618  NA 134* 136  --  135  K 6.3* 5.3*  --  4.5  CL 104 105  --  101  CO2 22 22  --  24  GLUCOSE 113* 95  --  93  BUN 31* 30*  --  33*  CREATININE 2.12* 2.18*  --  2.34*  ALBUMIN 3.3* 2.9*  --  2.9*  CALCIUM 8.6* 8.2*  --  8.2*  PHOS  --   --  3.2 3.5   Liver Function Tests: Recent Labs  Lab 06/05/24 1834 06/06/24 0341 06/06/24 1450 06/07/24 0618  AST 27 23  --  17  ALT 19 18  --  14  ALKPHOS 3,465* 3,173*  --  2,905*  BILITOT 1.5* 1.2 1.1 1.0  PROT 6.8 6.0*  --  6.1*  ALBUMIN 3.3* 2.9*  --  2.9*   Recent Labs  Lab 06/05/24 1834  LIPASE 28   No results for input(s): AMMONIA in the last 168 hours. CBC: Recent Labs  Lab 06/05/24 1834 06/06/24 0341 06/06/24 1450 06/07/24 0618  WBC 7.5 7.1 7.3 6.2  NEUTROABS  --   --  3.5 2.9  HGB 7.8* 7.0* 7.3* 7.3*  HCT 25.0* 22.0* 23.5* 22.3*  MCV 100.4* 99.1 101.7* 97.0  PLT 144* 120* 143* 124*   PT/INR: @LABRCNTIP (inr:5) Cardiac Enzymes: )No results for input(s): CKTOTAL, CKMB, CKMBINDEX, TROPONINI in the last 168 hours. CBG: Recent Labs  Lab 06/06/24 0009  GLUCAP 102*    Iron Studies: No results for input(s): IRON, TIBC, TRANSFERRIN, FERRITIN in the last 168 hours.  Xrays/Other Studies: US  RENAL Result Date: 06/06/2024 EXAM: US  Retroperitoneum Complete, Renal. CLINICAL HISTORY: AKI (acute kidney injury) 409830. TECHNIQUE: Real-time ultrasound of the retroperitoneum (complete) with image documentation. COMPARISON: None provided. FINDINGS: RIGHT KIDNEY: Right kidney measures 9.8 x 3.7 x 5.9 cm, with a volume of 111 ml. A 1.1 cm echogenic lesion is identified without significant vascularity. Severe  right-sided hydronephrosis is seen. A right-sided stent is seen. No renal stone is visualized. LEFT KIDNEY: Left kidney measures 9.8 x 3.5 x 4.2 cm, with a volume of 75 ml. A 1.3 cm cyst is noted in the upper pole. No hydronephrosis is seen. BLADDER: Bladder is well distended. IMPRESSION: 1. Severe right-sided hydronephrosis. 2. Right ureteral stent in place. 3. Echogenic right renal lesion measuring 1.1 cm without internal vascularity; indeterminate. Recommend nonemergent MRI follow-up 4. Simple cyst in the upper pole of the left kidney measuring 1.3 cm. Electronically signed by: Oneil Devonshire MD 06/06/2024 03:03 PM EDT RP Workstation: [id number]     Assessment/Plan:  AKI/CKD stage IV - most likely due to ongoing obstruction of the right kidney as well as IV lasix.  Hold lasix for now and will continue to follow UOP and Scr.  Currently without uremic symptoms.  No indication for dialysis at this time.  Will continue to follow.    Avoid nephrotoxic medications including NSAIDs and iodinated intravenous contrast exposure unless the latter is absolutely indicated.   Preferred narcotic agents for pain control are hydromorphone, fentanyl, and methadone. Morphine should not be used.  Avoid Baclofen and avoid oral sodium phosphate and magnesium citrate based laxatives / bowel preps.  Continue strict Input and Output monitoring. Will monitor the patient closely with you and intervene or adjust therapy as indicated by changes in clinical status/labs  Hyperkalemia -resolved.  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  Oncology is following and was on rasburicase.  Continue with lokelma and follow. Severe right hydronephrosis - per discussion with Urology, plan to consult IR for percutaneous nephrostomy tube placement HTN - stable Metastatic prostate cancer - oncology following.  Casodex on hold for now and s/p  2 doses of Eligard (last dose 06/05/24) Normocytic anemia - Oncology following.  Transfuse as  needed. Thrombocytopenia - continue to follow.  Chronic combined systolic and diastolic CHF - hold lasix for now. P. Atrial fibrillation - per primary svc   Fairy DELENA Sellar 06/07/2024, 12:19 PM

## 2024-06-07 NOTE — CV Procedure (Signed)
 Interventional Radiology Procedure Note  Procedure: Right sided PCN placement  Complications: None  Estimated Blood Loss: < 10 mL  Findings: 4fr right sided PCN placed.  Cordella DELENA Banner, MD

## 2024-06-07 NOTE — Progress Notes (Signed)
 Progress Note   Patient: Joseph Mayo FMW:[id number] DOB: 05-Dec-1945 DOA: 06/05/2024     0 DOS: the patient was seen and examined on 06/07/2024 at 9:35AM      Brief hospital course: 78 y.o. M with hx pAF, HTN, sdCHF EF 30-35%, Guillain barre in 2021 and then newly diagnosed prostate Ca metastatic to bone (discovered during admission 2 months ago at [facility] for fall and hip fracture, during which he was discovered to have metastatic prostate CA, anemia requiring transfusions, severe thrombocytopenia and new renal failure and right hydronephrosis with stenting) who now presents with abnormal labs.            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  This improved overnight with Lokelma and Lasix.  Last night uric acid resulted elevated, so oncology has started rasburicase - Continue Lokelma - Hold Lasix - Rasburicase per oncology - Close monitoring of phosphorus, potassium, calcium, uric acid   Elevated creatinine, AKI versus chronic kidney disease, stage 3b (HCC) Creatinine at the [place] was 1.07 in Mar 2024.    2 months ago at admission to [place] for hip fracture, found to have new renal insufficiency.  Creatinine stable since then 1.8-2.1.    Urine acellular here.  Renal ultrasound shows persistent hydronephrosis - Avoid hypotension, nephrotoxins - Consult nephrology, consult urology  - Strict I/Os   Hydronephrosis At the time of admission August 2025), found to have right hydronephrosis.  Stent placed at that time.  [facility], and plan was to exchange the stent in the coming week.  Here, he was noted to have persistent elevated creatinine, persistent right hydronephrosis.  Discussed with nephrology, who requested urology input regarding exchanging stent, percutaneous nephrostomy tube -Consult urology    Essential hypertension Blood pressure soft -Hold Lasix, Coreg,  Imdur - Gentle IV fluids   Prostate cancer metastatic to bone [facility]) Diagnosed this past August.  Started Casodex at that time, has had Eligard x2 (first dose 9/11, second dose 10/15) - Consult Oncology  -Hold Casodex   Normocytic anemia Hemoglobin stable at 7.3, no clinical bleeding observed  During previous hospitalizations, this was considered due to myelosuppression.  He required multiple transfusion during August admission for hip fracture - Consult Hematology, appreciate cares - Trend Hgb    Chronic combined systolic and diastolic CHF (congestive heart failure) (HCC) Appears euvolemic.  See notes from 10/16, reduced EF - Hold Lasix, Imdur, Coreg  Paroxysmal atrial fibrillation (HCC) Diagnosed in 2021 in setting of Guillain barre and associated PE.  On Eliquis for a time, now off. - Hold Coreg for now          Subjective: Patient feels at baseline.  He has had no new symptoms.  Nursing of no concerns.  No clinical bleeding.     Physical Exam: BP (!) 96/57 (BP Location: Left Arm)   Pulse 76   Temp 99 F (37.2 C) (Oral)   Resp 18   Ht 5' 9 (1.753 m)   Wt 67.6 kg   SpO2 98%   BMI 22.01 kg/m   Thin adult male, lying in bed, interactive and appropriate, pale Rate normal, no murmurs, no peripheral edema Respiratory rate normal, lungs clear without rales or wheezes Abdomen soft, no tenderness palpation Attention normal, affect normal, judgment and insight appear normal, oriented x 4, upper extremity strength appears generally normal    Data Reviewed: Cussed with oncology and nephrology Basic metabolic panel shows  creatinine up to 2.3 CBC shows hemoglobin stable at 7.3    Family Communication: daughter    Disposition: Will require ongoing evaluation and treatment of anemia, hyperkalemia, hydronephrosis and renal failure         Author: Lonni SHAUNNA Dalton, MD 06/07/2024 12:38 PM  For on call review [email or web address].

## 2024-06-07 NOTE — Consult Note (Signed)
 I have been asked to see the patient by Dr. Lonni Dalton for evaluation and management of hydronephrosis in setting of metastatic prostate cancer.    History of present illness: 78 yo man with metastatic prostate cancer diagnosed at [facility] 2 months ago.  At that time he was also found to have renal failure and right hydronephrosis managed with indwelling right ureteral stent.  He is currently admitted to medicine for abnormal labs and worsening renal function with hyperkalemia.  Imaging shows right hydronephrosis.  I discussed with hospitalist placement of right-sided nephrostomy tube which he has already had done.  His daughter is at his bedside.  He has been followed by urology at the [facility] and is currently on 70-month Eligard injections and Nubeqa.   Review of systems: A 12 point comprehensive review of systems was obtained and is negative unless otherwise stated in the history of present illness.  Patient Active Problem List   Diagnosis Date Noted   Hyperkalemia 06/05/2024   Prostate cancer metastatic to bone (HCC) 06/05/2024   Essential hypertension 06/05/2024   Chronic combined systolic and diastolic CHF (congestive heart failure) (HCC) 05/28/2024   Alkaline phosphatase elevation 05/28/2024   Chronic kidney disease, stage 3b (HCC) 05/28/2024   Normocytic anemia 05/28/2024   Guillain Barr syndrome    Paroxysmal atrial fibrillation (HCC)    Autonomic dysfunction     No current facility-administered medications on file prior to encounter.   Current Outpatient Medications on File Prior to Encounter  Medication Sig Dispense Refill   calcitRIOL (ROCALTROL) 0.25 MCG capsule Take 0.25 mcg by mouth daily.     carvedilol (COREG) 6.25 MG tablet Take 1 tablet (6.25 mg total) by mouth 2 (two) times daily with a meal. 60 tablet 0   Cyanocobalamin (B-12 PO) Take 1 tablet by mouth daily.     darolutamide (NUBEQA) 300 MG tablet Take 600 mg by mouth 2 (two) times  daily with a meal.     furosemide (LASIX) 20 MG tablet Take 1 tablet (20 mg total) by mouth daily as needed. 30 tablet 0   isosorbide mononitrate (IMDUR) 30 MG 24 hr tablet Take 0.5 tablets (15 mg total) by mouth daily. (Patient taking differently: Take 30 mg by mouth daily.) 30 tablet 0   Omega-3 Fatty Acids (OMEGA 3 PO) Take 1 capsule by mouth daily.     OVER THE COUNTER MEDICATION Take 1 Dose by mouth 2 (two) times daily. [place] Tail     saw palmetto 160 MG capsule Take 160 mg by mouth 2 (two) times daily.     Turmeric (QC TUMERIC COMPLEX PO) Take 400 mg by mouth 2 (two) times daily.      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 No pertinent surgical history.  Social History   Tobacco Use   Smoking status: Never   Smokeless tobacco: Never  Substance Use Topics   Alcohol use: Never   Drug use: Never    History reviewed. No pertinent family history.  PE: Vitals:   06/06/24 0923 06/06/24 2001 06/06/24 2002 06/07/24 0547  BP: ROLLEN)  103/54 (!) 96/45 (!) 93/47 (!) 96/57  Pulse: 70 65 65 76  Resp: 18 18  18   Temp: 98.3 F (36.8 C) 99.2 F (37.3 C) 99.2 F (37.3 C) 99 F (37.2 C)  TempSrc:   Oral Oral  SpO2: 97% 96% 95% 98%  Weight:      Height:       Patient appears to be in no acute distress  patient is alert and oriented x3 Atraumatic normocephalic head No cervical or supraclavicular lymphadenopathy appreciated No increased work of breathing, no audible wheezes/rhonchi Regular sinus rhythm/rate Right sided nephrostomy tube with dark red urine in tubing Bedside urinal with tea colored urine Lower extremities are symmetric without  appreciable edema Grossly neurologically intact No identifiable skin lesions  Recent Labs    06/06/24 0341 06/06/24 1450 06/07/24 0618  WBC 7.1 7.3 6.2  HGB 7.0* 7.3* 7.3*  HCT 22.0* 23.5* 22.3*   Recent Labs    06/05/24 1834 06/06/24 0341 06/07/24 0618  NA 134* 136 135  K 6.3* 5.3* 4.5  CL 104 105 101  CO2 22 22 24   GLUCOSE 113* 95 93  BUN 31* 30* 33*  CREATININE 2.12* 2.18* 2.34*  CALCIUM 8.6* 8.2* 8.2*   No results for input(s): LABPT, INR in the last 72 hours. No results for input(s): LABURIN in the last 72 hours. Results for orders placed or performed during the hospital encounter of 05/28/24  Resp panel by RT-PCR (RSV, Flu A&B, Covid) Anterior Nasal Swab     Status: None   Collection Time: 05/28/24  1:41 AM   Specimen: Anterior Nasal Swab  Result Value Ref Range Status   SARS Coronavirus 2 by RT PCR NEGATIVE NEGATIVE Final   Influenza A by PCR NEGATIVE NEGATIVE Final   Influenza B by PCR NEGATIVE NEGATIVE Final    Comment: (NOTE) The Xpert Xpress SARS-CoV-2/FLU/RSV plus assay is intended as an aid in the diagnosis of influenza from Nasopharyngeal swab specimens and should not be used as a sole basis for treatment. Nasal washings and aspirates are unacceptable for Xpert Xpress SARS-CoV-2/FLU/RSV testing.  Fact Sheet for Patients: BloggerCourse.com  Fact Sheet for Healthcare Providers: SeriousBroker.it  This test is not yet approved or cleared by the [place]  FDA and has been authorized for detection and/or diagnosis of SARS-CoV-2 by FDA under an Emergency Use Authorization (EUA). This EUA will remain in effect (meaning this test can be used) for the duration of the COVID-19 declaration under Section 564(b)(1) of the Act, 21 U.S.C. section 360bbb-3(b)(1), unless the authorization is terminated or revoked.     Resp Syncytial Virus by PCR NEGATIVE NEGATIVE Final    Comment: (NOTE) Fact Sheet for  Patients: BloggerCourse.com  Fact Sheet for Healthcare Providers: SeriousBroker.it  This test is not yet approved or cleared by the [place]  FDA and has been authorized for detection and/or diagnosis of SARS-CoV-2 by FDA under an Emergency Use Authorization (EUA). This EUA will remain in effect (meaning this test can be used) for the duration of the COVID-19 declaration under Section 564(b)(1) of the Act, 21 U.S.C. section 360bbb-3(b)(1), unless the authorization is terminated or revoked.  Performed at [facility], [address] [place], [place] [address]     Imaging: Renal US  06/07/24 IMPRESSION: 1. Severe right-sided hydronephrosis. 2. Right ureteral stent in place. 3. Echogenic right renal lesion measuring 1.1 cm without internal vascularity; indeterminate. Recommend nonemergent MRI follow-up 4. Simple cyst in the upper pole of the left kidney measuring 1.3 cm.  Electronically signed by: Oneil Devonshire MD 06/06/2024 03:03 PM EDT RP Workstation: [id number]    Assessment/Plan: Metastatic prostate cancer: -managed by [facility] urology currently on Eligard and Nubeqa  Right hydronephrosis: -stent has failed in setting of extrinsic compression -recommend placement of right PCN tube by IR -right ureteral stent can be removed by patient's urologist in future    [facility] D Jacyln Carmer

## 2024-06-08 DIAGNOSIS — E875 Hyperkalemia: Secondary | ICD-10-CM | POA: Diagnosis not present

## 2024-06-08 LAB — RENAL FUNCTION PANEL
Albumin: 2.6 g/dL — ABNORMAL LOW (ref 3.5–5.0)
Anion gap: 9 (ref 5–15)
BUN: 41 mg/dL — ABNORMAL HIGH (ref 8–23)
CO2: 22 mmol/L (ref 22–32)
Calcium: 7.8 mg/dL — ABNORMAL LOW (ref 8.9–10.3)
Chloride: 104 mmol/L (ref 98–111)
Creatinine, Ser: 2.31 mg/dL — ABNORMAL HIGH (ref 0.61–1.24)
GFR, Estimated: 28 mL/min — ABNORMAL LOW (ref >60)
Glucose, Bld: 104 mg/dL — ABNORMAL HIGH (ref 70–99)
Phosphorus: 3.3 mg/dL (ref 2.5–4.6)
Potassium: 4.5 mmol/L (ref 3.5–5.1)
Sodium: 135 mmol/L (ref 135–145)

## 2024-06-08 LAB — CBC
HCT: 22 % — ABNORMAL LOW (ref 39.0–52.0)
Hemoglobin: 6.9 g/dL — CL (ref 13.0–17.0)
MCH: 31.2 pg (ref 26.0–34.0)
MCHC: 31.4 g/dL (ref 30.0–36.0)
MCV: 99.5 fL (ref 80.0–100.0)
Platelets: 127 K/uL — ABNORMAL LOW (ref 150–400)
RBC: 2.21 MIL/uL — ABNORMAL LOW (ref 4.22–5.81)
RDW: 17.1 % — ABNORMAL HIGH (ref 11.5–15.5)
WBC: 6.6 K/uL (ref 4.0–10.5)
nRBC: 0 % (ref 0.0–0.2)

## 2024-06-08 LAB — LACTATE DEHYDROGENASE: LDH: 180 U/L (ref 98–192)

## 2024-06-08 LAB — HAPTOGLOBIN: Haptoglobin: 78 mg/dL (ref 34–355)

## 2024-06-08 LAB — HEMOGLOBIN AND HEMATOCRIT, BLOOD
HCT: 23.4 % — ABNORMAL LOW (ref 39.0–52.0)
Hemoglobin: 7.3 g/dL — ABNORMAL LOW (ref 13.0–17.0)

## 2024-06-08 LAB — MAGNESIUM: Magnesium: 2.4 mg/dL (ref 1.7–2.4)

## 2024-06-08 LAB — URIC ACID: Uric Acid, Serum: <1.5 mg/dL — ABNORMAL LOW (ref 3.7–8.6)

## 2024-06-08 MED ORDER — DAROLUTAMIDE 300 MG PO TABS
600.0000 mg | ORAL_TABLET | Freq: Two times a day (BID) | ORAL | Status: DC
  Administered 2024-06-09 – 2024-06-10 (×2): 600 mg via ORAL
  Filled 2024-06-08 (×4): qty 2

## 2024-06-08 NOTE — Progress Notes (Signed)
 Patiens family notified to bring NUBEQA medication from home. Patient stated they will bring this medication tomorrow.

## 2024-06-08 NOTE — Progress Notes (Signed)
 Patient ID: ORI TREJOS, male   DOB: [birth year], 78 y.o.   MRN: [id number] S: no new complaints.  Had right PCN placed by IR yesterday. O:BP (!) 103/50 (BP Location: Left Arm)   Pulse 65   Temp 98 F (36.7 C) (Oral)   Resp 19   Ht 5' 9 (1.753 m)   Wt 67.6 kg   SpO2 99%   BMI 22.01 kg/m   Intake/Output Summary (Last 24 hours) at 06/08/2024 1229 Last data filed at 06/08/2024 0838 Gross per 24 hour  Intake 240 ml  Output 880 ml  Net -640 ml   Intake/Output: I/O last 3 completed shifts: In: 550.2 [P.O.:240; I.V.:310.2] Out: 1100 [Urine:1100]  Intake/Output this shift:  Total I/O In: -  Out: 130 [Urine:70; Blood:60] Weight change:  Gen: NAD CVS: RRR Resp:CTA Abd: +BS, soft, NT/ND Back:  RPCN in place with bloody fluid in bag Ext: no edema  Recent Labs  Lab 06/05/24 1834 06/06/24 0341 06/06/24 1531 06/07/24 0618 06/08/24 0344  NA 134* 136  --  135 135  K 6.3* 5.3*  --  4.5 4.5  CL 104 105  --  101 104  CO2 22 22  --  24 22  GLUCOSE 113* 95  --  93 104*  BUN 31* 30*  --  33* 41*  CREATININE 2.12* 2.18*  --  2.34* 2.31*  ALBUMIN 3.3* 2.9*  --  2.9* 2.6*  CALCIUM 8.6* 8.2*  --  8.2* 7.8*  PHOS  --   --  3.2 3.5 3.3  AST 27 23  --  17  --   ALT 19 18  --  14  --    Liver Function Tests: Recent Labs  Lab 06/05/24 1834 06/06/24 0341 06/06/24 1450 06/07/24 0618 06/08/24 0344  AST 27 23  --  17  --   ALT 19 18  --  14  --   ALKPHOS 3,465* 3,173*  --  2,905*  --   BILITOT 1.5* 1.2 1.1 1.0  --   PROT 6.8 6.0*  --  6.1*  --   ALBUMIN 3.3* 2.9*  --  2.9* 2.6*   Recent Labs  Lab 06/05/24 1834  LIPASE 28   No results for input(s): AMMONIA in the last 168 hours. CBC: Recent Labs  Lab 06/05/24 1834 06/06/24 0341 06/06/24 1450 06/07/24 0618 06/08/24 0344 06/08/24 0706  WBC 7.5 7.1 7.3 6.2 6.6  --   NEUTROABS  --   --  3.5 2.9  --   --   HGB 7.8* 7.0* 7.3* 7.3* 6.9* 7.3*  HCT 25.0* 22.0* 23.5* 22.3* 22.0* 23.4*  MCV 100.4* 99.1 101.7* 97.0 99.5   --   PLT 144* 120* 143* 124* 127*  --    Cardiac Enzymes: No results for input(s): CKTOTAL, CKMB, CKMBINDEX, TROPONINI in the last 168 hours. CBG: Recent Labs  Lab 06/06/24 0009  GLUCAP 102*    Iron Studies: No results for input(s): IRON, TIBC, TRANSFERRIN, FERRITIN in the last 72 hours. Studies/Results: IR NEPHROSTOMY PLACEMENT RIGHT Result Date: 06/07/2024 INDICATION: Right-sided hydronephrosis. Obstruction of the right ureter. Indwelling ureteral stent not controlling hydronephrosis and worsening renal function. EXAM: Percutaneous nephrostomy tube placement COMPARISON:  None Available. MEDICATIONS: 2 g Rocephin; The antibiotic was administered in an appropriate time frame prior to skin puncture. ANESTHESIA/SEDATION: Moderate (conscious) sedation was employed during this procedure. A total of Versed 1.5 mg and Fentanyl 0 mcg was administered intravenously by the radiology nurse. Total intra-service moderate Sedation Time: 11  minutes. The patient's level of consciousness and vital signs were monitored continuously by radiology nursing throughout the procedure under my direct supervision. CONTRAST:  20 mL Omnipaque 300-administered into the collecting system(s) FLUOROSCOPY: Radiation Exposure Index (as provided by the fluoroscopic device): 6 mGy Kerma COMPLICATIONS: None immediate. PROCEDURE: Informed written consent was obtained from the patient after a thorough discussion of the procedural risks, benefits and alternatives. All questions were addressed. Maximal Sterile Barrier Technique was utilized including caps, mask, sterile gowns, sterile gloves, sterile drape, hand hygiene and skin antiseptic. A timeout was performed prior to the initiation of the procedure. In a prone position on the IR table, the right flank region was prepped and draped in usual sterile fashion. Local anesthesia was achieved with 1 percent lidocaine. Small incision was made in the patient's flank region.  Chiba needle was advanced with ultrasound guidance into the lower pole collecting system. Under fluoroscopic guidance the stylet was removed and dilute contrast was injected demonstrating position within the renal collecting system. An 018 guidewire was then advanced into the renal pelvis under fluoroscopic guidance. Access was exchanged for a low-profile nephrostomy sheath. Access was then exchanged over an 035 short Amplatz wire for a fascial dilator to dilate the tract. A 10 French pigtail catheter was then advanced over the guidewire and coiled within the renal pelvis. Urine obtained was black in color and sent for culture and sensitivity. Catheter locking mechanism engaged. Sterile dressing applied. Retention suture applied. Catheter connected to gravity drainage. IMPRESSION: Satisfactory placement of a 10 French percutaneous right-sided nephrostomy tube. Electronically Signed   By: Cordella Banner   On: 06/07/2024 16:55    calcitRIOL  0.25 mcg Oral Daily   calcium carbonate  1,000 mg Oral BID WC   cyanocobalamin  1,000 mcg Oral Daily   omega-3 acid ethyl esters  1,000 mg Oral Daily   sodium chloride flush  5 mL Intracatheter Q8H    BMET    Component Value Date/Time   NA 135 06/08/2024 0344   K 4.5 06/08/2024 0344   CL 104 06/08/2024 0344   CO2 22 06/08/2024 0344   GLUCOSE 104 (H) 06/08/2024 0344   BUN 41 (H) 06/08/2024 0344   CREATININE 2.31 (H) 06/08/2024 0344   CALCIUM 7.8 (L) 06/08/2024 0344   GFRNONAA 28 (L) 06/08/2024 0344   CBC    Component Value Date/Time   WBC 6.6 06/08/2024 0344   RBC 2.21 (L) 06/08/2024 0344   HGB 7.3 (L) 06/08/2024 0706   HCT 23.4 (L) 06/08/2024 0706   PLT 127 (L) 06/08/2024 0344   MCV 99.5 06/08/2024 0344   MCH 31.2 06/08/2024 0344   MCHC 31.4 06/08/2024 0344   RDW 17.1 (H) 06/08/2024 0344   LYMPHSABS 2.0 06/07/2024 0618   MONOABS 0.6 06/07/2024 0618   EOSABS 0.6 (H) 06/07/2024 0618   BASOSABS 0.0 06/07/2024 0618     Assessment/Plan:   AKI/CKD stage IV - most likely due to ongoing obstruction of the right kidney as well as IV lasix.  Hold lasix for now and will continue to follow UOP and Scr.  Currently without uremic symptoms.  No indication for dialysis at this time.  Will continue to follow.  No significant improvement after right PCN and only minimal bloody drainage in bag.  No uremic symptoms.  May be at new baseline.  Continue to follow for now     Avoid nephrotoxic medications including NSAIDs and iodinated intravenous contrast exposure unless the latter is absolutely indicated.   Preferred  narcotic agents for pain control are hydromorphone, fentanyl, and methadone. Morphine should not be used.  Avoid Baclofen and avoid oral sodium phosphate and magnesium citrate based laxatives / bowel preps.  Continue strict Input and Output monitoring. Will monitor the patient closely with you and intervene or adjust therapy as indicated by changes in clinical status/labs  Hyperkalemia -resolved.  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  Oncology is following and was on rasburicase.  Continue with lokelma and follow. Severe right hydronephrosis - s/p IR placement of right percutaneous nephrostomy tube 06/07/24.  Only minimal bloody fluid collection in bag this am.  HTN - stable Metastatic prostate cancer - oncology following.  Casodex on hold for now and s/p 2 doses of Eligard (last dose 06/05/24) Normocytic anemia - Oncology following.  Transfuse as needed. Thrombocytopenia - continue to follow.  Chronic combined systolic and diastolic CHF - hold lasix for now. P. Atrial fibrillation - per primary svc  Fairy RONAL Sellar, MD [facility]

## 2024-06-08 NOTE — Plan of Care (Signed)
  Problem: Health Behavior/Discharge Planning: Goal: Ability to manage health-related needs will improve Outcome: Progressing   Problem: Clinical Measurements: Goal: Cardiovascular complication will be avoided Outcome: Progressing   Problem: Activity: Goal: Risk for activity intolerance will decrease Outcome: Progressing   

## 2024-06-08 NOTE — Progress Notes (Signed)
 500 ml dark red output from the new right nephrostomy tube, dressing is intact, pt hgb 6.9 doctor notified, new orders have been placed, Vitals are stable   [employer]abin crew

## 2024-06-08 NOTE — Progress Notes (Signed)
  Progress Note   Patient: Joseph Mayo FMW:[id number] DOB: 1946/04/17 DOA: 06/05/2024     1 DOS: the patient was seen and examined on 06/08/2024 at 12:20PM      Brief hospital course: 78 y.o. 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            Assessment and Plan: * Hyperkalemia Admitted, urate >14 and Oncology consulted given concern for tumor lysis syndrome.  Rasburicase x1.  Urate now normal, K normalized with rasburicase as well as Lasix and Lokelma.  K, Ca, Phos and mag normal today. 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  Urology and Nephrology consulted yesterday.  Given persistent hydro, persistent renal insufficiency, and low success rate of stenting for malignant hydronephrosis, PCN recommended, completed 10/17 Overnight, scant reddish fluid in drainage bag, Cr unchanged. - Hold Lasix, Imdur, Coreg - Avoid hypotension, nephrotoxins - Consult nephrology  - Strict I/Os       Essential hypertension BP remain soft - Hold Lasix, Coreg, isosorbide     Prostate cancer metastatic to bone [facility]) Discussed with Oncology today regarding resumption of Nubeqa, they favor it. - Resume darolutamide and monitor K   Normocytic anemia Hgb 6.7 transiently overnight, repeat again >7 - Trend Hgb  - Transfusion threshold 7 g/dl   Chronic combined systolic and diastolic CHF (congestive heart failure) (HCC) Appears euvolemic.  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  On Eliquis for a time, now off. - Hold Coreg for now                Subjective: Patient feels okay, he is eating lunch with his daughter.  He has had no fever, he has no respiratory symptoms.  He has been up for a walk.     Physical Exam: BP (!) 107/45 (BP Location: Left Arm)   Pulse (!) 56   Temp 98.8 F (37.1 C) (Oral)   Resp 19   Ht 5' 9 (1.753 m)   Wt 67.6 kg   SpO2 100%   BMI 22.01 kg/m   Thin adult male, somewhat pale, lying in bed, interactive and appropriate RRR, no murmurs, no peripheral edema Respiratory rate normal, lungs clear without rales or wheezes Abdomen soft, no tenderness palpation or guarding, no ascites or distention Attention normal, affect normal, judgment and insight appear normal    Data Reviewed: Discussed with oncology Basic metabolic panel shows creatinine stable at 2.3, electrolytes normal CBC shows hemoglobin transiently dipping to 6.9, then back to 7.3 Platelets 127    Family Communication: Daughter at the bedside    Disposition: Status is: Inpatient         Author: Lonni SHAUNNA Dalton, MD 06/08/2024 5:12 PM  For on call review [email or web address].

## 2024-06-09 ENCOUNTER — Inpatient Hospital Stay (HOSPITAL_COMMUNITY)

## 2024-06-09 DIAGNOSIS — E875 Hyperkalemia: Secondary | ICD-10-CM | POA: Diagnosis not present

## 2024-06-09 LAB — RENAL FUNCTION PANEL
Albumin: 2.4 g/dL — ABNORMAL LOW (ref 3.5–5.0)
Anion gap: 10 (ref 5–15)
BUN: 41 mg/dL — ABNORMAL HIGH (ref 8–23)
CO2: 22 mmol/L (ref 22–32)
Calcium: 8.4 mg/dL — ABNORMAL LOW (ref 8.9–10.3)
Chloride: 103 mmol/L (ref 98–111)
Creatinine, Ser: 2.08 mg/dL — ABNORMAL HIGH (ref 0.61–1.24)
GFR, Estimated: 32 mL/min — ABNORMAL LOW (ref >60)
Glucose, Bld: 93 mg/dL (ref 70–99)
Phosphorus: 3.7 mg/dL (ref 2.5–4.6)
Potassium: 4.6 mmol/L (ref 3.5–5.1)
Sodium: 135 mmol/L (ref 135–145)

## 2024-06-09 LAB — HEMOGLOBIN AND HEMATOCRIT, BLOOD
HCT: 20.4 % — ABNORMAL LOW (ref 39.0–52.0)
Hemoglobin: 6.5 g/dL — CL (ref 13.0–17.0)

## 2024-06-09 LAB — MAGNESIUM: Magnesium: 2.3 mg/dL (ref 1.7–2.4)

## 2024-06-09 LAB — FOLATE: Folate: 13.4 ng/mL (ref >5.9)

## 2024-06-09 LAB — CBC
HCT: 21.4 % — ABNORMAL LOW (ref 39.0–52.0)
Hemoglobin: 6.7 g/dL — CL (ref 13.0–17.0)
MCH: 31.2 pg (ref 26.0–34.0)
MCHC: 31.3 g/dL (ref 30.0–36.0)
MCV: 99.5 fL (ref 80.0–100.0)
Platelets: 125 K/uL — ABNORMAL LOW (ref 150–400)
RBC: 2.15 MIL/uL — ABNORMAL LOW (ref 4.22–5.81)
RDW: 16.8 % — ABNORMAL HIGH (ref 11.5–15.5)
WBC: 5.6 K/uL (ref 4.0–10.5)
nRBC: 0 % (ref 0.0–0.2)

## 2024-06-09 LAB — PREPARE RBC (CROSSMATCH)

## 2024-06-09 LAB — VITAMIN B12: Vitamin B-12: 600 pg/mL (ref 180–914)

## 2024-06-09 MED ORDER — ALLOPURINOL 100 MG PO TABS
100.0000 mg | ORAL_TABLET | Freq: Every day | ORAL | Status: DC
Start: 2024-06-09 — End: 2024-06-10
  Administered 2024-06-09 – 2024-06-10 (×2): 100 mg via ORAL
  Filled 2024-06-09 (×2): qty 1

## 2024-06-09 MED ORDER — SODIUM CHLORIDE 0.9% IV SOLUTION: Freq: Once | INTRAVENOUS | Status: AC

## 2024-06-09 NOTE — Progress Notes (Addendum)
 Patient ID: Joseph Mayo, male   DOB: 1946-01-25, 78 y.o.   MRN: [id number] S: No new complaints O:BP (!) 100/57   Pulse (!) 57   Temp 97.9 F (36.6 C)   Resp 17   Ht 5' 9 (1.753 m)   Wt 67.6 kg   SpO2 96%   BMI 22.01 kg/m   Intake/Output Summary (Last 24 hours) at 06/09/2024 1105 Last data filed at 06/09/2024 0545 Gross per 24 hour  Intake 10 ml  Output 350 ml  Net -340 ml   Intake/Output: I/O last 3 completed shifts: In: 10 [I.V.:10] Out: 830 [Urine:420; Blood:410]  Intake/Output this shift:  No intake/output data recorded. Weight change:  Gen: NAD CVS: RRR Resp:CTA Abd: +BS, soft, NT/ND Ext: no edema  Recent Labs  Lab 06/05/24 1834 06/06/24 0341 06/06/24 1531 06/07/24 0618 06/08/24 0344 06/09/24 0435  NA 134* 136  --  135 135 135  K 6.3* 5.3*  --  4.5 4.5 4.6  CL 104 105  --  101 104 103  CO2 22 22  --  24 22 22   GLUCOSE 113* 95  --  93 104* 93  BUN 31* 30*  --  33* 41* 41*  CREATININE 2.12* 2.18*  --  2.34* 2.31* 2.08*  ALBUMIN 3.3* 2.9*  --  2.9* 2.6* 2.4*  CALCIUM 8.6* 8.2*  --  8.2* 7.8* 8.4*  PHOS  --   --  3.2 3.5 3.3 3.7  AST 27 23  --  17  --   --   ALT 19 18  --  14  --   --    Liver Function Tests: Recent Labs  Lab 06/05/24 1834 06/06/24 0341 06/06/24 1450 06/07/24 0618 06/08/24 0344 06/09/24 0435  AST 27 23  --  17  --   --   ALT 19 18  --  14  --   --   ALKPHOS 3,465* 3,173*  --  2,905*  --   --   BILITOT 1.5* 1.2 1.1 1.0  --   --   PROT 6.8 6.0*  --  6.1*  --   --   ALBUMIN 3.3* 2.9*  --  2.9* 2.6* 2.4*   Recent Labs  Lab 06/05/24 1834  LIPASE 28   No results for input(s): AMMONIA in the last 168 hours. CBC: Recent Labs  Lab 06/06/24 0341 06/06/24 1450 06/07/24 0618 06/08/24 0344 06/08/24 0706 06/09/24 0435 06/09/24 0557  WBC 7.1 7.3 6.2 6.6  --  5.6  --   NEUTROABS  --  3.5 2.9  --   --   --   --   HGB 7.0* 7.3* 7.3* 6.9* 7.3* 6.7* 6.5*  HCT 22.0* 23.5* 22.3* 22.0* 23.4* 21.4* 20.4*  MCV 99.1 101.7* 97.0  99.5  --  99.5  --   PLT 120* 143* 124* 127*  --  125*  --    Cardiac Enzymes: No results for input(s): CKTOTAL, CKMB, CKMBINDEX, TROPONINI in the last 168 hours. CBG: Recent Labs  Lab 06/06/24 0009  GLUCAP 102*    Iron Studies: No results for input(s): IRON, TIBC, TRANSFERRIN, FERRITIN in the last 72 hours. Studies/Results: IR NEPHROSTOMY PLACEMENT RIGHT Result Date: 06/07/2024 INDICATION: Right-sided hydronephrosis. Obstruction of the right ureter. Indwelling ureteral stent not controlling hydronephrosis and worsening renal function. EXAM: Percutaneous nephrostomy tube placement COMPARISON:  None Available. MEDICATIONS: 2 g Rocephin; The antibiotic was administered in an appropriate time frame prior to skin puncture. ANESTHESIA/SEDATION: Moderate (conscious) sedation was employed during  this procedure. A total of Versed 1.5 mg and Fentanyl 0 mcg was administered intravenously by the radiology nurse. Total intra-service moderate Sedation Time: 11 minutes. The patient's level of consciousness and vital signs were monitored continuously by radiology nursing throughout the procedure under my direct supervision. CONTRAST:  20 mL Omnipaque 300-administered into the collecting system(s) FLUOROSCOPY: Radiation Exposure Index (as provided by the fluoroscopic device): 6 mGy Kerma COMPLICATIONS: None immediate. PROCEDURE: Informed written consent was obtained from the patient after a thorough discussion of the procedural risks, benefits and alternatives. All questions were addressed. Maximal Sterile Barrier Technique was utilized including caps, mask, sterile gowns, sterile gloves, sterile drape, hand hygiene and skin antiseptic. A timeout was performed prior to the initiation of the procedure. In a prone position on the IR table, the right flank region was prepped and draped in usual sterile fashion. Local anesthesia was achieved with 1 percent lidocaine. Small incision was made in the  patient's flank region. Chiba needle was advanced with ultrasound guidance into the lower pole collecting system. Under fluoroscopic guidance the stylet was removed and dilute contrast was injected demonstrating position within the renal collecting system. An 018 guidewire was then advanced into the renal pelvis under fluoroscopic guidance. Access was exchanged for a low-profile nephrostomy sheath. Access was then exchanged over an 035 short Amplatz wire for a fascial dilator to dilate the tract. A 10 French pigtail catheter was then advanced over the guidewire and coiled within the renal pelvis. Urine obtained was black in color and sent for culture and sensitivity. Catheter locking mechanism engaged. Sterile dressing applied. Retention suture applied. Catheter connected to gravity drainage. IMPRESSION: Satisfactory placement of a 10 French percutaneous right-sided nephrostomy tube. Electronically Signed   By: Cordella Banner   On: 06/07/2024 16:55    [START ON 06/10/2024] allopurinol  100 mg Oral Daily   calcitRIOL  0.25 mcg Oral Daily   calcium carbonate  1,000 mg Oral BID WC   cyanocobalamin  1,000 mcg Oral Daily   darolutamide  600 mg Oral BID WC   omega-3 acid ethyl esters  1,000 mg Oral Daily   sodium chloride flush  5 mL Intracatheter Q8H    BMET    Component Value Date/Time   NA 135 06/09/2024 0435   K 4.6 06/09/2024 0435   CL 103 06/09/2024 0435   CO2 22 06/09/2024 0435   GLUCOSE 93 06/09/2024 0435   BUN 41 (H) 06/09/2024 0435   CREATININE 2.08 (H) 06/09/2024 0435   CALCIUM 8.4 (L) 06/09/2024 0435   GFRNONAA 32 (L) 06/09/2024 0435   CBC    Component Value Date/Time   WBC 5.6 06/09/2024 0435   RBC 2.15 (L) 06/09/2024 0435   HGB 6.5 (LL) 06/09/2024 0557   HCT 20.4 (L) 06/09/2024 0557   PLT 125 (L) 06/09/2024 0435   MCV 99.5 06/09/2024 0435   MCH 31.2 06/09/2024 0435   MCHC 31.3 06/09/2024 0435   RDW 16.8 (H) 06/09/2024 0435   LYMPHSABS 2.0 06/07/2024 0618   MONOABS 0.6  06/07/2024 0618   EOSABS 0.6 (H) 06/07/2024 0618   BASOSABS 0.0 06/07/2024 0618    Assessment/Plan:  AKI/CKD stage IV - most likely due to ongoing obstruction of the right kidney as well as IV lasix.  Hold lasix for now and will continue to follow UOP and Scr.  Currently without uremic symptoms.  No indication for dialysis at this time.  Will continue to follow.  No significant improvement after right PCN and only minimal  bloody drainage in bag.  No uremic symptoms.  Cr slowly improving toward his baseline of 1.8.  Continue to follow for now   Only 70 ml documented for UOP but likely not accurate given improving Scr.  He is followed by [facility] and is scheduled to follow up soon.  Avoid nephrotoxic medications including NSAIDs and iodinated intravenous contrast exposure unless the latter is absolutely indicated.   Preferred narcotic agents for pain control are hydromorphone, fentanyl, and methadone. Morphine should not be used.  Avoid Baclofen and avoid oral sodium phosphate and magnesium citrate based laxatives / bowel preps.  Continue strict Input and Output monitoring. Will monitor the patient closely with you and intervene or adjust therapy as indicated by changes in clinical status/labs  Hyperkalemia -resolved.  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  Oncology is following and was on rasburicase.  Continue with lokelma and follow. Severe right hydronephrosis - s/p IR placement of right percutaneous nephrostomy tube 06/07/24.  Only minimal bloody fluid collection in bag this am.  HTN - stable Metastatic prostate cancer - oncology following.  Casodex on hold for now and s/p 2 doses of Eligard (last dose 06/05/24) Normocytic anemia - Oncology following.  Transfuse as needed.  Plan for transfusion today. Thrombocytopenia - continue to follow.  Chronic combined systolic and diastolic CHF - hold lasix for now. P. Atrial fibrillation - per primary svc  Fairy RONAL Sellar, MD [facility]

## 2024-06-09 NOTE — Progress Notes (Addendum)
 Referring Physician(s): Dr. Jonel  Supervising Physician: Karalee Beat  Patient Status:  [facility] - In-pt  Chief Complaint: R hydropnephrosis  Subjective: Resting comfortably in bed.   Finishing up an Ensure.  Reports no change in how he feels since R PCN placement.  R PCN in place with bloody output.   Allergies: Patient has no known allergies.  Medications: Prior to Admission medications   Medication Sig Start Date End Date Taking? Authorizing Provider  carvedilol (COREG) 6.25 MG tablet Take 1 tablet (6.25 mg total) by mouth 2 (two) times daily with a meal. 05/30/24  Yes Rashid, Farhan, MD  Cyanocobalamin (B-12 PO) Take 1 tablet by mouth daily.   Yes [provider]  darolutamide (NUBEQA) 300 MG tablet Take 600 mg by mouth 2 (two) times daily with a meal.   Yes [provider]  furosemide (LASIX) 20 MG tablet Take 1 tablet (20 mg total) by mouth daily as needed. 05/30/24 05/30/25 Yes Rashid, Farhan, MD  isosorbide mononitrate (IMDUR) 30 MG 24 hr tablet Take 0.5 tablets (15 mg total) by mouth daily. Patient taking differently: Take 30 mg by mouth daily. 05/30/24  Yes Dino Antu, MD  Omega-3 Fatty Acids (OMEGA 3 PO) Take 1 capsule by mouth daily.   Yes [provider]  OVER THE COUNTER MEDICATION Take 1 Dose by mouth 2 (two) times daily. [place] T[place]   Yes [provider]  saw palmetto 160 MG capsule Take 160 mg by mouth 2 (two) times daily.   Yes [provider]  Turmeric (QC TUMERIC COMPLEX PO) Take 400 mg by mouth 2 (two) times daily.   Yes [provider]     Vital Signs: BP (!) 100/57   Pulse (!) 57   Temp 97.9 F (36.6 C)   Resp 17   Ht 5' 9 (1.753 m)   Wt 149 lb 0.5 oz (67.6 kg)   SpO2 96%   BMI 22.01 kg/m   Physical Exam NAD, alert Abdomen: soft, non-distended Flank: R PCN in place.  Insertion site intact.  Dressing dry.  Bloody output noted in gravity bag.  No clotting, however thick heme sediment  noted in bottom of bag.   Imaging: IR NEPHROSTOMY PLACEMENT RIGHT Result Date: 06/07/2024 INDICATION: Right-sided hydronephrosis. Obstruction of the right ureter. Indwelling ureteral stent not controlling hydronephrosis and worsening renal function. EXAM: Percutaneous nephrostomy tube placement COMPARISON:  None Available. MEDICATIONS: 2 g Rocephin; The antibiotic was administered in an appropriate time frame prior to skin puncture. ANESTHESIA/SEDATION: Moderate (conscious) sedation was employed during this procedure. A total of Versed 1.5 mg and Fentanyl 0 mcg was administered intravenously by the radiology nurse. Total intra-service moderate Sedation Time: 11 minutes. The patient's level of consciousness and vital signs were monitored continuously by radiology nursing throughout the procedure under my direct supervision. CONTRAST:  20 mL Omnipaque 300-administered into the collecting system(s) FLUOROSCOPY: Radiation Exposure Index (as provided by the fluoroscopic device): 6 mGy Kerma COMPLICATIONS: None immediate. PROCEDURE: Informed written consent was obtained from the patient after a thorough discussion of the procedural risks, benefits and alternatives. All questions were addressed. Maximal Sterile Barrier Technique was utilized including caps, mask, sterile gowns, sterile gloves, sterile drape, hand hygiene and skin antiseptic. A timeout was performed prior to the initiation of the procedure. In a prone position on the IR table, the right flank region was prepped and draped in usual sterile fashion. Local anesthesia was achieved with 1 percent lidocaine. Small incision was made in the  patient's flank region. Chiba needle was advanced with ultrasound guidance into the lower pole collecting system. Under fluoroscopic guidance the stylet was removed and dilute contrast was injected demonstrating position within the renal collecting system. An 018 guidewire was then advanced into the renal pelvis under  fluoroscopic guidance. Access was exchanged for a low-profile nephrostomy sheath. Access was then exchanged over an 035 short Amplatz wire for a fascial dilator to dilate the tract. A 10 French pigtail catheter was then advanced over the guidewire and coiled within the renal pelvis. Urine obtained was black in color and sent for culture and sensitivity. Catheter locking mechanism engaged. Sterile dressing applied. Retention suture applied. Catheter connected to gravity drainage. IMPRESSION: Satisfactory placement of a 10 French percutaneous right-sided nephrostomy tube. Electronically Signed   By: Cordella Banner   On: 06/07/2024 16:55   US  RENAL Result Date: 06/06/2024 EXAM: US  Retroperitoneum Complete, Renal. CLINICAL HISTORY: AKI (acute kidney injury) 409830. TECHNIQUE: Real-time ultrasound of the retroperitoneum (complete) with image documentation. COMPARISON: None provided. FINDINGS: RIGHT KIDNEY: Right kidney measures 9.8 x 3.7 x 5.9 cm, with a volume of 111 ml. A 1.1 cm echogenic lesion is identified without significant vascularity. Severe right-sided hydronephrosis is seen. A right-sided stent is seen. No renal stone is visualized. LEFT KIDNEY: Left kidney measures 9.8 x 3.5 x 4.2 cm, with a volume of 75 ml. A 1.3 cm cyst is noted in the upper pole. No hydronephrosis is seen. BLADDER: Bladder is well distended. IMPRESSION: 1. Severe right-sided hydronephrosis. 2. Right ureteral stent in place. 3. Echogenic right renal lesion measuring 1.1 cm without internal vascularity; indeterminate. Recommend nonemergent MRI follow-up 4. Simple cyst in the upper pole of the left kidney measuring 1.3 cm. Electronically signed by: Oneil Devonshire MD 06/06/2024 03:03 PM EDT RP Workstation: [id number]    Labs:  CBC: Recent Labs    06/06/24 1450 06/07/24 0618 06/08/24 0344 06/08/24 0706 06/09/24 0435 06/09/24 0557  WBC 7.3 6.2 6.6  --  5.6  --   HGB 7.3* 7.3* 6.9* 7.3* 6.7* 6.5*  HCT 23.5* 22.3* 22.0* 23.4*  21.4* 20.4*  PLT 143* 124* 127*  --  125*  --     COAGS: Recent Labs    05/28/24 0639  INR 1.3*  APTT 34    BMP: Recent Labs    06/06/24 0341 06/07/24 0618 06/08/24 0344 06/09/24 0435  NA 136 135 135 135  K 5.3* 4.5 4.5 4.6  CL 105 101 104 103  CO2 22 24 22 22   GLUCOSE 95 93 104* 93  BUN 30* 33* 41* 41*  CALCIUM 8.2* 8.2* 7.8* 8.4*  CREATININE 2.18* 2.34* 2.31* 2.08*  GFRNONAA 30* 28* 28* 32*    LIVER FUNCTION TESTS: Recent Labs    05/28/24 0639 06/05/24 1834 06/06/24 0341 06/06/24 1450 06/07/24 0618 06/08/24 0344 06/09/24 0435  BILITOT 1.0 1.5* 1.2 1.1 1.0  --   --   AST 29 27 23   --  17  --   --   ALT 10 19 18   --  14  --   --   ALKPHOS 3,174* 3,465* 3,173*  --  2,905*  --   --   PROT 5.8* 6.8 6.0*  --  6.1*  --   --   ALBUMIN 2.7* 3.3* 2.9*  --  2.9* 2.6* 2.4*    Assessment and Plan: Right hydronephrosis s/p right nephrostomy tube placement 10/17 by Dr. Banner Patient alert, sitting up in bed.  Denies pain or complaint related to drain.  Bloody drainage with sediment but no discernable clotting noted in gravity bag.  Hgb was 7.3 on day of placement, now 6.5. Reviewed with Dr. Karalee who recommends repeat CT Abd Pelv w/o to ensure tube in good position. If out of position, will need to set for exchange.   CT ordered.  Continue routine drain care with regular flushes for now as these are not currently painful for him.   Electronically Signed: Zachry Hopfensperger Sue-Ellen Kennette Cuthrell, PA 06/09/2024, 1:18 PM   I spent a total of 15 Minutes at the the patient's bedside AND on the patient's [facility] floor or unit, greater than 50% of which was counseling/coordinating care for right hydronephrosis, metastatic prost\ate cancer.

## 2024-06-09 NOTE — Plan of Care (Signed)
   Problem: Clinical Measurements: Goal: Ability to maintain clinical measurements within normal limits will improve Outcome: Progressing Goal: Will remain free from infection Outcome: Progressing   Problem: Nutrition: Goal: Adequate nutrition will be maintained Outcome: Progressing

## 2024-06-09 NOTE — Progress Notes (Signed)
 Progress Note   Patient: [facility] FMW:[id number] DOB: Aug 08, 1946 DOA: 06/05/2024     2 DOS: the patient was seen and examined on 06/09/2024 at 8:49AM      Brief hospital course: 78 y.o. 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            Assessment and Plan: *Hyperkalemia Resolved.  Some consideration of TLS. 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  Relatively low UOP in the PCN.  Drainage is fairly bloody. - CT abdomen to verify positioning - Plan to dc with PCN if correctly positioned, to be removed by outpatient Urology or IR   Normocytic anemia Hgb 7.8 on admission, trended down prior to PCN.  No GI bleeding observed or reported.  During previous hospitalizations, this was considered due to myelosuppression, probably also renal disease contributing.  He required multiple transfusion during August admission for hip fracture Today, Hgb <7 - Transfuse 2u - Consult Hematology   Essential hypertension Blood pressure soft - Transfuse blood - Hold Coreg and Imdur  Prostate cancer metastatic to bone [facility]) Diagnosed this past August.  Recent inadvertent DHEA use obtained on [employer] per [facility] (was trying to take [facility] for memory).  Started Casodex at that time, has had Eligard x2 (first dose 9/11, second dose 10/15) - Consult Oncology  -Continue Nubeqa   Chronic combined systolic and diastolic CHF (congestive heart failure) (HCC) Appears euvolemic.  First diagnosed with  Cardiomyopathy in 2021 in setting of Guillain barre?  But I see no cardiology follow up since then.    During recent admission two weeks ago for flash pulmonary edema and Takotsubo CM, noted to have EF down to 30-35%.  Started on BB and Imdur then, I see no cardiology follow up plans.  Still appears euvolemic.  Only uses furosemide as needed - Hold furosemide, Coreg, Imdur   Paroxysmal atrial fibrillation (HCC) Diagnosed in 2021 in setting of Guillain barre and associated PE.  On Eliquis for a time, now off. - Hold Coreg          Subjective: Frustrated, but no new concerns.  Has bloody drainage from PCN.  No syncope, no shortness of breath.     Physical Exam: BP (!) 100/57   Pulse (!) 57   Temp 97.9 F (36.6 C)   Resp 17   Ht 5' 9 (1.753 m)   Wt 67.6 kg   SpO2 96%   BMI 22.01 kg/m   Thin adult male, sitting in bed, interactive, appears at baseline RRR, no murmurs, no peripheral edema Respiratory normal, lungs clear without rales or wheezes Abdomen soft, no tenderness palpation or guarding, no ascites or distention Attention normal, affect blunted, judgment and insight appear at baseline, upper extremity strength normal    Data Reviewed: Discussed with interventional radiology, nephrology, and oncology Basic metabolic panel shows creatinine down to 2.08, potassium normal CBC shows hemoglobin down to 6.5, platelets stable at 125    Family Communication: None present    Disposition: Status is: Inpatient  Author: Lonni SHAUNNA Dalton, MD 06/09/2024 1:39 PM  For on call review [email or web address].

## 2024-06-09 NOTE — Plan of Care (Signed)
   Problem: Health Behavior/Discharge Planning: Goal: Ability to manage health-related needs will improve Outcome: Progressing

## 2024-06-09 NOTE — Discharge Instructions (Addendum)
 Drain Care:   1. Flush drain once daily with at least 5 ml NS. 2. Change the dressing daily or as needed. 3. Keep the site clean and dry.  4. Empty the bag as needed and document the date/time/volume.  Ok to shower but the site must be covered with an occlusive dressing.

## 2024-06-09 NOTE — Progress Notes (Signed)
 Joseph Mayo   DOB:[birth year]   FM#:[id number]   RDW#:[id number]  Subjective:   Pt is by himself, he appears very disinterested in the conversation. Affect is flat, didn't seem to have much interest. When asked if he is depressed, he said he is discouraged since everything keeps going on and on.  He isnt aware of his primary oncologist's name at the [facility], says this is all new to him No bleeding per urine.   Objective:  Vitals:   06/09/24 0442 06/09/24 0926  BP: (!) 98/45 (!) 100/57  Pulse: 62 (!) 57  Resp: 18 17  Temp: 98.6 F (37 C) 97.9 F (36.6 C)  SpO2: 100% 96%    Body mass index is 22.01 kg/m.  Intake/Output Summary (Last 24 hours) at 06/09/2024 1154 Last data filed at 06/09/2024 0545 Gross per 24 hour  Intake 10 ml  Output 350 ml  Net -340 ml     He appears well, no distress            Pallor noted.  CBG (last 3)  No results for input(s): GLUCAP in the last 72 hours.   Labs:  Lab Results  Component Value Date   WBC 5.6 06/09/2024   HGB 6.5 (LL) 06/09/2024   HCT 20.4 (L) 06/09/2024   MCV 99.5 06/09/2024   PLT 125 (L) 06/09/2024   NEUTROABS 2.9 06/07/2024    @LASTCHEMISTRY @  Urine Studies No results for input(s): UHGB, CRYS in the last 72 hours.  Invalid input(s): UACOL, UAPR, USPG, UPH, UTP, UGL, [place], UBIL, UNIT, UROB, [place], UEPI, UWBC, CORINN NUMBERS [place], [place], [place]  Basic Metabolic Panel: Recent Labs  Lab 06/05/24 1834 06/06/24 0341 06/06/24 1531 06/07/24 0618 06/08/24 0344 06/09/24 0435  NA 134* 136  --  135 135 135  K 6.3* 5.3*  --  4.5 4.5 4.6  CL 104 105  --  101 104 103  CO2 22 22  --  24 22 22   GLUCOSE 113* 95  --  93 104* 93  BUN 31* 30*  --  33* 41* 41*  CREATININE 2.12* 2.18*  --  2.34* 2.31* 2.08*  CALCIUM 8.6* 8.2*  --  8.2* 7.8* 8.4*  MG  --   --  2.4 2.4 2.4 2.3  PHOS  --   --  3.2 3.5 3.3 3.7   GFR Estimated Creatinine Clearance: 28 mL/min (A) (by C-G formula based on SCr of  2.08 mg/dL (H)). Liver Function Tests: Recent Labs  Lab 06/05/24 1834 06/06/24 0341 06/06/24 1450 06/07/24 0618 06/08/24 0344 06/09/24 0435  AST 27 23  --  17  --   --   ALT 19 18  --  14  --   --   ALKPHOS 3,465* 3,173*  --  2,905*  --   --   BILITOT 1.5* 1.2 1.1 1.0  --   --   PROT 6.8 6.0*  --  6.1*  --   --   ALBUMIN 3.3* 2.9*  --  2.9* 2.6* 2.4*   Recent Labs  Lab 06/05/24 1834  LIPASE 28   No results for input(s): AMMONIA in the last 168 hours. Coagulation profile No results for input(s): INR, PROTIME in the last 168 hours.  CBC: Recent Labs  Lab 06/06/24 0341 06/06/24 1450 06/07/24 0618 06/08/24 0344 06/08/24 0706 06/09/24 0435 06/09/24 0557  WBC 7.1 7.3 6.2 6.6  --  5.6  --   NEUTROABS  --  3.5 2.9  --   --   --   --  HGB 7.0* 7.3* 7.3* 6.9* 7.3* 6.7* 6.5*  HCT 22.0* 23.5* 22.3* 22.0* 23.4* 21.4* 20.4*  MCV 99.1 101.7* 97.0 99.5  --  99.5  --   PLT 120* 143* 124* 127*  --  125*  --    Cardiac Enzymes: No results for input(s): CKTOTAL, CKMB, CKMBINDEX, TROPONINI in the last 168 hours. BNP: Invalid input(s): POCBNP CBG: Recent Labs  Lab 06/06/24 0009  GLUCAP 102*   D-Dimer No results for input(s): DDIMER in the last 72 hours. Hgb A1c No results for input(s): HGBA1C in the last 72 hours. Lipid Profile No results for input(s): CHOL, HDL, LDLCALC, TRIG, CHOLHDL, LDLDIRECT in the last 72 hours. Thyroid function studies No results for input(s): TSH, T4TOTAL, T3FREE, THYROIDAB in the last 72 hours.  Invalid input(s): FREET3 Anemia work up Recent Labs    06/06/24 1450  RETICCTPCT 2.8   Microbiology Recent Results (from the past 240 hours)  Aerobic/Anaerobic Culture w Gram Stain (surgical/deep wound)     Status: None (Preliminary result)   Collection Time: 06/07/24  4:30 PM   Specimen: Urine  Result Value Ref Range Status   Specimen Description URINE, CATHETERIZED  Final   Special Requests NEPHROSTOMY  URINE SYRINGE  Final   Gram Stain   Final    RARE WBC PRESENT, PREDOMINANTLY PMN NO ORGANISMS SEEN    Culture   Final    NO GROWTH 2 DAYS Performed at [facility], [address], [place], [place] [address]    Report Status PENDING  Incomplete      Studies:  IR NEPHROSTOMY PLACEMENT RIGHT Result Date: 06/07/2024 INDICATION: Right-sided hydronephrosis. Obstruction of the right ureter. Indwelling ureteral stent not controlling hydronephrosis and worsening renal function. EXAM: Percutaneous nephrostomy tube placement COMPARISON:  None Available. MEDICATIONS: 2 g Rocephin; The antibiotic was administered in an appropriate time frame prior to skin puncture. ANESTHESIA/SEDATION: Moderate (conscious) sedation was employed during this procedure. A total of Versed 1.5 mg and Fentanyl 0 mcg was administered intravenously by the radiology nurse. Total intra-service moderate Sedation Time: 11 minutes. The patient's level of consciousness and vital signs were monitored continuously by radiology nursing throughout the procedure under my direct supervision. CONTRAST:  20 mL Omnipaque 300-administered into the collecting system(s) FLUOROSCOPY: Radiation Exposure Index (as provided by the fluoroscopic device): 6 mGy Kerma COMPLICATIONS: None immediate. PROCEDURE: Informed written consent was obtained from the patient after a thorough discussion of the procedural risks, benefits and alternatives. All questions were addressed. Maximal Sterile Barrier Technique was utilized including caps, mask, sterile gowns, sterile gloves, sterile drape, hand hygiene and skin antiseptic. A timeout was performed prior to the initiation of the procedure. In a prone position on the IR table, the right flank region was prepped and draped in usual sterile fashion. Local anesthesia was achieved with 1 percent lidocaine. Small incision was made in the patient's flank region. Chiba needle was advanced with ultrasound guidance into  the lower pole collecting system. Under fluoroscopic guidance the stylet was removed and dilute contrast was injected demonstrating position within the renal collecting system. An 018 guidewire was then advanced into the renal pelvis under fluoroscopic guidance. Access was exchanged for a low-profile nephrostomy sheath. Access was then exchanged over an 035 short Amplatz wire for a fascial dilator to dilate the tract. A 10 French pigtail catheter was then advanced over the guidewire and coiled within the renal pelvis. Urine obtained was black in color and sent for culture and sensitivity. Catheter locking mechanism engaged. Sterile  dressing applied. Retention suture applied. Catheter connected to gravity drainage. IMPRESSION: Satisfactory placement of a 10 French percutaneous right-sided nephrostomy tube. Electronically Signed   By: Cordella Banner   On: 06/07/2024 16:55    Assessment/Plan  This is a pt with PMH of metastatic prostate cancer, bone mets, last PSA 99 started on ADT, recent medication was Nubeqa referred to the hospital initially with severe hypokalemia. He had high uric acid level, high potassium and was recently started on treatment for met prostate cancer, hence although TLS is uncommon in solid tumors, it made sense to treat is as TLS He got fluids, one dose of rasburicase, labs significantly improved. Hyperkalemia resolved. Ok to use renal dose of allopurinol for prophylaxis. His [facility] team can discontinue if they deem this unnecessary  Then coming to anemia, he denies any blood in his stool or black stool.  Agree with doing FOBT, if ned, can be evaluated outpt Now this could be multifactorial including? Prostate cancer in the marrow as well as some anemia from kidney disease Agree with transfusion to maintain a Hb of 8. No evidence of hemolysis Ferritin high, likely acute phase reactant, B12 and folate added to labs. His MCV is close to macrocytosis, so could be defective production  as well. If nutritional def is ruled out, he can be considered for BMB outpt with his [facility] team  Discussed everything with Dr Jonel. Dr Jonel will discuss with nephrology as well and he may likely be Dc'ed after PRBC transfusion today He will follow up with [facility] team as scheduled.    Amber Stalls, MD 06/09/2024  11:54 AM

## 2024-06-10 ENCOUNTER — Other Ambulatory Visit (HOSPITAL_COMMUNITY): Payer: Self-pay

## 2024-06-10 DIAGNOSIS — E875 Hyperkalemia: Secondary | ICD-10-CM | POA: Diagnosis not present

## 2024-06-10 LAB — RENAL FUNCTION PANEL
Albumin: 2.4 g/dL — ABNORMAL LOW (ref 3.5–5.0)
Anion gap: 10 (ref 5–15)
BUN: 45 mg/dL — ABNORMAL HIGH (ref 8–23)
CO2: 22 mmol/L (ref 22–32)
Calcium: 8.3 mg/dL — ABNORMAL LOW (ref 8.9–10.3)
Chloride: 104 mmol/L (ref 98–111)
Creatinine, Ser: 1.9 mg/dL — ABNORMAL HIGH (ref 0.61–1.24)
GFR, Estimated: 36 mL/min — ABNORMAL LOW (ref >60)
Glucose, Bld: 96 mg/dL (ref 70–99)
Phosphorus: 3.5 mg/dL (ref 2.5–4.6)
Potassium: 4.8 mmol/L (ref 3.5–5.1)
Sodium: 136 mmol/L (ref 135–145)

## 2024-06-10 LAB — TYPE AND SCREEN
ABO/RH(D): A POS
Antibody Screen: NEGATIVE
Unit division: 0
Unit division: 0

## 2024-06-10 LAB — BPAM RBC
Blood Product Expiration Date: 202511072359
Blood Product Expiration Date: 202511122359
ISSUE DATE / TIME: 202510191353
ISSUE DATE / TIME: 202510191652
Unit Type and Rh: 6200
Unit Type and Rh: 6200

## 2024-06-10 LAB — CBC
HCT: 26.7 % — ABNORMAL LOW (ref 39.0–52.0)
Hemoglobin: 8.8 g/dL — ABNORMAL LOW (ref 13.0–17.0)
MCH: 30.4 pg (ref 26.0–34.0)
MCHC: 33 g/dL (ref 30.0–36.0)
MCV: 92.4 fL (ref 80.0–100.0)
Platelets: 118 K/uL — ABNORMAL LOW (ref 150–400)
RBC: 2.89 MIL/uL — ABNORMAL LOW (ref 4.22–5.81)
RDW: 18.6 % — ABNORMAL HIGH (ref 11.5–15.5)
WBC: 5.1 K/uL (ref 4.0–10.5)
nRBC: 0 % (ref 0.0–0.2)

## 2024-06-10 LAB — MAGNESIUM: Magnesium: 2.3 mg/dL (ref 1.7–2.4)

## 2024-06-10 LAB — OCCULT BLOOD X 1 CARD TO LAB, STOOL: Fecal Occult Bld: NEGATIVE

## 2024-06-10 MED ORDER — NORMAL SALINE FLUSH 0.9 % IV SOLN
10.0000 mL | Freq: Every day | INTRAVENOUS | 0 refills | Status: AC
  Filled 2024-06-10: qty 300, 30d supply, fill #0

## 2024-06-10 MED ORDER — ALLOPURINOL 100 MG PO TABS
100.0000 mg | ORAL_TABLET | Freq: Every day | ORAL | 0 refills | Status: AC
  Filled 2024-06-10: qty 30, 30d supply, fill #0

## 2024-06-10 NOTE — Discharge Summary (Signed)
 Physician Discharge Summary   Patient: Joseph Mayo MRN: [id number] DOB: 12/21/1945  Admit date:     06/05/2024  Discharge date: 06/10/24  Discharge Physician: Lonni SHAUNNA Dalton   PCP: [facility]     Recommendations at discharge:  Follow up with Nephrology Dr. Macel tomorrow for renal failure Follow [facility]  Follow up with [facility] as soon as able  [facility]: Please check CBC and BMP on Wednesday and transfuse as appropriate Please resume home carvedilol if BP allows/if appropriate Please discontinue allopurinol if appropriate (see below) Please refer for Cardiology follow up  [facility] Urology: Please monitor renal function and remove nephrostomy tube if appropriate Patient has follow up arranged with IR drain clinic for nephrostomy exchange in 8 weeks if needed (see below)     Discharge Diagnoses: Principal Problem:   Hyperkalemia due to renal failure and tumor lysis Active Problems:   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          * Hyperkalemia Admitted, found to have urate >14. Oncology consulted and administered Rasburicase x1.  Hyperkalemia and hyperuricemia normalized.   Ca and Phos remained  normal.  Discharged on allopurinol for TLS prevention per Oncology.  Recommend follow up with primary Oncologist.      Acute renal failure on CKD IIIb Creatinine at the [place] was 1.07 in Mar 2024, last known value.   Admitted 2 months ago to [facility] for hip fracture, found to have new renal insufficiency.  Creatinine seems to have stabilized at 1.8 after that.    Here, Creatinine 2.2 on admission, UA acellular, renal US  showed severe hydronephrosis despite stent.  Nephrology and Urology consulted, recommended PCN given failure likely in setting of extrinsic compression.  PCN placed by IR.  Noted bloody drainage, but interval improvement in Creatinine to 1.9 today.    Has Nephrology follow up tomorrow.  Defer continuation or discontinuation of PCN to Urology and Nephrology pending trajectory of creatinine.   He has referral to [facility] IR drain clinic for PCN management in 8 weeks for routine maintenance if needed   Prostate cancer metastatic to bone [facility]) - Follow up with Oncology  Normocytic anemia Hgb trended down to 6.5 and he was transfused 2 units.  Hgb 8.8 g/dL on discharge.    Chronic combined systolic and diastolic CHF (congestive heart failure) (HCC) Appears euvolemic.  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  But I see no cardiology follow up since then.    During recent admission two weeks ago for flash pulmonary edema and Takotsubo CM, noted to have EF down to 30-35%.  Started on BB and Imdur then, I see no cardiology follow up plans.  Appears euvolemic in the hospital.  Furosemide is only PRN.  Imdur stopped given soft BP.  Carvedilol held at discharge, needs close follow up.  Paroxysmal atrial fibrillation (HCC) Diagnosed in 2021 in setting of Guillain barre and associated PE.  On Eliquis for a time, now off.            The [place]  Controlled Substances Registry was reviewed for this patient prior to  discharge.  Consultants: Urology Dr. Elisabeth Nephrology Dr. Rayburn Oncology Dr. Loretha  Procedures performed: Percutaneous nephrostomy tube placement  Disposition: Home health Diet recommendation:  Discharge Diet Orders (From admission, onward)     Start     Ordered   06/10/24 0000  Diet - low sodium heart healthy        06/10/24 1511             DISCHARGE MEDICATION: Allergies as of 06/10/2024   No Known Allergies      Medication List     PAUSE taking these medications    calcitRIOL 0.25 MCG capsule Wait to take this until your doctor or other care provider tells you to start again. Commonly known as: ROCALTROL Take 0.25 mcg by mouth daily. Ask about: Should I take this medication?   calcium carbonate 500 MG chewable tablet Wait to take this until your doctor or other care provider tells you to start again. Commonly known as: TUMS - dosed in mg elemental calcium Chew 1,000 mg by mouth 2 (two) times daily. Ask about: Should I take this medication?   carvedilol 6.25 MG tablet Wait to take this until your doctor or other care provider tells you to start again. Commonly known as: COREG Take 1 tablet (6.25 mg total) by mouth 2 (two) times daily with a meal.       STOP taking these medications    isosorbide mononitrate 30 MG 24 hr tablet Commonly known as: IMDUR       TAKE these medications    allopurinol 100 MG tablet Commonly known as: ZYLOPRIM Take 1 tablet (100 mg total) by mouth daily. Start taking on: June 11, 2024   B-12 PO Take 1 tablet by mouth daily.   darolutamide 300 MG tablet Commonly known as: NUBEQA Take 600 mg by mouth 2 (two) times daily with a meal.   furosemide 20 MG tablet Commonly known as: Lasix Take 1 tablet (20 mg total) by mouth daily as needed.   Normal Saline Flush 0.9 % Soln Inject 10 mLs into the vein daily.   OMEGA 3 PO Take 1 capsule by mouth daily.   OVER THE COUNTER MEDICATION Take 1 Dose by mouth 2  (two) times daily. [place] Tail   QC TUMERIC COMPLEX PO Take 400 mg by mouth 2 (two) times daily.   saw palmetto 160 MG capsule Take 160 mg by mouth 2 (two) times daily.        Follow-up Information     [facility] Follow up.   Specialty: Home Health Services Why: Someone will call you to schedule resumption of care visit. Contact information: [address] [place] [address] [phone]         [facility] Follow up.   Why: Please follow up with IR in approximately 6-8 weeks for a routine PCN exchange if the PCN is not removed by Urology team. A scheduler from our clinic will call you with a date/time of your appointment. Please contact our team with questions/concerns prior to your visit. Contact information: [address]  [place] [address] [phone]         [facility].          [facility]          Macel Jayson PARAS, MD. Go in 1 day(s).   Specialty: Nephrology Contact information: [address] [place] [address] [phone]                 Discharge Instructions     Diet - low sodium heart healthy   Complete by: As directed    Discharge instructions   Complete by: As directed    **IMPORTANT DISCHARGE INSTRUCTIONS**   From Dr. 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 You may continue allopurinol 100 mg daily --> ask your oncologist if you should continue it  Your anemia was treated with blood transfusion Follow up with your hematologist this week Have labs checked with them   For the kidney disease, the stent made no impact on the renal function, implying it either failed or that the damage to the kidneys had gone on for long enough before  it was discovered in August To make sure we weren't leaving any possibilities for helping the kidneys, we placed the percutaneous nephrostomy tube  Flush this with sterile saline once daily to prevent it from clogging with clotted blood  Go see your Urologist asap.  Ask them if the nephrostomy tube should stay or be removed.  Your blood pressure here is on the low side, for that reason, hold your carvedilol until you see your Oncologist and ask them about it STOP the Imdur (because this can lower blood pressure, and the cardiac benefits of it aren't worth the risks of low blood pressure)   Discuss calcitriol and calcium carbonate with Dr. Macel, to see if you really need these.   Increase activity slowly   Complete by: As directed    No wound care   Complete by: As directed        Discharge Exam: Filed Weights   06/05/24 1829 06/05/24 2246 06/05/24 2320  Weight: 69.2 kg 68.1 kg 67.6 kg    General: Pt is alert, awake, not in acute distress Cardiovascular: RRR, nl S1-S2, no murmurs appreciated.   No LE edema.   Respiratory: Normal respiratory rate and rhythm.  CTAB without rales or wheezes. Abdominal: Abdomen soft and non-tender.  No distension or HSM.   Neuro/Psych: Strength symmetric in upper and lower extremities.  Judgment and insight appear normal.   Condition at discharge: fair  The results of significant diagnostics from this hospitalization (including imaging, microbiology, ancillary and laboratory) are listed below for reference.   Imaging Studies: CT ABDOMEN PELVIS WO CONTRAST Result Date: 06/09/2024 EXAM: CT ABDOMEN AND PELVIS WITHOUT CONTRAST 06/09/2024 09:39:37 PM TECHNIQUE: CT of the abdomen and pelvis was performed without the administration of intravenous contrast. Multiplanar reformatted images are provided for review. Automated exposure control, iterative reconstruction, and/or weight-based adjustment of the mA/kV was utilized to reduce the radiation dose to  as low as reasonably achievable. COMPARISON: None available. CLINICAL HISTORY: Hematuria, gross/macroscopic. FINDINGS: LOWER CHEST: Trace, left greater than right, pleural effusions. Bilateral lower lobe atelectasis. Right middle lobe subpleural 5 mm pulmonary nodule. Right lower lobe subpleural adjacent pulmonary nodules measuring up to 7 mm. Left lower lobe subpleural  4 mm pulmonary nodule. Coronary artery calcification. LIVER: Fluidense lesion of the right hepatic lobe likely represents a simple cyst. GALLBLADDER AND BILE DUCTS: Contracted gallbladder. No biliary ductal dilatation. SPLEEN: No acute abnormality. PANCREAS: Atrophic pancreas. ADRENAL GLANDS: No acute abnormality. KIDNEYS, URETERS AND BLADDER: Percutaneous right nephrostomy tube and right ureteral stent. These appear to be in an appropriate position. Extensive perinephric and periureteral right fat stranding. Suggestion of associated right urothelial thickening. Nonobstructive curvilinear 2 mm right nephrolithiasis. No left nephrolithiasis. No ureterolithiasis bilaterally. No bilateral hydroureteronephrosis. Urinary bladder is unremarkable. GI AND BOWEL: Stomach demonstrates no acute abnormality. No small or large bowel wall thickening or dilatation. Stool throughout the majority of the colon. Appendix is unremarkable. There is no bowel obstruction. PERITONEUM AND RETROPERITONEUM: No ascites. No free air. VASCULATURE: Aorta is normal in caliber. LYMPH NODES: No lymphadenopathy. REPRODUCTIVE ORGANS: Unremarkable prostate. BONES AND SOFT TISSUES: Diffuse sclerotic osseous lesions of the axial and appendicular skeleton. No focal soft tissue abnormality. IMPRESSION: 1. Percutaneous right nephrostomy tube and ureteral stent in appropriate position. 2. Marked right perinephric and peri-ureteral fat stranding with suggestion of underlying urothelial thickening - limited evaluation on this noncontrast study. Question superimposed infection. Correlate with  urinalysis. 3. Nonobstructive 2 mm right nephrolithiasis. 4. Diffuse sclerotic osseous lesions of the axial and appendicular skeleton. Question diffuse metastatic osseous lesions. Electronically signed by: Morgane Naveau MD 06/09/2024 10:07 PM EDT RP Workstation: [id number]   DG Chest Portable 1 View Addendum Date: 06/09/2024 ADDENDUM REPORT: 06/09/2024 22:04 ADDENDUM: Sclerotic heterogeneous appearance of the axial and appendicular skeleton. Electronically Signed   By: Morgane  Naveau M.D.   On: 06/09/2024 22:04   Result Date: 06/09/2024 CLINICAL DATA:  Hypoxia., . Pt states that he woke up and started having issues breathing. EXAM: PORTABLE CHEST 1 VIEW COMPARISON:  Chest x-ray 07/23/2020 FINDINGS: The heart and mediastinal contours are within normal limits. Diffuse patchy airspace and interstitial opacities. No pleural effusion. No pneumothorax. No acute osseous abnormality. IMPRESSION: Diffuse patchy airspace and interstitial opacities. Electronically Signed: By: Morgane  Naveau M.D. On: 05/28/2024 01:24   IR NEPHROSTOMY PLACEMENT RIGHT Result Date: 06/07/2024 INDICATION: Right-sided hydronephrosis. Obstruction of the right ureter. Indwelling ureteral stent not controlling hydronephrosis and worsening renal function. EXAM: Percutaneous nephrostomy tube placement COMPARISON:  None Available. MEDICATIONS: 2 g Rocephin; The antibiotic was administered in an appropriate time frame prior to skin puncture. ANESTHESIA/SEDATION: Moderate (conscious) sedation was employed during this procedure. A total of Versed 1.5 mg and Fentanyl 0 mcg was administered intravenously by the radiology nurse. Total intra-service moderate Sedation Time: 11 minutes. The patient's level of consciousness and vital signs were monitored continuously by radiology nursing throughout the procedure under my direct supervision. CONTRAST:  20 mL Omnipaque 300-administered into the collecting system(s) FLUOROSCOPY: Radiation Exposure Index  (as provided by the fluoroscopic device): 6 mGy Kerma COMPLICATIONS: None immediate. PROCEDURE: Informed written consent was obtained from the patient after a thorough discussion of the procedural risks, benefits and alternatives. All questions were addressed. Maximal Sterile Barrier Technique was utilized including caps, mask, sterile gowns, sterile gloves, sterile drape, hand hygiene and skin antiseptic. A timeout was performed prior to the initiation of the procedure. In a prone position on the IR table, the right flank region was prepped and draped in usual sterile fashion. Local anesthesia was achieved with 1 percent lidocaine. Small incision was made in the patient's flank region. Chiba needle was advanced with ultrasound guidance into the lower pole collecting system. Under fluoroscopic guidance the stylet was removed and  dilute contrast was injected demonstrating position within the renal collecting system. An 018 guidewire was then advanced into the renal pelvis under fluoroscopic guidance. Access was exchanged for a low-profile nephrostomy sheath. Access was then exchanged over an 035 short Amplatz wire for a fascial dilator to dilate the tract. A 10 French pigtail catheter was then advanced over the guidewire and coiled within the renal pelvis. Urine obtained was black in color and sent for culture and sensitivity. Catheter locking mechanism engaged. Sterile dressing applied. Retention suture applied. Catheter connected to gravity drainage. IMPRESSION: Satisfactory placement of a 10 French percutaneous right-sided nephrostomy tube. Electronically Signed   By: Cordella Banner   On: 06/07/2024 16:55   US  RENAL Result Date: 06/06/2024 EXAM: US  Retroperitoneum Complete, Renal. CLINICAL HISTORY: AKI (acute kidney injury) 409830. TECHNIQUE: Real-time ultrasound of the retroperitoneum (complete) with image documentation. COMPARISON: None provided. FINDINGS: RIGHT KIDNEY: Right kidney measures 9.8 x 3.7 x  5.9 cm, with a volume of 111 ml. A 1.1 cm echogenic lesion is identified without significant vascularity. Severe right-sided hydronephrosis is seen. A right-sided stent is seen. No renal stone is visualized. LEFT KIDNEY: Left kidney measures 9.8 x 3.5 x 4.2 cm, with a volume of 75 ml. A 1.3 cm cyst is noted in the upper pole. No hydronephrosis is seen. BLADDER: Bladder is well distended. IMPRESSION: 1. Severe right-sided hydronephrosis. 2. Right ureteral stent in place. 3. Echogenic right renal lesion measuring 1.1 cm without internal vascularity; indeterminate. Recommend nonemergent MRI follow-up 4. Simple cyst in the upper pole of the left kidney measuring 1.3 cm. Electronically signed by: Oneil Devonshire MD 06/06/2024 03:03 PM EDT RP Workstation: [id number]   ECHOCARDIOGRAM COMPLETE Result Date: 05/28/2024    ECHOCARDIOGRAM REPORT   Patient Name:   HANNIBAL SKALLA Date of Exam: 05/28/2024 Medical Rec #:  [id number]          Height:       69.0 in Accession #:    [id number]         Weight:       150.0 lb Date of Birth:  09-21-1945           BSA:          1.828 m Patient Age:    78 years           BP:           96/66 mmHg Patient Gender: M                  HR:           69 bpm. Exam Location:  Inpatient Procedure: 2D Echo, Cardiac Doppler, Color Doppler and Intracardiac            Opacification Agent (Both Spectral and Color Flow Doppler were            utilized during procedure). Indications:    CHF I50.31  History:        Patient has prior history of Echocardiogram examinations, most                 recent 07/21/2020. CHF, Arrythmias:Atrial Fibrillation;                 Signs/Symptoms:Shortness of Breath and Edema. H/O Metastatic                 prostate cancer.  Sonographer:    BERNARDA ROCKS Referring Phys: [id number] JUSTIN B HOWERTER IMPRESSIONS  1. Left ventricular ejection fraction, by estimation, is  30 to 35%. The left ventricle has moderately decreased function. The left ventricle demonstrates regional wall  motion abnormalities (see scoring diagram/findings for description). The left ventricular internal cavity size was moderately to severely dilated. Left ventricular diastolic parameters are consistent with Grade II diastolic dysfunction (pseudonormalization). There is anterior and septal wall akinesis with hypokinesis elsewhere.  2. Right ventricular systolic function is normal. The right ventricular size is normal.  3. Left atrial size was moderately dilated.  4. The mitral valve is normal in structure. Trivial mitral valve regurgitation.  5. The aortic valve is tricuspid. Aortic valve regurgitation is not visualized. No aortic stenosis is present. FINDINGS  Left Ventricle: Left ventricular ejection fraction, by estimation, is 30 to 35%. The left ventricle has moderately decreased function. The left ventricle demonstrates regional wall motion abnormalities. Definity contrast agent was given IV to delineate the left ventricular endocardial borders. The left ventricular internal cavity size was moderately to severely dilated. There is no left ventricular hypertrophy. Left ventricular diastolic parameters are consistent with Grade II diastolic dysfunction (pseudonormalization).  LV Wall Scoring: The entire anterior wall, entire septum, and apex are akinetic. The antero-lateral wall, mid and distal lateral wall, and entire inferior wall are hypokinetic. The basal inferolateral segment is normal. There is anterior and septal wall akinesis with hypokinesis elsewhere. Right Ventricle: The right ventricular size is normal. No increase in right ventricular wall thickness. Right ventricular systolic function is normal. Left Atrium: Left atrial size was moderately dilated. Right Atrium: Right atrial size was normal in size. Pericardium: There is no evidence of pericardial effusion. Mitral Valve: The mitral valve is normal in structure. Trivial mitral valve regurgitation. MV peak gradient, 2.1 mmHg. The mean mitral valve  gradient is 1.0 mmHg. Tricuspid Valve: The tricuspid valve is normal in structure. Tricuspid valve regurgitation is trivial. Aortic Valve: The aortic valve is tricuspid. Aortic valve regurgitation is not visualized. No aortic stenosis is present. Aortic valve mean gradient measures 3.0 mmHg. Aortic valve peak gradient measures 6.0 mmHg. Aortic valve area, by VTI measures 2.86 cm. Pulmonic Valve: The pulmonic valve was not well visualized. Pulmonic valve regurgitation is not visualized. Aorta: The aortic root and ascending aorta are structurally normal, with no evidence of dilitation. IAS/Shunts: No atrial level shunt detected by color flow Doppler.  LEFT VENTRICLE PLAX 2D LVIDd:         5.50 cm      Diastology LVIDs:         4.40 cm      LV e' medial:    6.53 cm/s LV PW:         0.80 cm      LV E/e' medial:  11.5 LV IVS:        0.90 cm      LV e' lateral:   6.74 cm/s LVOT diam:     2.10 cm      LV E/e' lateral: 11.2 LV SV:         55 LV SV Index:   30 LVOT Area:     3.46 cm  LV Volumes (MOD) LV vol d, MOD A2C: 226.0 ml LV vol d, MOD A4C: 181.0 ml LV vol s, MOD A2C: 159.0 ml LV vol s, MOD A4C: 113.0 ml LV SV MOD A2C:     67.0 ml LV SV MOD A4C:     181.0 ml LV SV MOD BP:      72.9 ml RIGHT VENTRICLE  IVC RV Basal diam:  3.10 cm     IVC diam: 1.90 cm RV S prime:     12.40 cm/s TAPSE (M-mode): 2.3 cm LEFT ATRIUM             Index        RIGHT ATRIUM           Index LA diam:        4.50 cm 2.46 cm/m   RA Area:     17.30 cm LA Vol (A2C):   68.7 ml 37.58 ml/m  RA Volume:   49.70 ml  27.18 ml/m LA Vol (A4C):   87.2 ml 47.70 ml/m LA Biplane Vol: 81.4 ml 44.52 ml/m  AORTIC VALVE                    PULMONIC VALVE AV Area (Vmax):    2.45 cm     PV Vmax:          0.68 m/s AV Area (Vmean):   2.62 cm     PV Peak grad:     1.9 mmHg AV Area (VTI):     2.86 cm     PR End Diast Vel: 1.54 msec AV Vmax:           122.00 cm/s AV Vmean:          72.900 cm/s AV VTI:            0.194 m AV Peak Grad:      6.0 mmHg AV  Mean Grad:      3.0 mmHg LVOT Vmax:         86.20 cm/s LVOT Vmean:        55.100 cm/s LVOT VTI:          0.160 m LVOT/AV VTI ratio: 0.82  AORTA Ao Root diam: 3.50 cm Ao Asc diam:  3.80 cm MITRAL VALVE MV Area (PHT): 3.23 cm    SHUNTS MV Area VTI:   2.50 cm    Systemic VTI:  0.16 m MV Peak grad:  2.1 mmHg    Systemic Diam: 2.10 cm MV Mean grad:  1.0 mmHg MV Vmax:       0.73 m/s MV Vmean:      46.7 cm/s MV Decel Time: 235 msec MR Peak grad: 32.3 mmHg MR Vmax:      284.00 cm/s MV E velocity: 75.40 cm/s MV A velocity: 53.10 cm/s MV E/A ratio:  1.42 Franck Azobou Tonleu Electronically signed by Joelle Cedars Tonleu Signature Date/Time: 05/28/2024/1:22:52 PM    Final     Microbiology: Results for orders placed or performed during the hospital encounter of 06/05/24  Aerobic/Anaerobic Culture w Gram Stain (surgical/deep wound)     Status: None (Preliminary result)   Collection Time: 06/07/24  4:30 PM   Specimen: Urine  Result Value Ref Range Status   Specimen Description URINE, CATHETERIZED  Final   Special Requests NEPHROSTOMY URINE SYRINGE  Final   Gram Stain   Final    RARE WBC PRESENT, PREDOMINANTLY PMN NO ORGANISMS SEEN    Culture   Final    NO GROWTH 3 DAYS Performed at [facility], [address], [place], [place] [address]    Report Status PENDING  Incomplete    Labs: CBC: Recent Labs  Lab 06/06/24 1450 06/07/24 0618 06/08/24 0344 06/08/24 0706 06/09/24 0435 06/09/24 0557 06/10/24 0614  WBC 7.3 6.2 6.6  --  5.6  --  5.1  NEUTROABS 3.5  2.9  --   --   --   --   --   HGB 7.3* 7.3* 6.9* 7.3* 6.7* 6.5* 8.8*  HCT 23.5* 22.3* 22.0* 23.4* 21.4* 20.4* 26.7*  MCV 101.7* 97.0 99.5  --  99.5  --  92.4  PLT 143* 124* 127*  --  125*  --  118*   Basic Metabolic Panel: Recent Labs  Lab 06/06/24 0341 06/06/24 1531 06/07/24 0618 06/08/24 0344 06/09/24 0435 06/10/24 0614  NA 136  --  135 135 135 136  K 5.3*  --  4.5 4.5 4.6 4.8  CL 105  --  101 104 103 104  CO2 22  --  24 22  22 22   GLUCOSE 95  --  93 104* 93 96  BUN 30*  --  33* 41* 41* 45*  CREATININE 2.18*  --  2.34* 2.31* 2.08* 1.90*  CALCIUM 8.2*  --  8.2* 7.8* 8.4* 8.3*  MG  --  2.4 2.4 2.4 2.3 2.3  PHOS  --  3.2 3.5 3.3 3.7 3.5   Liver Function Tests: Recent Labs  Lab 06/05/24 1834 06/06/24 0341 06/06/24 1450 06/07/24 0618 06/08/24 0344 06/09/24 0435 06/10/24 0614  AST 27 23  --  17  --   --   --   ALT 19 18  --  14  --   --   --   ALKPHOS 3,465* 3,173*  --  2,905*  --   --   --   BILITOT 1.5* 1.2 1.1 1.0  --   --   --   PROT 6.8 6.0*  --  6.1*  --   --   --   ALBUMIN 3.3* 2.9*  --  2.9* 2.6* 2.4* 2.4*   CBG: Recent Labs  Lab 06/06/24 0009  GLUCAP 102*    Discharge time spent: approximately 45 minutes spent on discharge counseling, evaluation of patient on day of discharge, and coordination of discharge planning with nursing, social work, pharmacy and case management  Signed: Lonni SHAUNNA Dalton, MD [facility] 06/10/2024

## 2024-06-10 NOTE — Progress Notes (Signed)
 Referring Physician(s): Lonni Dalton, MD  Supervising Physician: Vanice Revel  Patient Status:  [facility] - In-pt  Chief Complaint: right hydronephrosis s/p right PCN placement on 06/07/2024 by Dr Jenna   Subjective: Pt lying in bed. No complaints. Ready for discharge home. Seen with pt's daughter, [name], at bedside.   Allergies: Patient has no known allergies.  Medications: Prior to Admission medications   Medication Sig Start Date End Date Taking? Authorizing Provider  carvedilol (COREG) 6.25 MG tablet Take 1 tablet (6.25 mg total) by mouth 2 (two) times daily with a meal. 05/30/24  Yes Rashid, Farhan, MD  Cyanocobalamin (B-12 PO) Take 1 tablet by mouth daily.   Yes [provider]  darolutamide (NUBEQA) 300 MG tablet Take 600 mg by mouth 2 (two) times daily with a meal.   Yes [provider]  furosemide (LASIX) 20 MG tablet Take 1 tablet (20 mg total) by mouth daily as needed. 05/30/24 05/30/25 Yes Rashid, Farhan, MD  isosorbide mononitrate (IMDUR) 30 MG 24 hr tablet Take 0.5 tablets (15 mg total) by mouth daily. Patient taking differently: Take 30 mg by mouth daily. 05/30/24  Yes Dino Antu, MD  Omega-3 Fatty Acids (OMEGA 3 PO) Take 1 capsule by mouth daily.   Yes [provider]  OVER THE COUNTER MEDICATION Take 1 Dose by mouth 2 (two) times daily. [place]   Yes [provider]  saw palmetto 160 MG capsule Take 160 mg by mouth 2 (two) times daily.   Yes [provider]  Turmeric (QC TUMERIC COMPLEX PO) Take 400 mg by mouth 2 (two) times daily.   Yes [provider]     Vital Signs: BP (!) 105/47 (BP Location: Left Arm)   Pulse 61   Temp 98 F (36.7 C) (Oral)   Resp 18   Ht 5' 9 (1.753 m)   Wt 149 lb 0.5 oz (67.6 kg)   SpO2 100%   BMI 22.01 kg/m   Physical Exam Pulmonary:     Effort: Pulmonary effort is normal. No respiratory distress.  Skin:    General: Skin is warm and dry.     Comments:  Right  flank PCN drain in place Bloody drainage in gravity bag Dressing clean, dry, and intact Flushes easily Suture and state lock in place Insertion site unremarkable  Neurological:     Mental Status: He is alert and oriented to person, place, and time.  Psychiatric:        Mood and Affect: Mood normal.        Behavior: Behavior normal.     Imaging: CT ABDOMEN PELVIS WO CONTRAST Result Date: 06/09/2024 EXAM: CT ABDOMEN AND PELVIS WITHOUT CONTRAST 06/09/2024 09:39:37 PM TECHNIQUE: CT of the abdomen and pelvis was performed without the administration of intravenous contrast. Multiplanar reformatted images are provided for review. Automated exposure control, iterative reconstruction, and/or weight-based adjustment of the mA/kV was utilized to reduce the radiation dose to as low as reasonably achievable. COMPARISON: None available. CLINICAL HISTORY: Hematuria, gross/macroscopic. FINDINGS: LOWER CHEST: Trace, left greater than right, pleural effusions. Bilateral lower lobe atelectasis. Right middle lobe subpleural 5 mm pulmonary nodule. Right lower lobe subpleural adjacent pulmonary nodules measuring up to 7 mm. Left lower lobe subpleural 4 mm pulmonary nodule. Coronary artery calcification. LIVER: Fluidense lesion of the right hepatic lobe likely represents a simple cyst. GALLBLADDER AND BILE DUCTS: Contracted gallbladder. No biliary ductal dilatation. SPLEEN: No acute abnormality. PANCREAS: Atrophic pancreas. ADRENAL GLANDS: No acute abnormality. KIDNEYS, URETERS  AND BLADDER: Percutaneous right nephrostomy tube and right ureteral stent. These appear to be in an appropriate position. Extensive perinephric and periureteral right fat stranding. Suggestion of associated right urothelial thickening. Nonobstructive curvilinear 2 mm right nephrolithiasis. No left nephrolithiasis. No ureterolithiasis bilaterally. No bilateral hydroureteronephrosis. Urinary bladder is unremarkable. GI AND BOWEL: Stomach demonstrates  no acute abnormality. No small or large bowel wall thickening or dilatation. Stool throughout the majority of the colon. Appendix is unremarkable. There is no bowel obstruction. PERITONEUM AND RETROPERITONEUM: No ascites. No free air. VASCULATURE: Aorta is normal in caliber. LYMPH NODES: No lymphadenopathy. REPRODUCTIVE ORGANS: Unremarkable prostate. BONES AND SOFT TISSUES: Diffuse sclerotic osseous lesions of the axial and appendicular skeleton. No focal soft tissue abnormality. IMPRESSION: 1. Percutaneous right nephrostomy tube and ureteral stent in appropriate position. 2. Marked right perinephric and peri-ureteral fat stranding with suggestion of underlying urothelial thickening - limited evaluation on this noncontrast study. Question superimposed infection. Correlate with urinalysis. 3. Nonobstructive 2 mm right nephrolithiasis. 4. Diffuse sclerotic osseous lesions of the axial and appendicular skeleton. Question diffuse metastatic osseous lesions. Electronically signed by: Morgane Naveau MD 06/09/2024 10:07 PM EDT RP Workstation: [id number]   IR NEPHROSTOMY PLACEMENT RIGHT Result Date: 06/07/2024 INDICATION: Right-sided hydronephrosis. Obstruction of the right ureter. Indwelling ureteral stent not controlling hydronephrosis and worsening renal function. EXAM: Percutaneous nephrostomy tube placement COMPARISON:  None Available. MEDICATIONS: 2 g Rocephin; The antibiotic was administered in an appropriate time frame prior to skin puncture. ANESTHESIA/SEDATION: Moderate (conscious) sedation was employed during this procedure. A total of Versed 1.5 mg and Fentanyl 0 mcg was administered intravenously by the radiology nurse. Total intra-service moderate Sedation Time: 11 minutes. The patient's level of consciousness and vital signs were monitored continuously by radiology nursing throughout the procedure under my direct supervision. CONTRAST:  20 mL Omnipaque 300-administered into the collecting system(s)  FLUOROSCOPY: Radiation Exposure Index (as provided by the fluoroscopic device): 6 mGy Kerma COMPLICATIONS: None immediate. PROCEDURE: Informed written consent was obtained from the patient after a thorough discussion of the procedural risks, benefits and alternatives. All questions were addressed. Maximal Sterile Barrier Technique was utilized including caps, mask, sterile gowns, sterile gloves, sterile drape, hand hygiene and skin antiseptic. A timeout was performed prior to the initiation of the procedure. In a prone position on the IR table, the right flank region was prepped and draped in usual sterile fashion. Local anesthesia was achieved with 1 percent lidocaine. Small incision was made in the patient's flank region. Chiba needle was advanced with ultrasound guidance into the lower pole collecting system. Under fluoroscopic guidance the stylet was removed and dilute contrast was injected demonstrating position within the renal collecting system. An 018 guidewire was then advanced into the renal pelvis under fluoroscopic guidance. Access was exchanged for a low-profile nephrostomy sheath. Access was then exchanged over an 035 short Amplatz wire for a fascial dilator to dilate the tract. A 10 French pigtail catheter was then advanced over the guidewire and coiled within the renal pelvis. Urine obtained was black in color and sent for culture and sensitivity. Catheter locking mechanism engaged. Sterile dressing applied. Retention suture applied. Catheter connected to gravity drainage. IMPRESSION: Satisfactory placement of a 10 French percutaneous right-sided nephrostomy tube. Electronically Signed   By: Cordella Banner   On: 06/07/2024 16:55   US  RENAL Result Date: 06/06/2024 EXAM: US  Retroperitoneum Complete, Renal. CLINICAL HISTORY: AKI (acute kidney injury) 409830. TECHNIQUE: Real-time ultrasound of the retroperitoneum (complete) with image documentation. COMPARISON: None provided. FINDINGS: RIGHT  KIDNEY: Right  kidney measures 9.8 x 3.7 x 5.9 cm, with a volume of 111 ml. A 1.1 cm echogenic lesion is identified without significant vascularity. Severe right-sided hydronephrosis is seen. A right-sided stent is seen. No renal stone is visualized. LEFT KIDNEY: Left kidney measures 9.8 x 3.5 x 4.2 cm, with a volume of 75 ml. A 1.3 cm cyst is noted in the upper pole. No hydronephrosis is seen. BLADDER: Bladder is well distended. IMPRESSION: 1. Severe right-sided hydronephrosis. 2. Right ureteral stent in place. 3. Echogenic right renal lesion measuring 1.1 cm without internal vascularity; indeterminate. Recommend nonemergent MRI follow-up 4. Simple cyst in the upper pole of the left kidney measuring 1.3 cm. Electronically signed by: Oneil Devonshire MD 06/06/2024 03:03 PM EDT RP Workstation: [id number]    Labs:  CBC: Recent Labs    06/07/24 0618 06/08/24 0344 06/08/24 9293 06/09/24 0435 06/09/24 0557 06/10/24 0614  WBC 6.2 6.6  --  5.6  --  5.1  HGB 7.3* 6.9* 7.3* 6.7* 6.5* 8.8*  HCT 22.3* 22.0* 23.4* 21.4* 20.4* 26.7*  PLT 124* 127*  --  125*  --  118*    COAGS: Recent Labs    05/28/24 0639  INR 1.3*  APTT 34    BMP: Recent Labs    06/07/24 0618 06/08/24 0344 06/09/24 0435 06/10/24 0614  NA 135 135 135 136  K 4.5 4.5 4.6 4.8  CL 101 104 103 104  CO2 24 22 22 22   GLUCOSE 93 104* 93 96  BUN 33* 41* 41* 45*  CALCIUM 8.2* 7.8* 8.4* 8.3*  CREATININE 2.34* 2.31* 2.08* 1.90*  GFRNONAA 28* 28* 32* 36*    LIVER FUNCTION TESTS: Recent Labs    05/28/24 0639 06/05/24 1834 06/06/24 0341 06/06/24 1450 06/07/24 0618 06/08/24 0344 06/09/24 0435 06/10/24 0614  BILITOT 1.0 1.5* 1.2 1.1 1.0  --   --   --   AST 29 27 23   --  17  --   --   --   ALT 10 19 18   --  14  --   --   --   ALKPHOS 3,174* 3,465* 3,173*  --  2,905*  --   --   --   PROT 5.8* 6.8 6.0*  --  6.1*  --   --   --   ALBUMIN 2.7* 3.3* 2.9*  --  2.9* 2.6* 2.4* 2.4*    Assessment and Plan: Right  hydronephrosis- -Right PCN placement on 06/07/2024 by Dr Jenna -Bloody output noted yesterday 06/09/2024, CT done last night 06/09/2024 - confirmed correct placement of PCN, reported nonobstructive 2 mm right nephrolithiasis > continue flushes -2 units PRBC transfused yesterday per primary team, Hgb 8.8 today 06/10/2024   Drain Location: right nephrostomy tube Size: Fr size: 10 Fr Date of placement: 06/07/2024  Currently to: Drain collection device: gravity 24 hour output: 135 mL   Current examination: Flushes easily.  Insertion site unremarkable. Suture and stat lock in place. Dressed appropriately.   Plan: Continue TID flushes with 5 cc NS. Record output Q shift. Dressing changes QD or PRN if soiled.  Call IR APP or on call IR MD if difficulty flushing or sudden change in drain output.   Discharge planning: Please contact IR APP or on call IR MD prior to patient d/c to ensure appropriate follow up plans are in place. Typically patient will need nephrostomy exchange in 6-8 weeks. IR scheduler will contact patient with date/time of appointment. Record output QD, dressing changes every 2-3 days or earlier  if soiled.   Flush with 10 mLs intra catheter daily per Dr Vanice. Daughter verbalized understanding.   Patient will need ongoing urology follow up.    IR will continue to follow - please call with questions or concerns.    Electronically Signed: Oswald Pott B Danie Diehl, NP 06/10/2024, 10:57 AM   I spent a total of 25 Minutes at the the patient's bedside AND on the patient's hospital floor or unit, greater than 50% of which was counseling/coordinating care for right hydronephrosis.

## 2024-06-10 NOTE — Progress Notes (Signed)
 [facility] Progress Note    Assessment/ Plan:    AKI/CKD stage IV - most likely due to ongoing obstruction of the right kidney as well as IV lasix.  Hold lasix for now and continue to follow UOP and Scr.  Currently without uremic symptoms.  No indication for dialysis at this time.  No significant improvement after right PCN. Cr slowly improving toward his baseline of 1.8, down to 1.9. Avoid nephrotoxic medications including NSAIDs and iodinated intravenous contrast exposure unless the latter is absolutely indicated.  Preferred narcotic agents for pain control are hydromorphone, fentanyl, and methadone. Morphine should not be used. Avoid Baclofen and avoid oral sodium phosphate and magnesium citrate based laxatives / bowel preps. Continue strict Input and Output monitoring. Will monitor the patient closely with you and intervene or adjust therapy as indicated by changes in clinical status/labs    Hyperkalemia -resolved.  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  Oncology is following and was on rasburicase.  Off lokelma Severe right hydronephrosis - s/p IR placement of right percutaneous nephrostomy tube 06/07/24.  Urology following HTN - stable Metastatic prostate cancer - oncology following.  Casodex on hold for now and s/p 2 doses of Eligard (last dose 06/05/24) Normocytic anemia - Oncology following.  Transfuse as needed.  Thrombocytopenia - continue to follow.  Chronic combined systolic and diastolic CHF - hold lasix for now. P. Atrial fibrillation - per primary svc  Will sign off. Has first appt with Dr. Macel tomorrow as per daughter. Please call with any questions/concerns.  Ephriam Stank, MD [facility]   Subjective:   Patient seen and examined bedside. Daughter at bedside. She reports that he has an appointment with Dr. Macel tomorrow (first appt) No complaints.   Objective:   BP (!) 105/47 (BP Location: Left Arm)   Pulse 61   Temp  98 F (36.7 C) (Oral)   Resp 18   Ht 5' 9 (1.753 m)   Wt 67.6 kg   SpO2 100%   BMI 22.01 kg/m   Intake/Output Summary (Last 24 hours) at 06/10/2024 1131 Last data filed at 06/10/2024 1000 Gross per 24 hour  Intake 1030 ml  Output 1225 ml  Net -195 ml   Weight change:   Physical Exam: Gen: NAD CVS: RRR Resp: CTA BL Abd: soft Ext: no sig edema b/l Les Neuro: answers in one words, awake/alert  Imaging: CT ABDOMEN PELVIS WO CONTRAST Result Date: 06/09/2024 EXAM: CT ABDOMEN AND PELVIS WITHOUT CONTRAST 06/09/2024 09:39:37 PM TECHNIQUE: CT of the abdomen and pelvis was performed without the administration of intravenous contrast. Multiplanar reformatted images are provided for review. Automated exposure control, iterative reconstruction, and/or weight-based adjustment of the mA/kV was utilized to reduce the radiation dose to as low as reasonably achievable. COMPARISON: None available. CLINICAL HISTORY: Hematuria, gross/macroscopic. FINDINGS: LOWER CHEST: Trace, left greater than right, pleural effusions. Bilateral lower lobe atelectasis. Right middle lobe subpleural 5 mm pulmonary nodule. Right lower lobe subpleural adjacent pulmonary nodules measuring up to 7 mm. Left lower lobe subpleural 4 mm pulmonary nodule. Coronary artery calcification. LIVER: Fluidense lesion of the right hepatic lobe likely represents a simple cyst. GALLBLADDER AND BILE DUCTS: Contracted gallbladder. No biliary ductal dilatation. SPLEEN: No acute abnormality. PANCREAS: Atrophic pancreas. ADRENAL GLANDS: No acute abnormality. KIDNEYS, URETERS AND BLADDER: Percutaneous right nephrostomy tube and right ureteral stent. These appear to be in an appropriate position. Extensive perinephric and periureteral right fat stranding. Suggestion of associated right urothelial thickening. Nonobstructive  curvilinear 2 mm right nephrolithiasis. No left nephrolithiasis. No ureterolithiasis bilaterally. No bilateral  hydroureteronephrosis. Urinary bladder is unremarkable. GI AND BOWEL: Stomach demonstrates no acute abnormality. No small or large bowel wall thickening or dilatation. Stool throughout the majority of the colon. Appendix is unremarkable. There is no bowel obstruction. PERITONEUM AND RETROPERITONEUM: No ascites. No free air. VASCULATURE: Aorta is normal in caliber. LYMPH NODES: No lymphadenopathy. REPRODUCTIVE ORGANS: Unremarkable prostate. BONES AND SOFT TISSUES: Diffuse sclerotic osseous lesions of the axial and appendicular skeleton. No focal soft tissue abnormality. IMPRESSION: 1. Percutaneous right nephrostomy tube and ureteral stent in appropriate position. 2. Marked right perinephric and peri-ureteral fat stranding with suggestion of underlying urothelial thickening - limited evaluation on this noncontrast study. Question superimposed infection. Correlate with urinalysis. 3. Nonobstructive 2 mm right nephrolithiasis. 4. Diffuse sclerotic osseous lesions of the axial and appendicular skeleton. Question diffuse metastatic osseous lesions. Electronically signed by: Kate Plummer MD 06/09/2024 10:07 PM EDT RP Workstation: [id number]    Labs: BMET Recent Labs  Lab 06/05/24 1834 06/06/24 9658 06/06/24 1531 06/07/24 9381 06/08/24 0344 06/09/24 0435 06/10/24 0614  NA 134* 136  --  135 135 135 136  K 6.3* 5.3*  --  4.5 4.5 4.6 4.8  CL 104 105  --  101 104 103 104  CO2 22 22  --  24 22 22 22   GLUCOSE 113* 95  --  93 104* 93 96  BUN 31* 30*  --  33* 41* 41* 45*  CREATININE 2.12* 2.18*  --  2.34* 2.31* 2.08* 1.90*  CALCIUM 8.6* 8.2*  --  8.2* 7.8* 8.4* 8.3*  PHOS  --   --  3.2 3.5 3.3 3.7 3.5   CBC Recent Labs  Lab 06/06/24 1450 06/07/24 0618 06/08/24 0344 06/08/24 0706 06/09/24 0435 06/09/24 0557 06/10/24 0614  WBC 7.3 6.2 6.6  --  5.6  --  5.1  NEUTROABS 3.5 2.9  --   --   --   --   --   HGB 7.3* 7.3* 6.9* 7.3* 6.7* 6.5* 8.8*  HCT 23.5* 22.3* 22.0* 23.4* 21.4* 20.4* 26.7*  MCV  101.7* 97.0 99.5  --  99.5  --  92.4  PLT 143* 124* 127*  --  125*  --  118*    Medications:     allopurinol  100 mg Oral Daily   calcitRIOL  0.25 mcg Oral Daily   calcium carbonate  1,000 mg Oral BID WC   cyanocobalamin  1,000 mcg Oral Daily   darolutamide  600 mg Oral BID WC   omega-3 acid ethyl esters  1,000 mg Oral Daily   sodium chloride flush  5 mL Intracatheter Q8H      Ephriam Stank, MD [facility] 06/10/2024, 11:31 AM

## 2024-06-10 NOTE — TOC Transition Note (Signed)
 Transition of Care [facility]) - Discharge Note   Patient Details  Name: Joseph Mayo MRN: [id number] Date of Birth: 1945-12-17  Transition of Care [facility]) CM/SW Contact:  Tom-Johnson, Harvest Muskrat, RN Phone Number: 06/10/2024, 2:25 PM   Clinical Narrative:     Patient is scheduled for discharge today.  Readmission Risk Assessment done. Home health info, Outpatient f/u, hospital f/u and discharge instructions on AVS. Prescriptions sent to [facility] pharmacy and patient will receive meds prior discharge. Daughter, [name] at bedside and will transport at discharge.  No further ICM needs noted.       Final next level of care: Home w Home Health Services Barriers to Discharge: Barriers Resolved   Patient Goals and CMS Choice Patient states their goals for this hospitalization and ongoing recovery are:: To return home CMS Medicare.gov Compare Post Acute Care list provided to:: Patient Choice offered to / list presented to : Patient      Discharge Placement                Patient to be transferred to facility by: Daughter Name of family member notified: Rhonda    Discharge Plan and Services Additional resources added to the After Visit Summary for                  DME Arranged: N/A DME Agency: NA       HH Arranged: PT, OT, RN HH Agency: [facility] Date [facility] Agency Contacted: 06/06/24 Time HH Agency Contacted: 1300 Representative spoke with at [facility] Agency: Darleene  Social Drivers of Health (SDOH) Interventions SDOH Screenings   Food Insecurity: No Food Insecurity (06/05/2024)  Housing: Low Risk  (06/05/2024)  Transportation Needs: No Transportation Needs (06/05/2024)  Utilities: Not At Risk (06/05/2024)  Financial Resource Strain: High Risk (07/29/2020)   Received from [facility]  Social Connections: Moderately Isolated (06/05/2024)  Stress: No Stress Concern Present (04/25/2024)   Received from [facility]  Tobacco Use: Low Risk  (06/06/2024)      Readmission Risk Interventions    06/10/2024    2:23 PM  Readmission Risk Prevention Plan  Transportation Screening Complete  PCP or Specialist Appt within 3-5 Days Complete  HRI or Home Care Consult Complete  Social Work Consult for Recovery Care Planning/Counseling Complete  Palliative Care Screening Not Applicable  Medication Review Oceanographer) Referral to Pharmacy

## 2024-06-10 NOTE — Progress Notes (Addendum)
 Mobility Specialist Progress Note:    06/10/24 1135  Mobility  Activity Ambulated with assistance  Level of Assistance Contact guard assist, steadying assist  Assistive Device Front wheel walker  Distance Ambulated (ft) 215 ft  Activity Response Tolerated well  Mobility Referral Yes  Mobility visit 1 Mobility  Mobility Specialist Start Time (ACUTE ONLY) 1112  Mobility Specialist Stop Time (ACUTE ONLY) 1125  Mobility Specialist Time Calculation (min) (ACUTE ONLY) 13 min   Pt received standing at EOB w/ family eager for mobility. No c/o throughout. No physical assistance required just contact guard for safety and d/t foot drop. Returned to room w/o fault. Left in bed w/ call bell and personal belongings in reach. All needs met. Care team in room.  Thersia Minder Mobility Specialist  Please contact vis Secure Chat or  Rehab Office [phone]

## 2024-06-10 NOTE — Progress Notes (Signed)
 DISCHARGE NOTE HOME Joseph Mayo to be discharged Home per MD order. Discussed prescriptions and follow up appointments with the patient. Prescriptions given to patient; medication list explained in detail. Patient verbalized understanding.  Skin clean, dry and intact without evidence of skin break down, no evidence of skin tears noted. IV catheter discontinued intact. Site without signs and symptoms of complications. Dressing and pressure applied. Pt denies pain at the site currently. No complaints noted.  Daughter aware of nephrostomy care and flush. Home meds that were updated reviewed c daughter and pt  An After Visit Summary (AVS) was printed and given to the patient. Patient escorted via wheelchair, and discharged home via private auto.  Fidencio Duddy A Proctor-Gann, RN

## 2024-06-10 NOTE — Plan of Care (Signed)
  Problem: Education: Goal: Knowledge of General Education information will improve Description: Including pain rating scale, medication(s)/side effects and non-pharmacologic comfort measures Outcome: Adequate for Discharge   Problem: Health Behavior/Discharge Planning: Goal: Ability to manage health-related needs will improve Outcome: Adequate for Discharge   Problem: Clinical Measurements: Goal: Ability to maintain clinical measurements within normal limits will improve Outcome: Adequate for Discharge Goal: Will remain free from infection Outcome: Adequate for Discharge Goal: Diagnostic test results will improve Outcome: Adequate for Discharge Goal: Respiratory complications will improve Outcome: Adequate for Discharge Goal: Cardiovascular complication will be avoided Outcome: Adequate for Discharge   Problem: Activity: Goal: Risk for activity intolerance will decrease Outcome: Adequate for Discharge   Problem: Nutrition: Goal: Adequate nutrition will be maintained Outcome: Adequate for Discharge   Problem: Coping: Goal: Level of anxiety will decrease Outcome: Adequate for Discharge   

## 2024-06-10 NOTE — Progress Notes (Signed)
 Subjective: First time meeting patient.  He was accompanied by his daughter at bedside reviewed case and plan.  Daughter expressed understanding.  He provided primarily one-word responses.  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  PCNT is quite bloody but draining appropriately with slow but consistent interval improvement in serum creatinine.  Eligard and Nubeqa per [facility] urology  Right stent scheduled to be exchanged by [facility] urology, likely to be removed in favor of PCNT.  Okay to be discharged once medically clear.  Urology will sign off at this time.  Please call with questions.  Intake/Output from previous day: 10/19 0701 - 10/20 0700 In: 830 [Blood:820] Out: 1125 [Urine:925; Blood:200]  Intake/Output this shift: Total I/O In: 200 [P.O.:200] Out: 100 [Urine:100]  Physical Exam:  General: Alert and oriented CV: No cyanosis Lungs: equal chest rise Abdomen: Soft, NTND, no rebound or guarding Gu: Right PCN tube with bloody drainage  Lab Results: Recent Labs    06/09/24 0435 06/09/24 0557 06/10/24 0614  HGB 6.7* 6.5* 8.8*  HCT 21.4* 20.4* 26.7*   BMET Recent Labs    06/09/24 0435 06/09/24 0557 06/10/24 0614  NA 135  --  136  K 4.6  --  4.8  CL 103  --  104  CO2 22  --  22  GLUCOSE 93  --  96  BUN 41*  --  45*  CREATININE 2.08*  --  1.90*  CALCIUM 8.4*  --  8.3*  HGB 6.7* 6.5* 8.8*  WBC 5.6  --  5.1     Studies/Results: CT ABDOMEN PELVIS WO CONTRAST Result Date: 06/09/2024 EXAM: CT ABDOMEN AND PELVIS WITHOUT CONTRAST 06/09/2024  09:39:37 PM TECHNIQUE: CT of the abdomen and pelvis was performed without the administration of intravenous contrast. Multiplanar reformatted images are provided for review. Automated exposure control, iterative reconstruction, and/or weight-based adjustment of the mA/kV was utilized to reduce the radiation dose to as low as reasonably achievable. COMPARISON: None available. CLINICAL HISTORY: Hematuria, gross/macroscopic. FINDINGS: LOWER CHEST: Trace, left greater than right, pleural effusions. Bilateral lower lobe atelectasis. Right middle lobe subpleural 5 mm pulmonary nodule. Right lower lobe subpleural adjacent pulmonary nodules measuring up to 7 mm. Left lower lobe subpleural 4 mm pulmonary nodule. Coronary artery calcification. LIVER: Fluidense lesion of the right hepatic lobe likely represents a simple cyst. GALLBLADDER AND BILE DUCTS: Contracted gallbladder. No biliary ductal dilatation. SPLEEN: No acute abnormality. PANCREAS: Atrophic pancreas. ADRENAL GLANDS: No acute abnormality. KIDNEYS, URETERS AND BLADDER: Percutaneous right nephrostomy tube and right ureteral stent. These appear to be in an appropriate position. Extensive perinephric and periureteral right fat stranding. Suggestion of associated right urothelial thickening. Nonobstructive curvilinear 2 mm right nephrolithiasis. No left nephrolithiasis. No ureterolithiasis bilaterally. No bilateral hydroureteronephrosis. Urinary bladder is unremarkable. GI AND BOWEL: Stomach demonstrates no acute abnormality. No small or large bowel wall thickening or dilatation. Stool throughout the majority of the colon. Appendix is unremarkable. There is no bowel obstruction. PERITONEUM AND RETROPERITONEUM: No ascites. No free air. VASCULATURE: Aorta is normal in caliber. LYMPH NODES: No lymphadenopathy. REPRODUCTIVE ORGANS: Unremarkable prostate. BONES AND SOFT  TISSUES: Diffuse sclerotic osseous lesions of the axial and appendicular skeleton. No focal soft tissue  abnormality. IMPRESSION: 1. Percutaneous right nephrostomy tube and ureteral stent in appropriate position. 2. Marked right perinephric and peri-ureteral fat stranding with suggestion of underlying urothelial thickening - limited evaluation on this noncontrast study. Question superimposed infection. Correlate with urinalysis. 3. Nonobstructive 2 mm right nephrolithiasis. 4. Diffuse sclerotic osseous lesions of the axial and appendicular skeleton. Question diffuse metastatic osseous lesions. Electronically signed by: Morgane Naveau MD 06/09/2024 10:07 PM EDT RP Workstation: [id number]      LOS: 3 days   Ole Bourdon, NP [facility] Pager: [phone]  06/10/2024, 11:08 AM

## 2024-06-12 LAB — AEROBIC/ANAEROBIC CULTURE W GRAM STAIN (SURGICAL/DEEP WOUND): Culture: NO GROWTH

## 2024-06-24 ENCOUNTER — Encounter: Payer: Self-pay | Admitting: Radiology

## 2024-08-07 ENCOUNTER — Ambulatory Visit (HOSPITAL_COMMUNITY)

## 2024-08-08 ENCOUNTER — Other Ambulatory Visit (HOSPITAL_COMMUNITY): Payer: Self-pay | Admitting: Diagnostic Radiology

## 2024-08-08 ENCOUNTER — Ambulatory Visit (HOSPITAL_COMMUNITY)
Admission: RE | Admit: 2024-08-08 | Discharge: 2024-08-08 | Disposition: A | Discharge status: Home / Self Care | Source: Ambulatory Visit | Attending: Student | Admitting: Student

## 2024-08-08 DIAGNOSIS — C61 Malignant neoplasm of prostate: Secondary | ICD-10-CM | POA: Insufficient documentation

## 2024-08-08 DIAGNOSIS — N139 Obstructive and reflux uropathy, unspecified: Secondary | ICD-10-CM

## 2024-08-08 DIAGNOSIS — C7951 Secondary malignant neoplasm of bone: Secondary | ICD-10-CM | POA: Diagnosis not present

## 2024-08-08 HISTORY — PX: IR NEPHROSTOMY EXCHANGE RIGHT: IMG6070

## 2024-08-08 MED ORDER — IOHEXOL 300 MG/ML  SOLN
50.0000 mL | Freq: Once | INTRAMUSCULAR | Status: AC | PRN
  Administered 2024-08-08: 15:00:00 10 mL

## 2024-08-08 MED ORDER — LIDOCAINE HCL 1 % IJ SOLN
INTRAMUSCULAR | Status: AC
  Filled 2024-08-08: qty 20

## 2024-08-08 NOTE — Procedures (Signed)
 Interventional Radiology Procedure:   Indications: Prostate cancer. Right nephrostomy tube and needs routine exchange. Patient wants to have the tube evaluated for a capping trial.   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   Right ureter is patent.  New nephrostomy tube coiled in renal pelvis.   Complications: None     EBL: Minimal  Plan:  Continue with routine nephrostomy tube exchanges.   Kameron Blethen R. Philip, MD  Pager: [phone]

## 2024-08-08 NOTE — Progress Notes (Signed)
 Patient presents today for routine 8 week exchange of R PCN. H/o right ureteral obstruction 2/2 metastatic prostate cancer. Previously managed with ureteral stent, failed 2/2 extrinsic compression. A R PCN was placed 06/07/24. Stent was ultimately removed. Patient was discharged with directions to f/u with urology at [facility] and return to IR in 8 weeks for exchange if still in place.   Patient has followed up with urology as directed but was told by that provider that they will not manage PCN decision making. IR does not typically manage ongoing decision making; however, patient reports no output into drainage bag and tells me that he is urinating normally. Denies fever, chills, flank pain. Discussed with Dr. Philip. Reasonable to perform nephrostogram today and remove PCN if appropriate. Otherwise, exchange will be performed, and will reach out to urology to clarify their plans for additional imaging, etc.    Laymon Coast NP 08/08/2024 2:43 PM

## 2024-08-20 ENCOUNTER — Other Ambulatory Visit: Payer: Self-pay

## 2024-08-20 ENCOUNTER — Other Ambulatory Visit (HOSPITAL_COMMUNITY): Payer: Self-pay

## 2024-10-03 ENCOUNTER — Other Ambulatory Visit (HOSPITAL_COMMUNITY)
